# Patient Record
Sex: Male | Born: 1935
Health system: Southern US, Community
[De-identification: ages and names within clinical notes are randomized; demographics above are authoritative.]

## PROBLEM LIST (undated history)

## (undated) DIAGNOSIS — I10 Essential (primary) hypertension: Secondary | ICD-10-CM

## (undated) DIAGNOSIS — Z87442 Personal history of urinary calculi: Secondary | ICD-10-CM

## (undated) DIAGNOSIS — E782 Mixed hyperlipidemia: Secondary | ICD-10-CM

## (undated) DIAGNOSIS — Z45018 Encounter for adjustment and management of other part of cardiac pacemaker: Secondary | ICD-10-CM

## (undated) DIAGNOSIS — E785 Hyperlipidemia, unspecified: Secondary | ICD-10-CM

## (undated) DIAGNOSIS — R55 Syncope and collapse: Secondary | ICD-10-CM

## (undated) DIAGNOSIS — N189 Chronic kidney disease, unspecified: Secondary | ICD-10-CM

## (undated) DIAGNOSIS — R0989 Other specified symptoms and signs involving the circulatory and respiratory systems: Secondary | ICD-10-CM

## (undated) DIAGNOSIS — Z87891 Personal history of nicotine dependence: Secondary | ICD-10-CM

## (undated) DIAGNOSIS — I495 Sick sinus syndrome: Secondary | ICD-10-CM

## (undated) DIAGNOSIS — M109 Gout, unspecified: Secondary | ICD-10-CM

## (undated) DIAGNOSIS — Z8679 Personal history of other diseases of the circulatory system: Secondary | ICD-10-CM

## (undated) DIAGNOSIS — I5042 Chronic combined systolic (congestive) and diastolic (congestive) heart failure: Secondary | ICD-10-CM

## (undated) DIAGNOSIS — N402 Nodular prostate without lower urinary tract symptoms: Secondary | ICD-10-CM

## (undated) DIAGNOSIS — Z955 Presence of coronary angioplasty implant and graft: Secondary | ICD-10-CM

## (undated) DIAGNOSIS — I2510 Atherosclerotic heart disease of native coronary artery without angina pectoris: Secondary | ICD-10-CM

## (undated) DIAGNOSIS — F1011 Alcohol abuse, in remission: Secondary | ICD-10-CM

## (undated) DIAGNOSIS — K279 Peptic ulcer, site unspecified, unspecified as acute or chronic, without hemorrhage or perforation: Secondary | ICD-10-CM

## (undated) DIAGNOSIS — E039 Hypothyroidism, unspecified: Secondary | ICD-10-CM

## (undated) DIAGNOSIS — N400 Enlarged prostate without lower urinary tract symptoms: Secondary | ICD-10-CM

## (undated) DIAGNOSIS — E042 Nontoxic multinodular goiter: Secondary | ICD-10-CM

## (undated) DIAGNOSIS — D649 Anemia, unspecified: Secondary | ICD-10-CM

## (undated) HISTORY — DX: Hypothyroidism, unspecified: E03.9

## (undated) HISTORY — DX: Nodular prostate without lower urinary tract symptoms: N40.2

## (undated) HISTORY — PX: PENILE PROSTHESIS IMPLANT: SHX240

## (undated) HISTORY — PX: CORONARY ANGIOPLASTY: SHX604

## (undated) HISTORY — DX: Essential (primary) hypertension: I10

## (undated) HISTORY — DX: Encounter for adjustment and management of other part of cardiac pacemaker: Z45.018

## (undated) HISTORY — DX: Presence of coronary angioplasty implant and graft: Z95.5

## (undated) HISTORY — PX: THYROID SURGERY: SHX805

## (undated) HISTORY — DX: Anemia, unspecified: D64.9

## (undated) HISTORY — DX: Gout, unspecified: M10.9

## (undated) HISTORY — DX: Atherosclerotic heart disease of native coronary artery without angina pectoris: I25.10

## (undated) HISTORY — DX: Hyperlipidemia, unspecified: E78.5

## (undated) HISTORY — DX: Personal history of nicotine dependence: Z87.891

## (undated) HISTORY — DX: Chronic combined systolic (congestive) and diastolic (congestive) heart failure: I50.42

## (undated) HISTORY — DX: Mixed hyperlipidemia: E78.2

## (undated) HISTORY — DX: Peptic ulcer, site unspecified, unspecified as acute or chronic, without hemorrhage or perforation: K27.9

## (undated) HISTORY — PX: PACEMAKER IMPLANT: EP1218

## (undated) HISTORY — DX: Chronic kidney disease, unspecified: N18.9

## (undated) HISTORY — DX: Other specified symptoms and signs involving the circulatory and respiratory systems: R09.89

## (undated) HISTORY — DX: Personal history of urinary calculi: Z87.442

## (undated) HISTORY — DX: Personal history of other diseases of the circulatory system: Z86.79

## (undated) HISTORY — DX: Alcohol abuse, in remission: F10.11

## (undated) HISTORY — DX: Sick sinus syndrome: I49.5

## (undated) HISTORY — DX: Benign prostatic hyperplasia without lower urinary tract symptoms: N40.0

## (undated) HISTORY — DX: Syncope and collapse: R55

## (undated) HISTORY — DX: Nontoxic multinodular goiter: E04.2

---

## 2008-03-27 ENCOUNTER — Encounter: Admission: RE | Admit: 2008-03-27 | Discharge: 2008-03-27 | Payer: Self-pay | Admitting: Orthopaedic Surgery

## 2010-02-21 ENCOUNTER — Encounter: Payer: Self-pay | Admitting: Internal Medicine

## 2010-02-21 DIAGNOSIS — D649 Anemia, unspecified: Secondary | ICD-10-CM

## 2010-02-21 HISTORY — DX: Anemia, unspecified: D64.9

## 2010-02-24 ENCOUNTER — Ambulatory Visit: Payer: Self-pay | Admitting: Internal Medicine

## 2010-02-24 ENCOUNTER — Ambulatory Visit (HOSPITAL_COMMUNITY): Admission: RE | Admit: 2010-02-24 | Discharge: 2010-02-24 | Payer: Self-pay | Admitting: Internal Medicine

## 2010-02-25 ENCOUNTER — Encounter: Payer: Self-pay | Admitting: Internal Medicine

## 2010-04-02 ENCOUNTER — Ambulatory Visit: Payer: Self-pay | Admitting: Internal Medicine

## 2010-04-03 LAB — CONVERTED CEMR LAB
Basophils Relative: 0.7 % (ref 0.0–3.0)
Eosinophils Relative: 1.4 % (ref 0.0–5.0)
Hemoglobin: 12.6 g/dL — ABNORMAL LOW (ref 13.0–17.0)
Lymphocytes Relative: 20.5 % (ref 12.0–46.0)
MCHC: 34.5 g/dL (ref 30.0–36.0)
Monocytes Relative: 9.9 % (ref 3.0–12.0)
Neutro Abs: 3.9 10*3/uL (ref 1.4–7.7)
RBC: 4.19 M/uL — ABNORMAL LOW (ref 4.22–5.81)
WBC: 5.8 10*3/uL (ref 4.5–10.5)

## 2010-07-03 ENCOUNTER — Ambulatory Visit: Payer: Self-pay | Admitting: Internal Medicine

## 2010-07-08 ENCOUNTER — Telehealth: Payer: Self-pay | Admitting: Internal Medicine

## 2010-07-09 ENCOUNTER — Other Ambulatory Visit: Payer: Self-pay | Admitting: Internal Medicine

## 2010-07-09 LAB — CBC WITH DIFFERENTIAL/PLATELET
Basophils Absolute: 0 10*3/uL (ref 0.0–0.1)
Basophils Relative: 0.8 % (ref 0.0–3.0)
Eosinophils Absolute: 0.2 10*3/uL (ref 0.0–0.7)
Eosinophils Relative: 3 % (ref 0.0–5.0)
HCT: 33.7 % — ABNORMAL LOW (ref 39.0–52.0)
Hemoglobin: 11.5 g/dL — ABNORMAL LOW (ref 13.0–17.0)
Lymphocytes Relative: 18 % (ref 12.0–46.0)
Lymphs Abs: 1.1 10*3/uL (ref 0.7–4.0)
MCHC: 34.2 g/dL (ref 30.0–36.0)
MCV: 85.9 fl (ref 78.0–100.0)
Monocytes Absolute: 0.6 10*3/uL (ref 0.1–1.0)
Monocytes Relative: 10.1 % (ref 3.0–12.0)
Neutro Abs: 4 10*3/uL (ref 1.4–7.7)
Neutrophils Relative %: 68.1 % (ref 43.0–77.0)
Platelets: 187 10*3/uL (ref 150.0–400.0)
RBC: 3.92 Mil/uL — ABNORMAL LOW (ref 4.22–5.81)
RDW: 14.4 % (ref 11.5–14.6)
WBC: 5.9 10*3/uL (ref 4.5–10.5)

## 2010-07-09 LAB — GLUCOSE, RANDOM: Glucose, Bld: 87 mg/dL (ref 70–99)

## 2010-07-09 LAB — FERRITIN: Ferritin: 208.4 ng/mL (ref 22.0–322.0)

## 2010-07-09 LAB — POTASSIUM: Potassium: 4.5 mEq/L (ref 3.5–5.1)

## 2010-07-09 LAB — IBC PANEL
Iron: 74 ug/dL (ref 42–165)
Saturation Ratios: 22 % (ref 20.0–50.0)
Transferrin: 239.8 mg/dL (ref 212.0–360.0)

## 2010-07-10 LAB — MAGNESIUM: Magnesium: 1.6 mg/dL (ref 1.5–2.5)

## 2010-07-15 ENCOUNTER — Telehealth (INDEPENDENT_AMBULATORY_CARE_PROVIDER_SITE_OTHER): Payer: Self-pay | Admitting: *Deleted

## 2010-07-15 ENCOUNTER — Ambulatory Visit: Payer: Self-pay | Admitting: Oncology

## 2010-07-16 ENCOUNTER — Telehealth (INDEPENDENT_AMBULATORY_CARE_PROVIDER_SITE_OTHER): Payer: Self-pay | Admitting: *Deleted

## 2010-07-29 ENCOUNTER — Encounter: Payer: Self-pay | Admitting: Internal Medicine

## 2010-07-29 LAB — CBC WITH DIFFERENTIAL/PLATELET
BASO%: 0.7 % (ref 0.0–2.0)
Basophils Absolute: 0 10*3/uL (ref 0.0–0.1)
EOS%: 3.7 % (ref 0.0–7.0)
Eosinophils Absolute: 0.2 10*3/uL (ref 0.0–0.5)
HCT: 33.3 % — ABNORMAL LOW (ref 38.4–49.9)
HGB: 11.4 g/dL — ABNORMAL LOW (ref 13.0–17.1)
LYMPH%: 17.8 % (ref 14.0–49.0)
MCH: 29.1 pg (ref 27.2–33.4)
MCHC: 34.1 g/dL (ref 32.0–36.0)
MCV: 85.4 fL (ref 79.3–98.0)
MONO#: 0.5 10*3/uL (ref 0.1–0.9)
MONO%: 9.8 % (ref 0.0–14.0)
NEUT#: 3.5 10*3/uL (ref 1.5–6.5)
NEUT%: 68 % (ref 39.0–75.0)
Platelets: 180 10*3/uL (ref 140–400)
RBC: 3.9 10*6/uL — ABNORMAL LOW (ref 4.20–5.82)
RDW: 15.1 % — ABNORMAL HIGH (ref 11.0–14.6)
WBC: 5.1 10*3/uL (ref 4.0–10.3)
lymph#: 0.9 10*3/uL (ref 0.9–3.3)

## 2010-07-31 LAB — TESTOSTERONE: Testosterone: 269.91 ng/dL (ref 250–890)

## 2010-07-31 LAB — COMPREHENSIVE METABOLIC PANEL
ALT: 17 U/L (ref 0–53)
CO2: 23 mEq/L (ref 19–32)
Chloride: 104 mEq/L (ref 96–112)
Sodium: 140 mEq/L (ref 135–145)
Total Bilirubin: 0.5 mg/dL (ref 0.3–1.2)
Total Protein: 5.9 g/dL — ABNORMAL LOW (ref 6.0–8.3)

## 2010-07-31 LAB — SPEP & IFE WITH QIG
Albumin ELP: 60.1 % (ref 55.8–66.1)
Beta Globulin: 5.8 % (ref 4.7–7.2)
IgA: 180 mg/dL (ref 68–378)
Total Protein, Serum Electrophoresis: 5.9 g/dL — ABNORMAL LOW (ref 6.0–8.3)

## 2010-07-31 LAB — KAPPA/LAMBDA LIGHT CHAINS: Kappa free light chain: 1.47 mg/dL (ref 0.33–1.94)

## 2010-08-05 NOTE — Letter (Signed)
Summary: Patient Notice-Endo Biopsy Results  [facility]  [address] [place], [place] [address]   Phone: [phone]  Fax: [phone]        February 25, 2010 MRN: [id number]    Anthony Jensen [address] [place], [place]  [address]    Dear Mr. PAYETTE,  I am pleased to inform you that the biopsies taken during your recent endoscopic examination did not show any evidence of cancer upon pathologic examination.The biopsies of the small intestine showed normal villous pattern ( no evidence of villous atrophy/ sprue)  Additional information/recommendations:  __No further action is needed at this time.  Please follow-up with      your primary care physician for your other healthcare needs.  __ Please call [phone] to schedule a return visit to review      your condition.  _x_ Continue with the treatment plan as outlined on the day of your      exam.Continue to take the Iron and have Your CBC, Iron level and B12 level checked in  about 4-6 weeks.     Please call us if you are having persistent problems or have questions about your condition that have not been fully answered at this time.  Sincerely,  Hart Carwin MD  This letter has been electronically signed by your physician.  Appended Document: Patient Notice-Endo Biopsy Results letter mailed to patient's home

## 2010-08-05 NOTE — Procedures (Signed)
Summary: Colonoscopy  Patient: [name] Note: All result statuses are Final unless otherwise noted.  Tests: (1) Colonoscopy (COL)   COL Colonoscopy           DONE     [facility]     [address] [place], [place]  [address]           COLONOSCOPY PROCEDURE REPORT           PATIENT:  Anthony Jensen, Anthony Jensen  MR#:  [id number]     BIRTHDATE:  [birth year], 84 yrs. old  GENDER:  male     ENDOSCOPIST:  Hedwig Morton. Juanda Chance, MD     REF. BY:  Feliciana Rossetti, M.D.     PROCEDURE DATE:  02/24/2010     PROCEDURE:  Colonoscopy 19[address]     ASA CLASS:  Class II     INDICATIONS:  Anemia microcytic anemia     last colon 10 years ago,     MEDICATIONS:   Versed 4 mg, Fentanyl 87.5 mcg           DESCRIPTION OF PROCEDURE:   After the risks benefits and     alternatives of the procedure were thoroughly explained, informed     consent was obtained.  Digital rectal exam was performed and     revealed no rectal masses.   The  endoscope was introduced through     the anus and advanced to the cecum, which was identified by both     the appendix and ileocecal valve, without limitations.  The     quality of the prep was excellent, using MiraLax.  The instrument     was then slowly withdrawn as the colon was fully examined.     <<PROCEDUREIMAGES>>           FINDINGS:  Severe diverticulosis was found in the sigmoid colon     (see image1, image7, image6, and image8). severe spasm and     stenosis of the lumen, numerous diverticuli, difficult     exam,thickenrd haustral folds  Internal hemorrhoids were found     (see image9).  This was otherwise a normal examination of the     colon (see image5, image4, and image3).   Retroflexed views in the     rectum revealed no abnormalities.    The scope was then withdrawn     from the patient and the procedure completed.           COMPLICATIONS:  None     ENDOSCOPIC IMPRESSION:     1) Severe diverticulosis in the sigmoid colon     2) Internal hemorrhoids    3) Otherwise normal examination     nothing to0 account for the microcytic anemia     RECOMMENDATIONS:     1) high fiber diet     fiber supplements     Iron supplement x 4-6 weeks, then recheck CBC, Iron studies and     B12     REPEAT EXAM:  In 0 year(s) for.           ______________________________     Hedwig Morton. Juanda Chance, MD           CC:           n.     eSIGNED:   Hedwig Morton. Brodie at 02/24/2010 10:53 AM           Giovanni, Biby, [id number]  Note: An exclamation mark (!) indicates a result  that was not dispersed into the flowsheet. Document Creation Date: 02/24/2010 10:55 AM _______________________________________________________________________  (1) Order result status: Final Collection or observation date-time: 02/24/2010 10:45 Requested date-time:  Receipt date-time:  Reported date-time:  Referring Physician:   Ordering Physician: Lina Sar [id number]) Specimen Source:  Source: Launa Grill Order Number: [id number] Lab site:   Appended Document: Colonoscopy new lab orders entered for 03/31/10   Clinical Lists Changes  Medications: Added new medication of IRON 325 (65 FE) MG TABS (FERROUS SULFATE) 1 by mouth once daily

## 2010-08-05 NOTE — Procedures (Signed)
Summary: Upper Endoscopy  Patient: [name] Note: All result statuses are Final unless otherwise noted.  Tests: (1) Upper Endoscopy (EGD)   EGD Upper Endoscopy       DONE     [facility]     [address] [place], [place]  [address]           ENDOSCOPY PROCEDURE REPORT           PATIENT:  Anthony Jensen, Anthony Jensen  MR#:  [id number]     BIRTHDATE:  21-Feb-1926, 84 yrs. old  GENDER:  male           ENDOSCOPIST:  Hedwig Morton. Juanda Chance, MD     Referred by:  Feliciana Rossetti, M.D.           PROCEDURE DATE:  02/24/2010     PROCEDURE:  EGD with biopsy, Maloney Dilation of Esophagus     ASA CLASS:  Class II     INDICATIONS:  anemia microcytic anemia Hgb 11.4, s/p UGIB 07/2009     due to DU ( NSAID's)           MEDICATIONS:   Versed 3 mg, Fentanyl 37.5 mcg     TOPICAL ANESTHETIC:  Cetacaine Spray           DESCRIPTION OF PROCEDURE:   After the risks benefits and     alternatives of the procedure were thoroughly explained, informed     consent was obtained.  The  endoscope was introduced through the     mouth and advanced to the second portion of the duodenum, without     limitations.  The instrument was slowly withdrawn as the mucosa     was fully examined.     <<PROCEDUREIMAGES>>           A hiatal hernia was found. 1-2 cm small reducible hiatal hernia  A     stricture was found in the distal esophagus (see image1 and     image7). mild benign appearing fibrous stricture, admitted scope     without resistance maloney dilator 54F passed without difficulty     Otherwise the examination was normal. fundic gland polyps With     standard forceps, a biopsy was obtained and sent to pathology (see     image2, image3, image4, image5, and image6). small bowl biopsy to     r/o sprue    Retroflexed views revealed no abnormalities.    The     scope was then withdrawn from the patient and the procedure     completed.           COMPLICATIONS:  None           ENDOSCOPIC IMPRESSION:     1)  Hiatal hernia     2) Stricture in the distal esophagus     3) Otherwise normal examination     s/p passage of 54F Maloney dilator     RECOMMENDATIONS:     1) Await biopsy results     continue Prelosec prn           REPEAT EXAM:  In 0 year(s) for.           ______________________________     Hedwig Morton. Juanda Chance, MD           CC:           n.     eSIGNED:   Hedwig Morton. Brodie at 02/24/2010 10:44 AM  Baby, Stairs, [id number]  Note: An exclamation mark (!) indicates a result that was not dispersed into the flowsheet. Document Creation Date: 02/24/2010 10:46 AM _______________________________________________________________________  (1) Order result status: Final Collection or observation date-time: 02/24/2010 10:03 Requested date-time:  Receipt date-time:  Reported date-time:  Referring Physician:   Ordering Physician: Lina Sar [phone]) Specimen Source:  Source: Launa Grill Order Number: [id number] Lab site:

## 2010-08-05 NOTE — Procedures (Signed)
Summary: Instruction for procedure/[place]  Instruction for procedure/[place]   Imported By: Sherian Rein 02/25/2010 08:54:04  _____________________________________________________________________  External Attachment:    Type:   Image     Comment:   External Document

## 2010-08-05 NOTE — Letter (Signed)
Summary: [facility] Instructions  [facility]  [address] [place], [place] [address]   Phone: [phone]  Fax: [phone]       Anthony Jensen    1926/01/19    MRN: [id number]       Procedure Day /Date: Monday 02/24/10     Arrival Time: 8:30 am     Procedure Time: 9:30 am     Location of Procedure:                     _ x_  [facility] ( Outpatient Registration)  PREPARATION FOR COLONOSCOPY WITH MIRALAX  Starting 5 days prior to your procedure (beginning today) do not eat nuts, seeds, popcorn, corn, beans, peas,  salads, or any raw vegetables.  Do not take any fiber supplements (e.g. Metamucil, Citrucel, and Benefiber). ____________________________________________________________________________________________________   THE DAY BEFORE YOUR PROCEDURE         DATE: Sunday DAY:  02/23/10  1   Drink clear liquids the entire day-NO SOLID FOOD  2   Do not drink anything colored red or purple.  Avoid juices with pulp.  No orange juice.  3   Drink at least 64 oz. (8 glasses) of fluid/clear liquids during the day to prevent dehydration and help the prep work efficiently.  CLEAR LIQUIDS INCLUDE: Water Jello Ice Popsicles Tea (sugar ok, no milk/cream) Powdered fruit flavored drinks Coffee (sugar ok, no milk/cream) Gatorade Juice: apple, white grape, white cranberry  Lemonade Clear bullion, consomm, broth Carbonated beverages (any kind) Strained chicken noodle soup Hard Candy  4   Mix the entire bottle of Miralax with 64 oz. of Gatorade/Powerade in the morning and put in the refrigerator to chill.  5   At 3:00 pm take 2 Dulcolax/Bisacodyl tablets.  6   At 4:30 pm take one Reglan/Metoclopramide tablet.  7  Starting at 5:00 pm drink one 8 oz glass of the Miralax mixture every 15-20 minutes until you have finished drinking the entire 64 oz.  You should finish drinking prep around 7:30 or 8:00 pm.  8   If you are nauseated, you may take the 2nd  Reglan/Metoclopramide tablet at 6:30 pm.        9    At 8:00 pm take 2 more DULCOLAX/Bisacodyl tablets.         THE DAY OF YOUR PROCEDURE      DATE:  02/24/10 DAY: Monday  You may drink clear liquids until 5:30 am  (4 HOURS BEFORE PROCEDURE).   MEDICATION INSTRUCTIONS  Unless otherwise instructed, you should take regular prescription medications with a small sip of water as early as possible the morning of your procedure.  Discontinue Plavix beginning today per Dr Lina Sar       OTHER INSTRUCTIONS  You will need a responsible adult at least 75 years of age to accompany you and drive you home.   This person must remain in the waiting room during your procedure.  Wear loose fitting clothing that is easily removed.  Leave jewelry and other valuables at home.  However, you may wish to bring a book to read or an iPod/MP3 player to listen to music as you wait for your procedure to start.  Remove all body piercing jewelry and leave at home.  Total time from sign-in until discharge is approximately 2-3 hours.  You should go home directly after your procedure and rest.  You can resume normal activities the day after your procedure.  The  day of your procedure you should not:   Drive   Make legal decisions   Operate machinery   Drink alcohol   Return to work  You will receive specific instructions about eating, activities and medications before you leave.   The above instructions have been reviewed and explained to me by   Lamona Curl CMA Duncan Dull)  February 21, 2010 3:06 PM     I fully understand and can verbalize these instructions _____________________________ Date 02/21/10

## 2010-08-05 NOTE — Miscellaneous (Signed)
Summary: Meds  Clinical Lists Changes  Problems: Added new problem of ANEMIA (ICD-285.9) Medications: Added new medication of MIRALAX   POWD (POLYETHYLENE GLYCOL 3350) As per prep  instructions. - Signed Added new medication of REGLAN 10 MG  TABS (METOCLOPRAMIDE HCL) As per prep instructions. - Signed Added new medication of DULCOLAX 5 MG  TBEC (BISACODYL) Day before procedure take 2 at 3pm and 2 at 8pm. - Signed Rx of MIRALAX   POWD (POLYETHYLENE GLYCOL 3350) As per prep  instructions.;  #255gm x 0;  Signed;  Entered by: Lamona Curl CMA (AAMA);  Authorized by: Hart Carwin MD;  Method used: Electronically to [facility] [address] [place], [place], [place]  [id number], Ph: [phone], Fax: [phone] Rx of REGLAN 10 MG  TABS (METOCLOPRAMIDE HCL) As per prep instructions.;  #2 x 0;  Signed;  Entered by: Lamona Curl CMA (AAMA);  Authorized by: Hart Carwin MD;  Method used: Electronically to [facility] [address] [place], [place], [place]  [id number], Ph: [phone], Fax: [phone] Rx of DULCOLAX 5 MG  TBEC (BISACODYL) Day before procedure take 2 at 3pm and 2 at 8pm.;  #4 x 0;  Signed;  Entered by: Lamona Curl CMA (AAMA);  Authorized by: Hart Carwin MD;  Method used: Electronically to [facility] [address] [place], [place], [place]  [id number], Ph: [phone], Fax: [phone] Orders: Added new Test order of [facility] (Col/[facility] [place]) - Signed    Prescriptions: DULCOLAX 5 MG  TBEC (BISACODYL) Day before procedure take 2 at 3pm and 2 at 8pm.  #4 x 0   Entered by:   Lamona Curl CMA (AAMA)   Authorized by:   Hart Carwin MD   Signed by:   Lamona Curl CMA (AAMA) on 02/21/2010   Method used:   Electronically to        [facility] (retail)       [address]       [place], [place]  [id number]       Ph:  [phone]       Fax: [phone]   RxID:   [id number] REGLAN 10 MG  TABS (METOCLOPRAMIDE HCL) As per prep instructions.  #2 x 0   Entered by:   Lamona Curl CMA (AAMA)   Authorized by:   Hart Carwin MD   Signed by:   Lamona Curl CMA (AAMA) on 02/21/2010   Method used:   Electronically to        [facility] (retail)       [address]       [place], [place]  [address]       Ph: [phone]       Fax: [phone]   RxID:   [id number] MIRALAX   POWD (POLYETHYLENE GLYCOL 3350) As per prep  instructions.  #255gm x 0   Entered by:   Lamona Curl CMA (AAMA)   Authorized by:   Hart Carwin MD   Signed by:   Lamona Curl CMA (AAMA) on 02/21/2010   Method used:   Electronically to        [facility] (retail)       [address]       [place], [place]  [id number]       Ph: [phone]       Fax: [phone]   RxID:   [id number]

## 2010-08-07 NOTE — Progress Notes (Signed)
Summary: Labs  ---- Converted from flag ---- ---- 07/08/2010 8:15 AM, Lamona Curl CMA (AAMA) wrote: Dr Juanda Chance- I called patient to remind him to have a repeat h & h drawn.Marland Kitchen..Marland Kitchenwife requests that we add a magnesium level and glucose level as it was high at a recent appointment @ [facility]....are you okay with me going ahead and ordering the magnesium and glucose? ------------------------------ ---- 07/08/2010 8:48 AM, Hart Carwin MD wrote: yes it is OK  Appended Document: Labs Shanna from the lab called and said the pt wanted to add a Potassium draw to his labs.  I asked Dr. Juanda Chance and she said OK, she stated she already said ok for additional testing.

## 2010-08-07 NOTE — Progress Notes (Signed)
----   Converted from flag ---- ---- 07/12/2010 7:44 PM, Hart Carwin MD wrote: Rene Kocher, please forward my notes to Dr Arnoldo Lenis , especially all blood test results. Thanx ------------------------------  Phone Note Outgoing Call   Call placed by: Jesse Fall, RN Call placed to: [facility] Center Summary of Call: Spoke with [facility]. They have received the referral on patient and his medical records with labs. Dr. Truett Perna is reviewing and they will let us know when the patient is scheduled. Initial call taken by: Jesse Fall RN,  July 15, 2010 11:01 AM

## 2010-08-07 NOTE — Progress Notes (Signed)
  Phone Note Other Incoming   Caller: Chrys Racer at [facility] Summary of Call: Patient has been scheduled with Dr. Truett Perna on 07/29/10 at 1:30 PM.  Initial call taken by: Jesse Fall RN,  July 16, 2010 12:11 PM

## 2010-09-02 NOTE — Letter (Signed)
Summary: [facility]  [facility]   Imported By: Sherian Rein 08/25/2010 07:51:13  _____________________________________________________________________  External Attachment:    Type:   Image     Comment:   External Document

## 2010-11-07 ENCOUNTER — Other Ambulatory Visit: Payer: Self-pay | Admitting: Oncology

## 2010-11-07 ENCOUNTER — Encounter (HOSPITAL_BASED_OUTPATIENT_CLINIC_OR_DEPARTMENT_OTHER): Payer: Medicare Other | Admitting: Oncology

## 2010-11-07 DIAGNOSIS — N289 Disorder of kidney and ureter, unspecified: Secondary | ICD-10-CM

## 2010-11-07 DIAGNOSIS — D649 Anemia, unspecified: Secondary | ICD-10-CM

## 2010-11-07 LAB — CBC WITH DIFFERENTIAL/PLATELET
BASO%: 0.8 % (ref 0.0–2.0)
Basophils Absolute: 0 10*3/uL (ref 0.0–0.1)
EOS%: 3.4 % (ref 0.0–7.0)
HGB: 11.2 g/dL — ABNORMAL LOW (ref 13.0–17.1)
MCH: 28.4 pg (ref 27.2–33.4)
MCHC: 34.1 g/dL (ref 32.0–36.0)
MCV: 83.4 fL (ref 79.3–98.0)
MONO%: 9.3 % (ref 0.0–14.0)
RBC: 3.95 10*6/uL — ABNORMAL LOW (ref 4.20–5.82)
RDW: 14.4 % (ref 11.0–14.6)
lymph#: 1.2 10*3/uL (ref 0.9–3.3)

## 2010-12-11 ENCOUNTER — Encounter (HOSPITAL_BASED_OUTPATIENT_CLINIC_OR_DEPARTMENT_OTHER): Payer: Medicare Other | Admitting: Oncology

## 2010-12-11 ENCOUNTER — Other Ambulatory Visit: Payer: Self-pay | Admitting: Oncology

## 2010-12-11 DIAGNOSIS — D649 Anemia, unspecified: Secondary | ICD-10-CM

## 2010-12-11 LAB — CBC & DIFF AND RETIC
BASO%: 0.3 % (ref 0.0–2.0)
Basophils Absolute: 0 10e3/uL (ref 0.0–0.1)
EOS%: 2.6 % (ref 0.0–7.0)
Eosinophils Absolute: 0.2 10e3/uL (ref 0.0–0.5)
HCT: 29.2 % — ABNORMAL LOW (ref 38.4–49.9)
HGB: 9.8 g/dL — ABNORMAL LOW (ref 13.0–17.1)
Immature Retic Fract: 3.6 % (ref 0.00–13.40)
LYMPH%: 22.5 % (ref 14.0–49.0)
MCH: 27.5 pg (ref 27.2–33.4)
MCHC: 33.6 g/dL (ref 32.0–36.0)
MCV: 82 fL (ref 79.3–98.0)
MONO#: 0.5 10e3/uL (ref 0.1–0.9)
MONO%: 7.7 % (ref 0.0–14.0)
NEUT#: 4.6 10e3/uL (ref 1.5–6.5)
NEUT%: 66.9 % (ref 39.0–75.0)
Platelets: 140 10e3/uL (ref 140–400)
RBC: 3.56 10e6/uL — ABNORMAL LOW (ref 4.20–5.82)
RDW: 13.9 % (ref 11.0–14.6)
Retic %: 0.92 % (ref 0.50–1.60)
Retic Ct Abs: 32.75 10e3/uL (ref 24.10–77.50)
WBC: 6.8 10e3/uL (ref 4.0–10.3)
lymph#: 1.5 10e3/uL (ref 0.9–3.3)
nRBC: 0 % (ref 0–0)

## 2010-12-11 LAB — CHCC SMEAR

## 2010-12-17 ENCOUNTER — Encounter (HOSPITAL_BASED_OUTPATIENT_CLINIC_OR_DEPARTMENT_OTHER): Payer: Medicare Other | Admitting: Oncology

## 2010-12-17 DIAGNOSIS — N289 Disorder of kidney and ureter, unspecified: Secondary | ICD-10-CM

## 2010-12-17 DIAGNOSIS — D649 Anemia, unspecified: Secondary | ICD-10-CM

## 2010-12-18 LAB — COMPREHENSIVE METABOLIC PANEL
Alkaline Phosphatase: 98 U/L (ref 39–117)
CO2: 26 mEq/L (ref 19–32)
Creatinine, Ser: 1.97 mg/dL — ABNORMAL HIGH (ref 0.50–1.35)
Glucose, Bld: 128 mg/dL — ABNORMAL HIGH (ref 70–99)
Sodium: 136 mEq/L (ref 135–145)
Total Bilirubin: 0.4 mg/dL (ref 0.3–1.2)

## 2010-12-18 LAB — FERRITIN: Ferritin: 389 ng/mL — ABNORMAL HIGH (ref 22–322)

## 2010-12-18 LAB — LACTATE DEHYDROGENASE: LDH: 158 U/L (ref 94–250)

## 2010-12-30 ENCOUNTER — Other Ambulatory Visit: Payer: Self-pay | Admitting: Oncology

## 2011-01-02 ENCOUNTER — Ambulatory Visit (HOSPITAL_COMMUNITY)
Admission: RE | Admit: 2011-01-02 | Discharge: 2011-01-02 | Disposition: A | Discharge status: Home / Self Care | Payer: Medicare Other | Source: Ambulatory Visit | Attending: Oncology | Admitting: Oncology

## 2011-01-02 DIAGNOSIS — N281 Cyst of kidney, acquired: Secondary | ICD-10-CM | POA: Insufficient documentation

## 2011-01-02 DIAGNOSIS — R319 Hematuria, unspecified: Secondary | ICD-10-CM | POA: Insufficient documentation

## 2011-01-02 DIAGNOSIS — R300 Dysuria: Secondary | ICD-10-CM | POA: Insufficient documentation

## 2011-01-08 ENCOUNTER — Other Ambulatory Visit: Payer: Self-pay | Admitting: Oncology

## 2011-01-08 ENCOUNTER — Encounter (HOSPITAL_BASED_OUTPATIENT_CLINIC_OR_DEPARTMENT_OTHER): Payer: Medicare Other | Admitting: Oncology

## 2011-01-08 DIAGNOSIS — N289 Disorder of kidney and ureter, unspecified: Secondary | ICD-10-CM

## 2011-01-08 DIAGNOSIS — D649 Anemia, unspecified: Secondary | ICD-10-CM

## 2011-01-08 LAB — CBC WITH DIFFERENTIAL/PLATELET
BASO%: 0.5 % (ref 0.0–2.0)
EOS%: 3.2 % (ref 0.0–7.0)
HCT: 39 % (ref 38.4–49.9)
LYMPH%: 17.1 % (ref 14.0–49.0)
MCH: 27.9 pg (ref 27.2–33.4)
MCHC: 32.6 g/dL (ref 32.0–36.0)
MONO%: 13.1 % (ref 0.0–14.0)
NEUT%: 66.1 % (ref 39.0–75.0)
Platelets: 156 10*3/uL (ref 140–400)

## 2011-01-09 ENCOUNTER — Other Ambulatory Visit (HOSPITAL_COMMUNITY): Payer: Medicare Other

## 2011-01-22 ENCOUNTER — Other Ambulatory Visit: Payer: Self-pay | Admitting: Oncology

## 2011-01-22 ENCOUNTER — Encounter (HOSPITAL_BASED_OUTPATIENT_CLINIC_OR_DEPARTMENT_OTHER): Payer: Medicare Other | Admitting: Oncology

## 2011-01-22 DIAGNOSIS — D649 Anemia, unspecified: Secondary | ICD-10-CM

## 2011-01-22 LAB — CBC WITH DIFFERENTIAL/PLATELET
BASO%: 0.7 % (ref 0.0–2.0)
EOS%: 2.7 % (ref 0.0–7.0)
MCH: 28 pg (ref 27.2–33.4)
MCHC: 33.1 g/dL (ref 32.0–36.0)
NEUT%: 56.3 % (ref 39.0–75.0)
RBC: 4.48 10*6/uL (ref 4.20–5.82)
RDW: 15.4 % — ABNORMAL HIGH (ref 11.0–14.6)
lymph#: 1.6 10*3/uL (ref 0.9–3.3)

## 2011-02-05 ENCOUNTER — Other Ambulatory Visit: Payer: Self-pay | Admitting: Oncology

## 2011-02-05 ENCOUNTER — Encounter (HOSPITAL_BASED_OUTPATIENT_CLINIC_OR_DEPARTMENT_OTHER): Payer: Medicare Other | Admitting: Oncology

## 2011-02-05 DIAGNOSIS — D649 Anemia, unspecified: Secondary | ICD-10-CM

## 2011-02-05 LAB — CBC WITH DIFFERENTIAL/PLATELET
BASO%: 1.1 % (ref 0.0–2.0)
Basophils Absolute: 0.1 10*3/uL (ref 0.0–0.1)
EOS%: 4.8 % (ref 0.0–7.0)
HGB: 12 g/dL — ABNORMAL LOW (ref 13.0–17.1)
MCH: 27.1 pg — ABNORMAL LOW (ref 27.2–33.4)
MCHC: 33.1 g/dL (ref 32.0–36.0)
MCV: 81.9 fL (ref 79.3–98.0)
MONO%: 11.4 % (ref 0.0–14.0)
RDW: 14.6 % (ref 11.0–14.6)

## 2011-02-19 ENCOUNTER — Other Ambulatory Visit: Payer: Self-pay | Admitting: Oncology

## 2011-02-19 ENCOUNTER — Encounter (HOSPITAL_BASED_OUTPATIENT_CLINIC_OR_DEPARTMENT_OTHER): Payer: Medicare Other | Admitting: Oncology

## 2011-02-19 DIAGNOSIS — D649 Anemia, unspecified: Secondary | ICD-10-CM

## 2011-02-19 LAB — CBC WITH DIFFERENTIAL/PLATELET
BASO%: 0.3 % (ref 0.0–2.0)
LYMPH%: 18.8 % (ref 14.0–49.0)
MCHC: 33.8 g/dL (ref 32.0–36.0)
MCV: 80.5 fL (ref 79.3–98.0)
MONO#: 0.7 10*3/uL (ref 0.1–0.9)
MONO%: 9.6 % (ref 0.0–14.0)
Platelets: 176 10*3/uL (ref 140–400)
RBC: 4.26 10*6/uL (ref 4.20–5.82)
WBC: 7.1 10*3/uL (ref 4.0–10.3)

## 2011-03-05 ENCOUNTER — Other Ambulatory Visit: Payer: Self-pay | Admitting: Oncology

## 2011-03-05 ENCOUNTER — Encounter (HOSPITAL_BASED_OUTPATIENT_CLINIC_OR_DEPARTMENT_OTHER): Payer: Medicare Other | Admitting: Oncology

## 2011-03-05 DIAGNOSIS — N289 Disorder of kidney and ureter, unspecified: Secondary | ICD-10-CM

## 2011-03-05 DIAGNOSIS — D649 Anemia, unspecified: Secondary | ICD-10-CM

## 2011-03-05 LAB — CBC WITH DIFFERENTIAL/PLATELET
BASO%: 0.7 % (ref 0.0–2.0)
MCHC: 34.3 g/dL (ref 32.0–36.0)
MONO#: 0.6 10*3/uL (ref 0.1–0.9)
RBC: 4.36 10*6/uL (ref 4.20–5.82)
WBC: 5.7 10*3/uL (ref 4.0–10.3)
lymph#: 1.1 10*3/uL (ref 0.9–3.3)

## 2011-03-05 LAB — IRON AND TIBC
%SAT: 31 % (ref 20–55)
Iron: 87 ug/dL (ref 42–165)
TIBC: 279 ug/dL (ref 215–435)
UIBC: 192 ug/dL (ref 125–400)

## 2011-05-07 ENCOUNTER — Other Ambulatory Visit: Payer: Self-pay | Admitting: Oncology

## 2011-05-07 ENCOUNTER — Encounter (HOSPITAL_BASED_OUTPATIENT_CLINIC_OR_DEPARTMENT_OTHER): Payer: Medicare Other | Admitting: Oncology

## 2011-05-07 DIAGNOSIS — D649 Anemia, unspecified: Secondary | ICD-10-CM

## 2011-05-07 DIAGNOSIS — N289 Disorder of kidney and ureter, unspecified: Secondary | ICD-10-CM

## 2011-05-07 LAB — CBC WITH DIFFERENTIAL/PLATELET
Eosinophils Absolute: 0.1 10*3/uL (ref 0.0–0.5)
LYMPH%: 16.6 % (ref 14.0–49.0)
MONO#: 0.3 10*3/uL (ref 0.1–0.9)
NEUT#: 4.9 10*3/uL (ref 1.5–6.5)
Platelets: 182 10*3/uL (ref 140–400)
RBC: 3.54 10*6/uL — ABNORMAL LOW (ref 4.20–5.82)
RDW: 15.9 % — ABNORMAL HIGH (ref 11.0–14.6)
WBC: 6.5 10*3/uL (ref 4.0–10.3)

## 2011-05-07 LAB — IRON AND TIBC
%SAT: 38 % (ref 20–55)
Iron: 115 ug/dL (ref 42–165)
UIBC: 184 ug/dL (ref 125–400)

## 2011-05-07 LAB — FERRITIN: Ferritin: 408 ng/mL — ABNORMAL HIGH (ref 22–322)

## 2011-06-02 ENCOUNTER — Telehealth: Payer: Self-pay | Admitting: Internal Medicine

## 2011-06-02 ENCOUNTER — Other Ambulatory Visit: Payer: Self-pay | Admitting: Internal Medicine

## 2011-06-02 NOTE — Telephone Encounter (Signed)
I have spoken to patient's wife (patient was unable to come to phone) to advise that patient does not need iron supplements at this time since his iron saturation is up to 38%. She states that she will tell him and knows to call back with questions.

## 2011-06-02 NOTE — Telephone Encounter (Signed)
Message copied by Richardson Chiquito on Tue Jun 02, 2011  4:54 PM ------      Message from: Hart Carwin      Created: Tue Jun 02, 2011  4:41 PM      Regarding: iron supplements       Last Iron sat 38% on 11/1. Please call pt there is no need to continue the iron.      ----- Message -----         From: Vernia Buff, CMA         Sent: 06/02/2011   1:36 PM           To: Hart Carwin, MD            Dr Juanda Chance-      I have a request from Dr [facility] pharmacy for patient to get iron refills... Will you look at his last labs and tell me if it is okay to refill at once daily dosing

## 2011-07-02 ENCOUNTER — Other Ambulatory Visit: Payer: Medicare Other | Admitting: Lab

## 2011-07-06 ENCOUNTER — Other Ambulatory Visit (HOSPITAL_BASED_OUTPATIENT_CLINIC_OR_DEPARTMENT_OTHER): Payer: Medicare Other | Admitting: Lab

## 2011-07-06 ENCOUNTER — Other Ambulatory Visit: Payer: Self-pay | Admitting: *Deleted

## 2011-07-06 DIAGNOSIS — D649 Anemia, unspecified: Secondary | ICD-10-CM

## 2011-07-06 LAB — BASIC METABOLIC PANEL
BUN: 35 mg/dL — ABNORMAL HIGH (ref 6–23)
CO2: 27 mEq/L (ref 19–32)
Calcium: 9.7 mg/dL (ref 8.4–10.5)
Glucose, Bld: 100 mg/dL — ABNORMAL HIGH (ref 70–99)
Potassium: 4.7 mEq/L (ref 3.5–5.3)
Sodium: 134 mEq/L — ABNORMAL LOW (ref 135–145)

## 2011-07-06 LAB — CBC WITH DIFFERENTIAL/PLATELET
Basophils Absolute: 0 10*3/uL (ref 0.0–0.1)
Eosinophils Absolute: 0.4 10*3/uL (ref 0.0–0.5)
HGB: 12 g/dL — ABNORMAL LOW (ref 13.0–17.1)
LYMPH%: 26.6 % (ref 14.0–49.0)
MCV: 85.6 fL (ref 79.3–98.0)
MONO#: 0.5 10*3/uL (ref 0.1–0.9)
MONO%: 10.2 % (ref 0.0–14.0)
NEUT#: 2.7 10*3/uL (ref 1.5–6.5)
Platelets: 167 10*3/uL (ref 140–400)
RBC: 4.11 10*6/uL — ABNORMAL LOW (ref 4.20–5.82)
WBC: 5 10*3/uL (ref 4.0–10.3)

## 2011-07-08 ENCOUNTER — Telehealth: Payer: Self-pay | Admitting: *Deleted

## 2011-07-08 NOTE — Telephone Encounter (Signed)
Message copied by Wandalee Ferdinand on Wed Jul 08, 2011  6:29 PM ------      Message from: Thornton Papas B      Created: Wed Jul 08, 2011 10:05 AM       Please call patient, hb ok, creatinine slightly higher, f/u as scheduled.  Copy bmet to Dr. Eliott Nine,

## 2011-07-08 NOTE — Telephone Encounter (Signed)
Left message for neighbor who was staying at home to let patient know I called. Will call again tomorrow.

## 2011-07-25 ENCOUNTER — Telehealth: Payer: Self-pay | Admitting: Oncology

## 2011-07-25 NOTE — Telephone Encounter (Signed)
S/w the pt and he is aware of his march 2013 appts 

## 2011-08-03 DIAGNOSIS — I2510 Atherosclerotic heart disease of native coronary artery without angina pectoris: Secondary | ICD-10-CM | POA: Diagnosis not present

## 2011-08-03 DIAGNOSIS — K219 Gastro-esophageal reflux disease without esophagitis: Secondary | ICD-10-CM | POA: Diagnosis not present

## 2011-08-03 DIAGNOSIS — I10 Essential (primary) hypertension: Secondary | ICD-10-CM | POA: Diagnosis not present

## 2011-08-03 DIAGNOSIS — R0602 Shortness of breath: Secondary | ICD-10-CM | POA: Diagnosis not present

## 2011-08-05 ENCOUNTER — Telehealth: Payer: Self-pay | Admitting: *Deleted

## 2011-08-05 DIAGNOSIS — I2510 Atherosclerotic heart disease of native coronary artery without angina pectoris: Secondary | ICD-10-CM | POA: Diagnosis not present

## 2011-08-05 DIAGNOSIS — R5381 Other malaise: Secondary | ICD-10-CM | POA: Diagnosis not present

## 2011-08-05 DIAGNOSIS — R0602 Shortness of breath: Secondary | ICD-10-CM | POA: Diagnosis not present

## 2011-08-05 DIAGNOSIS — I10 Essential (primary) hypertension: Secondary | ICD-10-CM | POA: Diagnosis not present

## 2011-08-05 DIAGNOSIS — E782 Mixed hyperlipidemia: Secondary | ICD-10-CM | POA: Diagnosis not present

## 2011-08-05 NOTE — Telephone Encounter (Signed)
Reports feeling weak, tired, dyspnea with minimal exertion. Saw cardiologist this week and had labs checked--BNP was normal. Having ECHO tomorrow. TSH results pending. Hgb 11.0/ Hct 32.0--asking if he could have Epo injection for this?   Also reports that his wife died on 07-26-2011.

## 2011-08-05 NOTE — Telephone Encounter (Signed)
I would not give epo. With hb at this level

## 2011-08-06 DIAGNOSIS — I2510 Atherosclerotic heart disease of native coronary artery without angina pectoris: Secondary | ICD-10-CM | POA: Diagnosis not present

## 2011-08-06 DIAGNOSIS — I119 Hypertensive heart disease without heart failure: Secondary | ICD-10-CM | POA: Diagnosis not present

## 2011-08-06 NOTE — Telephone Encounter (Signed)
Patient notified that MD would not give Epo with Hgb at 11

## 2011-08-07 DIAGNOSIS — I129 Hypertensive chronic kidney disease with stage 1 through stage 4 chronic kidney disease, or unspecified chronic kidney disease: Secondary | ICD-10-CM | POA: Diagnosis not present

## 2011-09-01 DIAGNOSIS — I10 Essential (primary) hypertension: Secondary | ICD-10-CM | POA: Diagnosis not present

## 2011-09-01 DIAGNOSIS — R0602 Shortness of breath: Secondary | ICD-10-CM | POA: Diagnosis not present

## 2011-09-01 DIAGNOSIS — K219 Gastro-esophageal reflux disease without esophagitis: Secondary | ICD-10-CM | POA: Diagnosis not present

## 2011-09-01 DIAGNOSIS — I2510 Atherosclerotic heart disease of native coronary artery without angina pectoris: Secondary | ICD-10-CM | POA: Diagnosis not present

## 2011-09-18 ENCOUNTER — Other Ambulatory Visit (HOSPITAL_BASED_OUTPATIENT_CLINIC_OR_DEPARTMENT_OTHER): Payer: Medicare Other

## 2011-09-18 ENCOUNTER — Ambulatory Visit (HOSPITAL_BASED_OUTPATIENT_CLINIC_OR_DEPARTMENT_OTHER): Payer: Medicare Other | Admitting: Oncology

## 2011-09-18 VITALS — BP 121/60 | HR 63 | Temp 97.2°F | Ht 65.5 in | Wt 155.3 lb

## 2011-09-18 DIAGNOSIS — D649 Anemia, unspecified: Secondary | ICD-10-CM | POA: Diagnosis not present

## 2011-09-18 DIAGNOSIS — N289 Disorder of kidney and ureter, unspecified: Secondary | ICD-10-CM | POA: Diagnosis not present

## 2011-09-18 LAB — CBC WITH DIFFERENTIAL/PLATELET
BASO%: 1.1 % (ref 0.0–2.0)
EOS%: 5 % (ref 0.0–7.0)
HCT: 36.6 % — ABNORMAL LOW (ref 38.4–49.9)
LYMPH%: 19.5 % (ref 14.0–49.0)
MCH: 29.9 pg (ref 27.2–33.4)
MCHC: 33.7 g/dL (ref 32.0–36.0)
NEUT%: 65.5 % (ref 39.0–75.0)
Platelets: 175 10*3/uL (ref 140–400)
RBC: 4.12 10*6/uL — ABNORMAL LOW (ref 4.20–5.82)

## 2011-09-18 LAB — FERRITIN: Ferritin: 307 ng/mL (ref 22–322)

## 2011-09-18 LAB — IRON AND TIBC
%SAT: 28 % (ref 20–55)
Iron: 79 ug/dL (ref 42–165)
UIBC: 206 ug/dL (ref 125–400)

## 2011-09-18 NOTE — Progress Notes (Signed)
OFFICE PROGRESS NOTE   INTERVAL HISTORY:   He returns as scheduled. He has not received erythropoietin in many months. He reports feeling well. He denies shortness of breath. Shortness of breath improved when he was switched to a different beta blocker.  He reports the creatinine has improved and he is no longer seeing Dr. Eliott Nine.  Dr. Providence Crosby wife died several months ago. He has been drinking alcohol since this time. He fell yesterday and developed ecchymoses and skin tears.  He continues to golf weekly.  Objective:  Vital signs in last 24 hours:  Blood pressure 121/60, pulse 63, temperature 97.2 F (36.2 C), temperature source Oral, height 5' 5.5" (1.664 m), weight 155 lb 4.8 oz (70.444 kg).   Resp: Lungs clear bilaterally Cardio: Regular rate and rhythm GI: No hepatosplenomegaly Vascular: No leg edema Neuro: Alert and oriented  Skin: Scattered ecchymoses over the arms and at the right lower back   Lab Results:  Lab Results  Component Value Date   WBC 5.2 09/18/2011   HGB 12.3* 09/18/2011   HCT 36.6* 09/18/2011   MCV 88.8 09/18/2011   PLT 175 09/18/2011   ANC 3.4  Hemoglobin 12.0 on 07/06/2011    Medications: I have reviewed the patient's current medications.  Assessment/Plan: 1. Normocytic anemia improved with erythropoietin/iron therapy-he last received Aranesp in November of 2012. 2. Renal insufficiency -he was evaluated by Dr. Eliott Nine 3. History of coronary artery disease. 4. History of hypertension. 5. Upper gastrointestinal bleeding in early 2011. 6. Status post negative upper and lower endoscopies in August 2011. 7. Exertional dyspnea improved with treatment of anemia. He reports recent improvement in dyspnea when he was changed to a different beta blocker   Disposition:  The hemoglobin is adequate at present. He has not received erythropoietin therapy a greater than 4 months. He plans to check his hemoglobin frequently. He does not wish to schedule a  followup appointment in the hematology clinic. He will contact me for a hemoglobin of less than 11 or symptoms of anemia.  I cautioned him to decrease his alcohol intake.   Lucile Shutters, MD  09/18/2011  11:00 AM

## 2011-09-28 DIAGNOSIS — Z87442 Personal history of urinary calculi: Secondary | ICD-10-CM

## 2011-09-28 DIAGNOSIS — E785 Hyperlipidemia, unspecified: Secondary | ICD-10-CM | POA: Insufficient documentation

## 2011-09-28 DIAGNOSIS — I25119 Atherosclerotic heart disease of native coronary artery with unspecified angina pectoris: Secondary | ICD-10-CM | POA: Insufficient documentation

## 2011-09-28 DIAGNOSIS — I10 Essential (primary) hypertension: Secondary | ICD-10-CM

## 2011-09-28 DIAGNOSIS — M109 Gout, unspecified: Secondary | ICD-10-CM

## 2011-09-28 DIAGNOSIS — N189 Chronic kidney disease, unspecified: Secondary | ICD-10-CM

## 2011-09-28 DIAGNOSIS — Z8679 Personal history of other diseases of the circulatory system: Secondary | ICD-10-CM | POA: Insufficient documentation

## 2011-09-28 DIAGNOSIS — K279 Peptic ulcer, site unspecified, unspecified as acute or chronic, without hemorrhage or perforation: Secondary | ICD-10-CM

## 2011-09-28 DIAGNOSIS — F1011 Alcohol abuse, in remission: Secondary | ICD-10-CM

## 2011-09-28 DIAGNOSIS — I2510 Atherosclerotic heart disease of native coronary artery without angina pectoris: Secondary | ICD-10-CM | POA: Insufficient documentation

## 2011-09-28 DIAGNOSIS — E042 Nontoxic multinodular goiter: Secondary | ICD-10-CM

## 2011-09-28 DIAGNOSIS — Z87891 Personal history of nicotine dependence: Secondary | ICD-10-CM

## 2011-09-28 HISTORY — DX: Alcohol abuse, in remission: F10.11

## 2011-09-28 HISTORY — DX: Personal history of urinary calculi: Z87.442

## 2011-09-28 HISTORY — DX: Chronic kidney disease, unspecified: N18.9

## 2011-09-28 HISTORY — DX: Atherosclerotic heart disease of native coronary artery without angina pectoris: I25.10

## 2011-09-28 HISTORY — DX: Personal history of nicotine dependence: Z87.891

## 2011-09-28 HISTORY — DX: Atherosclerotic heart disease of native coronary artery with unspecified angina pectoris: I25.119

## 2011-09-28 HISTORY — DX: Personal history of other diseases of the circulatory system: Z86.79

## 2011-09-28 HISTORY — DX: Gout, unspecified: M10.9

## 2011-09-28 HISTORY — DX: Peptic ulcer, site unspecified, unspecified as acute or chronic, without hemorrhage or perforation: K27.9

## 2011-09-28 HISTORY — DX: Essential (primary) hypertension: I10

## 2011-09-28 HISTORY — DX: Nontoxic multinodular goiter: E04.2

## 2011-09-29 DIAGNOSIS — E785 Hyperlipidemia, unspecified: Secondary | ICD-10-CM | POA: Diagnosis not present

## 2011-09-29 DIAGNOSIS — I10 Essential (primary) hypertension: Secondary | ICD-10-CM | POA: Diagnosis not present

## 2011-09-29 DIAGNOSIS — R0989 Other specified symptoms and signs involving the circulatory and respiratory systems: Secondary | ICD-10-CM | POA: Insufficient documentation

## 2011-09-29 DIAGNOSIS — I2510 Atherosclerotic heart disease of native coronary artery without angina pectoris: Secondary | ICD-10-CM | POA: Diagnosis not present

## 2011-09-29 HISTORY — DX: Other specified symptoms and signs involving the circulatory and respiratory systems: R09.89

## 2011-09-30 DIAGNOSIS — R0989 Other specified symptoms and signs involving the circulatory and respiratory systems: Secondary | ICD-10-CM | POA: Diagnosis not present

## 2011-11-12 ENCOUNTER — Telehealth: Payer: Self-pay | Admitting: Oncology

## 2011-11-12 DIAGNOSIS — E039 Hypothyroidism, unspecified: Secondary | ICD-10-CM | POA: Diagnosis not present

## 2011-11-12 DIAGNOSIS — R5381 Other malaise: Secondary | ICD-10-CM | POA: Diagnosis not present

## 2011-11-12 NOTE — Telephone Encounter (Signed)
Pt came by today to ck on appt.  Pulled up last note and advise pt that he did not need a f/u unless his lab had an issue or he felt anemic.  Pt dropped off a copy of his labs and i will give them to the nurse    aom

## 2011-11-25 DIAGNOSIS — Z79899 Other long term (current) drug therapy: Secondary | ICD-10-CM | POA: Diagnosis not present

## 2011-11-25 DIAGNOSIS — Z7902 Long term (current) use of antithrombotics/antiplatelets: Secondary | ICD-10-CM | POA: Diagnosis not present

## 2011-11-25 DIAGNOSIS — Z7982 Long term (current) use of aspirin: Secondary | ICD-10-CM | POA: Diagnosis not present

## 2011-11-25 DIAGNOSIS — I2510 Atherosclerotic heart disease of native coronary artery without angina pectoris: Secondary | ICD-10-CM | POA: Diagnosis not present

## 2011-11-25 DIAGNOSIS — R748 Abnormal levels of other serum enzymes: Secondary | ICD-10-CM | POA: Diagnosis not present

## 2011-11-25 DIAGNOSIS — R072 Precordial pain: Secondary | ICD-10-CM | POA: Diagnosis not present

## 2011-11-25 DIAGNOSIS — I10 Essential (primary) hypertension: Secondary | ICD-10-CM | POA: Diagnosis not present

## 2011-11-25 DIAGNOSIS — Z9861 Coronary angioplasty status: Secondary | ICD-10-CM | POA: Diagnosis not present

## 2011-11-25 DIAGNOSIS — K219 Gastro-esophageal reflux disease without esophagitis: Secondary | ICD-10-CM | POA: Diagnosis not present

## 2011-11-25 DIAGNOSIS — R0789 Other chest pain: Secondary | ICD-10-CM | POA: Diagnosis not present

## 2011-11-25 DIAGNOSIS — R079 Chest pain, unspecified: Secondary | ICD-10-CM | POA: Diagnosis not present

## 2011-11-25 DIAGNOSIS — E785 Hyperlipidemia, unspecified: Secondary | ICD-10-CM | POA: Diagnosis not present

## 2011-11-26 DIAGNOSIS — R079 Chest pain, unspecified: Secondary | ICD-10-CM | POA: Diagnosis not present

## 2011-11-26 DIAGNOSIS — Z7982 Long term (current) use of aspirin: Secondary | ICD-10-CM | POA: Diagnosis not present

## 2011-11-26 DIAGNOSIS — R072 Precordial pain: Secondary | ICD-10-CM | POA: Diagnosis not present

## 2011-11-26 DIAGNOSIS — I2510 Atherosclerotic heart disease of native coronary artery without angina pectoris: Secondary | ICD-10-CM | POA: Diagnosis not present

## 2011-11-26 DIAGNOSIS — R0789 Other chest pain: Secondary | ICD-10-CM | POA: Diagnosis not present

## 2011-11-26 DIAGNOSIS — R9431 Abnormal electrocardiogram [ECG] [EKG]: Secondary | ICD-10-CM | POA: Diagnosis not present

## 2011-11-26 DIAGNOSIS — Z7902 Long term (current) use of antithrombotics/antiplatelets: Secondary | ICD-10-CM | POA: Diagnosis not present

## 2011-11-26 DIAGNOSIS — K219 Gastro-esophageal reflux disease without esophagitis: Secondary | ICD-10-CM | POA: Diagnosis not present

## 2011-11-26 DIAGNOSIS — I10 Essential (primary) hypertension: Secondary | ICD-10-CM | POA: Diagnosis not present

## 2011-11-26 DIAGNOSIS — Z9861 Coronary angioplasty status: Secondary | ICD-10-CM | POA: Diagnosis not present

## 2011-11-26 DIAGNOSIS — E785 Hyperlipidemia, unspecified: Secondary | ICD-10-CM | POA: Diagnosis not present

## 2011-12-07 DIAGNOSIS — N200 Calculus of kidney: Secondary | ICD-10-CM | POA: Diagnosis not present

## 2011-12-07 DIAGNOSIS — N508 Other specified disorders of male genital organs: Secondary | ICD-10-CM | POA: Diagnosis not present

## 2011-12-07 DIAGNOSIS — D649 Anemia, unspecified: Secondary | ICD-10-CM | POA: Diagnosis not present

## 2011-12-07 DIAGNOSIS — R634 Abnormal weight loss: Secondary | ICD-10-CM | POA: Diagnosis not present

## 2011-12-16 DIAGNOSIS — I2510 Atherosclerotic heart disease of native coronary artery without angina pectoris: Secondary | ICD-10-CM | POA: Diagnosis not present

## 2011-12-16 DIAGNOSIS — R972 Elevated prostate specific antigen [PSA]: Secondary | ICD-10-CM | POA: Diagnosis not present

## 2011-12-16 DIAGNOSIS — R5381 Other malaise: Secondary | ICD-10-CM | POA: Diagnosis not present

## 2011-12-22 DIAGNOSIS — R5381 Other malaise: Secondary | ICD-10-CM | POA: Diagnosis not present

## 2011-12-22 DIAGNOSIS — E039 Hypothyroidism, unspecified: Secondary | ICD-10-CM | POA: Diagnosis not present

## 2011-12-23 DIAGNOSIS — N400 Enlarged prostate without lower urinary tract symptoms: Secondary | ICD-10-CM | POA: Diagnosis not present

## 2011-12-23 DIAGNOSIS — N200 Calculus of kidney: Secondary | ICD-10-CM | POA: Diagnosis not present

## 2011-12-23 DIAGNOSIS — N1330 Unspecified hydronephrosis: Secondary | ICD-10-CM | POA: Diagnosis not present

## 2011-12-23 DIAGNOSIS — N201 Calculus of ureter: Secondary | ICD-10-CM | POA: Diagnosis not present

## 2011-12-23 DIAGNOSIS — Z79899 Other long term (current) drug therapy: Secondary | ICD-10-CM | POA: Diagnosis not present

## 2011-12-23 DIAGNOSIS — N281 Cyst of kidney, acquired: Secondary | ICD-10-CM | POA: Diagnosis not present

## 2011-12-23 DIAGNOSIS — N529 Male erectile dysfunction, unspecified: Secondary | ICD-10-CM | POA: Diagnosis not present

## 2011-12-28 DIAGNOSIS — N201 Calculus of ureter: Secondary | ICD-10-CM | POA: Diagnosis not present

## 2011-12-28 DIAGNOSIS — N135 Crossing vessel and stricture of ureter without hydronephrosis: Secondary | ICD-10-CM | POA: Diagnosis not present

## 2011-12-28 DIAGNOSIS — N200 Calculus of kidney: Secondary | ICD-10-CM | POA: Diagnosis not present

## 2012-01-02 DIAGNOSIS — Z9861 Coronary angioplasty status: Secondary | ICD-10-CM | POA: Diagnosis not present

## 2012-01-02 DIAGNOSIS — Z9889 Other specified postprocedural states: Secondary | ICD-10-CM | POA: Diagnosis not present

## 2012-01-02 DIAGNOSIS — D649 Anemia, unspecified: Secondary | ICD-10-CM | POA: Diagnosis not present

## 2012-01-02 DIAGNOSIS — Z79899 Other long term (current) drug therapy: Secondary | ICD-10-CM | POA: Diagnosis not present

## 2012-01-02 DIAGNOSIS — N400 Enlarged prostate without lower urinary tract symptoms: Secondary | ICD-10-CM | POA: Diagnosis not present

## 2012-01-02 DIAGNOSIS — N529 Male erectile dysfunction, unspecified: Secondary | ICD-10-CM | POA: Diagnosis not present

## 2012-01-02 DIAGNOSIS — I10 Essential (primary) hypertension: Secondary | ICD-10-CM | POA: Diagnosis not present

## 2012-01-02 DIAGNOSIS — N200 Calculus of kidney: Secondary | ICD-10-CM | POA: Diagnosis not present

## 2012-01-02 DIAGNOSIS — I2510 Atherosclerotic heart disease of native coronary artery without angina pectoris: Secondary | ICD-10-CM | POA: Diagnosis not present

## 2012-01-05 DIAGNOSIS — Z9861 Coronary angioplasty status: Secondary | ICD-10-CM | POA: Diagnosis not present

## 2012-01-05 DIAGNOSIS — I2510 Atherosclerotic heart disease of native coronary artery without angina pectoris: Secondary | ICD-10-CM | POA: Diagnosis not present

## 2012-01-05 DIAGNOSIS — N200 Calculus of kidney: Secondary | ICD-10-CM | POA: Diagnosis not present

## 2012-01-05 DIAGNOSIS — N529 Male erectile dysfunction, unspecified: Secondary | ICD-10-CM | POA: Diagnosis not present

## 2012-01-05 DIAGNOSIS — N400 Enlarged prostate without lower urinary tract symptoms: Secondary | ICD-10-CM | POA: Diagnosis not present

## 2012-01-05 DIAGNOSIS — I10 Essential (primary) hypertension: Secondary | ICD-10-CM | POA: Diagnosis not present

## 2012-01-22 DIAGNOSIS — N289 Disorder of kidney and ureter, unspecified: Secondary | ICD-10-CM | POA: Diagnosis not present

## 2012-01-22 DIAGNOSIS — N200 Calculus of kidney: Secondary | ICD-10-CM | POA: Diagnosis not present

## 2012-01-22 DIAGNOSIS — N529 Male erectile dysfunction, unspecified: Secondary | ICD-10-CM | POA: Diagnosis not present

## 2012-01-25 DIAGNOSIS — R0989 Other specified symptoms and signs involving the circulatory and respiratory systems: Secondary | ICD-10-CM | POA: Diagnosis not present

## 2012-01-25 DIAGNOSIS — N529 Male erectile dysfunction, unspecified: Secondary | ICD-10-CM | POA: Diagnosis not present

## 2012-01-25 DIAGNOSIS — R0609 Other forms of dyspnea: Secondary | ICD-10-CM | POA: Diagnosis not present

## 2012-01-29 DIAGNOSIS — N529 Male erectile dysfunction, unspecified: Secondary | ICD-10-CM | POA: Diagnosis not present

## 2012-02-01 DIAGNOSIS — N400 Enlarged prostate without lower urinary tract symptoms: Secondary | ICD-10-CM | POA: Diagnosis not present

## 2012-02-08 DIAGNOSIS — N400 Enlarged prostate without lower urinary tract symptoms: Secondary | ICD-10-CM | POA: Diagnosis not present

## 2012-02-12 DIAGNOSIS — N509 Disorder of male genital organs, unspecified: Secondary | ICD-10-CM | POA: Diagnosis not present

## 2012-02-22 DIAGNOSIS — M7040 Prepatellar bursitis, unspecified knee: Secondary | ICD-10-CM | POA: Diagnosis not present

## 2012-02-22 DIAGNOSIS — N401 Enlarged prostate with lower urinary tract symptoms: Secondary | ICD-10-CM | POA: Diagnosis not present

## 2012-03-17 DIAGNOSIS — N400 Enlarged prostate without lower urinary tract symptoms: Secondary | ICD-10-CM | POA: Diagnosis not present

## 2012-03-30 DIAGNOSIS — N509 Disorder of male genital organs, unspecified: Secondary | ICD-10-CM | POA: Diagnosis not present

## 2012-04-11 DIAGNOSIS — IMO0002 Reserved for concepts with insufficient information to code with codable children: Secondary | ICD-10-CM | POA: Diagnosis not present

## 2012-04-11 DIAGNOSIS — M47817 Spondylosis without myelopathy or radiculopathy, lumbosacral region: Secondary | ICD-10-CM | POA: Diagnosis not present

## 2012-04-26 DIAGNOSIS — L821 Other seborrheic keratosis: Secondary | ICD-10-CM | POA: Diagnosis not present

## 2012-04-26 DIAGNOSIS — D485 Neoplasm of uncertain behavior of skin: Secondary | ICD-10-CM | POA: Diagnosis not present

## 2012-05-04 DIAGNOSIS — N529 Male erectile dysfunction, unspecified: Secondary | ICD-10-CM | POA: Diagnosis not present

## 2012-05-06 DIAGNOSIS — M76899 Other specified enthesopathies of unspecified lower limb, excluding foot: Secondary | ICD-10-CM | POA: Diagnosis not present

## 2012-05-09 DIAGNOSIS — R0989 Other specified symptoms and signs involving the circulatory and respiratory systems: Secondary | ICD-10-CM | POA: Diagnosis not present

## 2012-05-09 DIAGNOSIS — E785 Hyperlipidemia, unspecified: Secondary | ICD-10-CM | POA: Diagnosis not present

## 2012-05-09 DIAGNOSIS — I2510 Atherosclerotic heart disease of native coronary artery without angina pectoris: Secondary | ICD-10-CM | POA: Diagnosis not present

## 2012-05-09 DIAGNOSIS — N189 Chronic kidney disease, unspecified: Secondary | ICD-10-CM | POA: Diagnosis not present

## 2012-05-10 DIAGNOSIS — E7800 Pure hypercholesterolemia, unspecified: Secondary | ICD-10-CM | POA: Diagnosis not present

## 2012-05-10 DIAGNOSIS — I10 Essential (primary) hypertension: Secondary | ICD-10-CM | POA: Diagnosis not present

## 2012-05-13 DIAGNOSIS — Q618 Other cystic kidney diseases: Secondary | ICD-10-CM | POA: Diagnosis not present

## 2012-05-13 DIAGNOSIS — N1330 Unspecified hydronephrosis: Secondary | ICD-10-CM | POA: Diagnosis not present

## 2012-05-13 DIAGNOSIS — I2510 Atherosclerotic heart disease of native coronary artery without angina pectoris: Secondary | ICD-10-CM | POA: Diagnosis not present

## 2012-05-30 DIAGNOSIS — M47817 Spondylosis without myelopathy or radiculopathy, lumbosacral region: Secondary | ICD-10-CM | POA: Diagnosis not present

## 2012-05-30 DIAGNOSIS — M48061 Spinal stenosis, lumbar region without neurogenic claudication: Secondary | ICD-10-CM | POA: Diagnosis not present

## 2012-05-30 DIAGNOSIS — IMO0002 Reserved for concepts with insufficient information to code with codable children: Secondary | ICD-10-CM | POA: Diagnosis not present

## 2012-06-15 DIAGNOSIS — Z23 Encounter for immunization: Secondary | ICD-10-CM | POA: Diagnosis not present

## 2012-06-27 DIAGNOSIS — E860 Dehydration: Secondary | ICD-10-CM | POA: Diagnosis not present

## 2012-06-27 DIAGNOSIS — R7301 Impaired fasting glucose: Secondary | ICD-10-CM | POA: Diagnosis not present

## 2012-06-27 DIAGNOSIS — E161 Other hypoglycemia: Secondary | ICD-10-CM | POA: Diagnosis not present

## 2012-06-27 DIAGNOSIS — I999 Unspecified disorder of circulatory system: Secondary | ICD-10-CM | POA: Diagnosis not present

## 2012-06-27 DIAGNOSIS — R42 Dizziness and giddiness: Secondary | ICD-10-CM | POA: Diagnosis not present

## 2012-06-27 DIAGNOSIS — E872 Acidosis: Secondary | ICD-10-CM | POA: Diagnosis not present

## 2012-07-07 DIAGNOSIS — J441 Chronic obstructive pulmonary disease with (acute) exacerbation: Secondary | ICD-10-CM | POA: Diagnosis not present

## 2012-07-07 DIAGNOSIS — E119 Type 2 diabetes mellitus without complications: Secondary | ICD-10-CM | POA: Diagnosis not present

## 2012-07-07 DIAGNOSIS — D649 Anemia, unspecified: Secondary | ICD-10-CM | POA: Diagnosis not present

## 2012-07-07 DIAGNOSIS — I10 Essential (primary) hypertension: Secondary | ICD-10-CM | POA: Diagnosis not present

## 2012-07-12 DIAGNOSIS — E119 Type 2 diabetes mellitus without complications: Secondary | ICD-10-CM | POA: Diagnosis not present

## 2012-07-12 DIAGNOSIS — I10 Essential (primary) hypertension: Secondary | ICD-10-CM | POA: Diagnosis not present

## 2012-07-12 DIAGNOSIS — J441 Chronic obstructive pulmonary disease with (acute) exacerbation: Secondary | ICD-10-CM | POA: Diagnosis not present

## 2012-07-12 DIAGNOSIS — D649 Anemia, unspecified: Secondary | ICD-10-CM | POA: Diagnosis not present

## 2012-07-13 DIAGNOSIS — E119 Type 2 diabetes mellitus without complications: Secondary | ICD-10-CM | POA: Diagnosis not present

## 2012-07-13 DIAGNOSIS — I10 Essential (primary) hypertension: Secondary | ICD-10-CM | POA: Diagnosis not present

## 2012-07-13 DIAGNOSIS — J441 Chronic obstructive pulmonary disease with (acute) exacerbation: Secondary | ICD-10-CM | POA: Diagnosis not present

## 2012-07-13 DIAGNOSIS — D649 Anemia, unspecified: Secondary | ICD-10-CM | POA: Diagnosis not present

## 2012-07-15 DIAGNOSIS — E039 Hypothyroidism, unspecified: Secondary | ICD-10-CM | POA: Diagnosis not present

## 2012-07-15 DIAGNOSIS — Z79899 Other long term (current) drug therapy: Secondary | ICD-10-CM | POA: Diagnosis not present

## 2012-07-15 DIAGNOSIS — R5381 Other malaise: Secondary | ICD-10-CM | POA: Diagnosis not present

## 2012-07-15 DIAGNOSIS — E7800 Pure hypercholesterolemia, unspecified: Secondary | ICD-10-CM | POA: Diagnosis not present

## 2012-07-21 DIAGNOSIS — I129 Hypertensive chronic kidney disease with stage 1 through stage 4 chronic kidney disease, or unspecified chronic kidney disease: Secondary | ICD-10-CM | POA: Diagnosis not present

## 2012-07-21 DIAGNOSIS — E039 Hypothyroidism, unspecified: Secondary | ICD-10-CM | POA: Diagnosis not present

## 2012-07-29 DIAGNOSIS — M25579 Pain in unspecified ankle and joints of unspecified foot: Secondary | ICD-10-CM | POA: Diagnosis not present

## 2012-08-02 DIAGNOSIS — E7800 Pure hypercholesterolemia, unspecified: Secondary | ICD-10-CM | POA: Diagnosis not present

## 2012-08-02 DIAGNOSIS — I10 Essential (primary) hypertension: Secondary | ICD-10-CM | POA: Diagnosis not present

## 2012-08-04 DIAGNOSIS — E119 Type 2 diabetes mellitus without complications: Secondary | ICD-10-CM | POA: Diagnosis not present

## 2012-08-04 DIAGNOSIS — D649 Anemia, unspecified: Secondary | ICD-10-CM | POA: Diagnosis not present

## 2012-08-04 DIAGNOSIS — J441 Chronic obstructive pulmonary disease with (acute) exacerbation: Secondary | ICD-10-CM | POA: Diagnosis not present

## 2012-08-04 DIAGNOSIS — I10 Essential (primary) hypertension: Secondary | ICD-10-CM | POA: Diagnosis not present

## 2012-08-09 DIAGNOSIS — I10 Essential (primary) hypertension: Secondary | ICD-10-CM | POA: Diagnosis not present

## 2012-08-09 DIAGNOSIS — I259 Chronic ischemic heart disease, unspecified: Secondary | ICD-10-CM | POA: Diagnosis not present

## 2012-08-09 DIAGNOSIS — R5381 Other malaise: Secondary | ICD-10-CM | POA: Diagnosis not present

## 2012-08-09 DIAGNOSIS — D539 Nutritional anemia, unspecified: Secondary | ICD-10-CM | POA: Diagnosis not present

## 2012-08-09 DIAGNOSIS — R5383 Other fatigue: Secondary | ICD-10-CM | POA: Diagnosis not present

## 2012-08-09 DIAGNOSIS — E039 Hypothyroidism, unspecified: Secondary | ICD-10-CM | POA: Diagnosis not present

## 2012-09-14 DIAGNOSIS — E039 Hypothyroidism, unspecified: Secondary | ICD-10-CM | POA: Diagnosis not present

## 2012-09-14 DIAGNOSIS — Z79899 Other long term (current) drug therapy: Secondary | ICD-10-CM | POA: Diagnosis not present

## 2012-09-14 DIAGNOSIS — E7800 Pure hypercholesterolemia, unspecified: Secondary | ICD-10-CM | POA: Diagnosis not present

## 2012-09-15 DIAGNOSIS — E039 Hypothyroidism, unspecified: Secondary | ICD-10-CM | POA: Diagnosis not present

## 2012-09-15 DIAGNOSIS — E7800 Pure hypercholesterolemia, unspecified: Secondary | ICD-10-CM | POA: Diagnosis not present

## 2012-09-15 DIAGNOSIS — I10 Essential (primary) hypertension: Secondary | ICD-10-CM | POA: Diagnosis not present

## 2012-10-28 DIAGNOSIS — E039 Hypothyroidism, unspecified: Secondary | ICD-10-CM | POA: Diagnosis not present

## 2012-10-28 DIAGNOSIS — E7800 Pure hypercholesterolemia, unspecified: Secondary | ICD-10-CM | POA: Diagnosis not present

## 2012-10-28 DIAGNOSIS — I10 Essential (primary) hypertension: Secondary | ICD-10-CM | POA: Diagnosis not present

## 2012-10-28 DIAGNOSIS — R5381 Other malaise: Secondary | ICD-10-CM | POA: Diagnosis not present

## 2012-12-16 DIAGNOSIS — M25559 Pain in unspecified hip: Secondary | ICD-10-CM | POA: Diagnosis not present

## 2013-01-02 DIAGNOSIS — H6120 Impacted cerumen, unspecified ear: Secondary | ICD-10-CM | POA: Diagnosis not present

## 2013-01-02 DIAGNOSIS — H9209 Otalgia, unspecified ear: Secondary | ICD-10-CM | POA: Diagnosis not present

## 2013-01-02 DIAGNOSIS — J342 Deviated nasal septum: Secondary | ICD-10-CM | POA: Diagnosis not present

## 2013-01-25 DIAGNOSIS — R5381 Other malaise: Secondary | ICD-10-CM | POA: Diagnosis not present

## 2013-01-25 DIAGNOSIS — E7800 Pure hypercholesterolemia, unspecified: Secondary | ICD-10-CM | POA: Diagnosis not present

## 2013-01-25 DIAGNOSIS — Z125 Encounter for screening for malignant neoplasm of prostate: Secondary | ICD-10-CM | POA: Diagnosis not present

## 2013-01-25 DIAGNOSIS — E039 Hypothyroidism, unspecified: Secondary | ICD-10-CM | POA: Diagnosis not present

## 2013-02-02 DIAGNOSIS — N289 Disorder of kidney and ureter, unspecified: Secondary | ICD-10-CM | POA: Diagnosis not present

## 2013-02-02 DIAGNOSIS — E039 Hypothyroidism, unspecified: Secondary | ICD-10-CM | POA: Diagnosis not present

## 2013-03-07 DIAGNOSIS — R059 Cough, unspecified: Secondary | ICD-10-CM | POA: Diagnosis not present

## 2013-03-07 DIAGNOSIS — R05 Cough: Secondary | ICD-10-CM | POA: Diagnosis not present

## 2013-03-27 DIAGNOSIS — R0602 Shortness of breath: Secondary | ICD-10-CM | POA: Diagnosis not present

## 2013-03-27 DIAGNOSIS — I5030 Unspecified diastolic (congestive) heart failure: Secondary | ICD-10-CM | POA: Diagnosis not present

## 2013-03-27 DIAGNOSIS — N189 Chronic kidney disease, unspecified: Secondary | ICD-10-CM | POA: Diagnosis not present

## 2013-03-31 DIAGNOSIS — I2129 ST elevation (STEMI) myocardial infarction involving other sites: Secondary | ICD-10-CM | POA: Diagnosis not present

## 2013-03-31 DIAGNOSIS — I469 Cardiac arrest, cause unspecified: Secondary | ICD-10-CM | POA: Diagnosis not present

## 2013-03-31 DIAGNOSIS — J9819 Other pulmonary collapse: Secondary | ICD-10-CM | POA: Diagnosis not present

## 2013-03-31 DIAGNOSIS — Z9889 Other specified postprocedural states: Secondary | ICD-10-CM | POA: Diagnosis not present

## 2013-03-31 DIAGNOSIS — Z9861 Coronary angioplasty status: Secondary | ICD-10-CM | POA: Diagnosis not present

## 2013-03-31 DIAGNOSIS — Z452 Encounter for adjustment and management of vascular access device: Secondary | ICD-10-CM | POA: Diagnosis not present

## 2013-03-31 DIAGNOSIS — I2109 ST elevation (STEMI) myocardial infarction involving other coronary artery of anterior wall: Secondary | ICD-10-CM | POA: Diagnosis not present

## 2013-03-31 DIAGNOSIS — I509 Heart failure, unspecified: Secondary | ICD-10-CM | POA: Diagnosis not present

## 2013-03-31 DIAGNOSIS — I219 Acute myocardial infarction, unspecified: Secondary | ICD-10-CM | POA: Diagnosis not present

## 2013-03-31 DIAGNOSIS — I4901 Ventricular fibrillation: Secondary | ICD-10-CM | POA: Diagnosis not present

## 2013-03-31 DIAGNOSIS — I2510 Atherosclerotic heart disease of native coronary artery without angina pectoris: Secondary | ICD-10-CM | POA: Diagnosis not present

## 2013-04-01 DIAGNOSIS — F0390 Unspecified dementia without behavioral disturbance: Secondary | ICD-10-CM | POA: Diagnosis not present

## 2013-04-01 DIAGNOSIS — Z9861 Coronary angioplasty status: Secondary | ICD-10-CM | POA: Diagnosis not present

## 2013-04-01 DIAGNOSIS — I469 Cardiac arrest, cause unspecified: Secondary | ICD-10-CM | POA: Diagnosis not present

## 2013-04-01 DIAGNOSIS — I2109 ST elevation (STEMI) myocardial infarction involving other coronary artery of anterior wall: Secondary | ICD-10-CM | POA: Diagnosis not present

## 2013-04-01 DIAGNOSIS — I2789 Other specified pulmonary heart diseases: Secondary | ICD-10-CM | POA: Diagnosis not present

## 2013-04-01 DIAGNOSIS — I219 Acute myocardial infarction, unspecified: Secondary | ICD-10-CM | POA: Diagnosis not present

## 2013-04-02 DIAGNOSIS — F0390 Unspecified dementia without behavioral disturbance: Secondary | ICD-10-CM | POA: Diagnosis not present

## 2013-04-02 DIAGNOSIS — Z9861 Coronary angioplasty status: Secondary | ICD-10-CM | POA: Diagnosis not present

## 2013-04-02 DIAGNOSIS — I219 Acute myocardial infarction, unspecified: Secondary | ICD-10-CM | POA: Diagnosis not present

## 2013-04-02 DIAGNOSIS — I469 Cardiac arrest, cause unspecified: Secondary | ICD-10-CM | POA: Diagnosis not present

## 2013-04-02 DIAGNOSIS — I2789 Other specified pulmonary heart diseases: Secondary | ICD-10-CM | POA: Diagnosis not present

## 2013-04-02 DIAGNOSIS — I2109 ST elevation (STEMI) myocardial infarction involving other coronary artery of anterior wall: Secondary | ICD-10-CM | POA: Diagnosis not present

## 2013-04-03 DIAGNOSIS — R05 Cough: Secondary | ICD-10-CM | POA: Diagnosis not present

## 2013-04-03 DIAGNOSIS — I270 Primary pulmonary hypertension: Secondary | ICD-10-CM | POA: Diagnosis not present

## 2013-04-03 DIAGNOSIS — Z9861 Coronary angioplasty status: Secondary | ICD-10-CM | POA: Diagnosis not present

## 2013-04-03 DIAGNOSIS — I2109 ST elevation (STEMI) myocardial infarction involving other coronary artery of anterior wall: Secondary | ICD-10-CM | POA: Diagnosis not present

## 2013-04-03 DIAGNOSIS — I059 Rheumatic mitral valve disease, unspecified: Secondary | ICD-10-CM | POA: Diagnosis not present

## 2013-04-04 DIAGNOSIS — I2109 ST elevation (STEMI) myocardial infarction involving other coronary artery of anterior wall: Secondary | ICD-10-CM | POA: Diagnosis not present

## 2013-04-04 DIAGNOSIS — Z9861 Coronary angioplasty status: Secondary | ICD-10-CM | POA: Diagnosis not present

## 2013-04-05 DIAGNOSIS — Z9861 Coronary angioplasty status: Secondary | ICD-10-CM | POA: Diagnosis not present

## 2013-04-05 DIAGNOSIS — I2109 ST elevation (STEMI) myocardial infarction involving other coronary artery of anterior wall: Secondary | ICD-10-CM | POA: Diagnosis not present

## 2013-04-06 DIAGNOSIS — R799 Abnormal finding of blood chemistry, unspecified: Secondary | ICD-10-CM | POA: Diagnosis not present

## 2013-04-06 DIAGNOSIS — D62 Acute posthemorrhagic anemia: Secondary | ICD-10-CM | POA: Diagnosis not present

## 2013-04-06 DIAGNOSIS — R0609 Other forms of dyspnea: Secondary | ICD-10-CM | POA: Diagnosis not present

## 2013-04-06 DIAGNOSIS — N179 Acute kidney failure, unspecified: Secondary | ICD-10-CM | POA: Diagnosis not present

## 2013-04-06 DIAGNOSIS — R072 Precordial pain: Secondary | ICD-10-CM | POA: Diagnosis not present

## 2013-04-06 DIAGNOSIS — D649 Anemia, unspecified: Secondary | ICD-10-CM | POA: Diagnosis not present

## 2013-04-06 DIAGNOSIS — I5031 Acute diastolic (congestive) heart failure: Secondary | ICD-10-CM | POA: Diagnosis not present

## 2013-04-06 DIAGNOSIS — J158 Pneumonia due to other specified bacteria: Secondary | ICD-10-CM | POA: Diagnosis not present

## 2013-04-06 DIAGNOSIS — I2510 Atherosclerotic heart disease of native coronary artery without angina pectoris: Secondary | ICD-10-CM | POA: Diagnosis not present

## 2013-04-06 DIAGNOSIS — I2109 ST elevation (STEMI) myocardial infarction involving other coronary artery of anterior wall: Secondary | ICD-10-CM | POA: Diagnosis not present

## 2013-04-06 DIAGNOSIS — R0602 Shortness of breath: Secondary | ICD-10-CM | POA: Diagnosis not present

## 2013-04-06 DIAGNOSIS — I219 Acute myocardial infarction, unspecified: Secondary | ICD-10-CM | POA: Diagnosis not present

## 2013-04-06 DIAGNOSIS — R079 Chest pain, unspecified: Secondary | ICD-10-CM | POA: Diagnosis not present

## 2013-04-06 DIAGNOSIS — Z9861 Coronary angioplasty status: Secondary | ICD-10-CM | POA: Diagnosis not present

## 2013-04-06 DIAGNOSIS — J9819 Other pulmonary collapse: Secondary | ICD-10-CM | POA: Diagnosis not present

## 2013-04-07 DIAGNOSIS — I5021 Acute systolic (congestive) heart failure: Secondary | ICD-10-CM | POA: Diagnosis not present

## 2013-04-07 DIAGNOSIS — I359 Nonrheumatic aortic valve disorder, unspecified: Secondary | ICD-10-CM | POA: Diagnosis not present

## 2013-04-07 DIAGNOSIS — I2510 Atherosclerotic heart disease of native coronary artery without angina pectoris: Secondary | ICD-10-CM | POA: Diagnosis not present

## 2013-04-07 DIAGNOSIS — R0602 Shortness of breath: Secondary | ICD-10-CM | POA: Diagnosis not present

## 2013-04-07 DIAGNOSIS — I219 Acute myocardial infarction, unspecified: Secondary | ICD-10-CM | POA: Diagnosis not present

## 2013-04-07 DIAGNOSIS — D62 Acute posthemorrhagic anemia: Secondary | ICD-10-CM | POA: Diagnosis not present

## 2013-04-07 DIAGNOSIS — J158 Pneumonia due to other specified bacteria: Secondary | ICD-10-CM | POA: Diagnosis not present

## 2013-04-07 DIAGNOSIS — R0989 Other specified symptoms and signs involving the circulatory and respiratory systems: Secondary | ICD-10-CM | POA: Diagnosis not present

## 2013-04-07 DIAGNOSIS — I369 Nonrheumatic tricuspid valve disorder, unspecified: Secondary | ICD-10-CM | POA: Diagnosis not present

## 2013-04-07 DIAGNOSIS — I517 Cardiomegaly: Secondary | ICD-10-CM | POA: Diagnosis not present

## 2013-04-08 DIAGNOSIS — I447 Left bundle-branch block, unspecified: Secondary | ICD-10-CM | POA: Diagnosis not present

## 2013-04-08 DIAGNOSIS — R0602 Shortness of breath: Secondary | ICD-10-CM | POA: Diagnosis not present

## 2013-04-08 DIAGNOSIS — I219 Acute myocardial infarction, unspecified: Secondary | ICD-10-CM | POA: Diagnosis not present

## 2013-04-08 DIAGNOSIS — J441 Chronic obstructive pulmonary disease with (acute) exacerbation: Secondary | ICD-10-CM | POA: Diagnosis not present

## 2013-04-08 DIAGNOSIS — I5021 Acute systolic (congestive) heart failure: Secondary | ICD-10-CM | POA: Diagnosis not present

## 2013-04-08 DIAGNOSIS — D62 Acute posthemorrhagic anemia: Secondary | ICD-10-CM | POA: Diagnosis not present

## 2013-04-08 DIAGNOSIS — J158 Pneumonia due to other specified bacteria: Secondary | ICD-10-CM | POA: Diagnosis not present

## 2013-04-09 DIAGNOSIS — I5021 Acute systolic (congestive) heart failure: Secondary | ICD-10-CM | POA: Diagnosis not present

## 2013-04-09 DIAGNOSIS — I2510 Atherosclerotic heart disease of native coronary artery without angina pectoris: Secondary | ICD-10-CM | POA: Diagnosis not present

## 2013-04-09 DIAGNOSIS — I219 Acute myocardial infarction, unspecified: Secondary | ICD-10-CM | POA: Diagnosis not present

## 2013-04-09 DIAGNOSIS — I359 Nonrheumatic aortic valve disorder, unspecified: Secondary | ICD-10-CM | POA: Diagnosis not present

## 2013-04-09 DIAGNOSIS — I059 Rheumatic mitral valve disease, unspecified: Secondary | ICD-10-CM | POA: Diagnosis not present

## 2013-04-09 DIAGNOSIS — N179 Acute kidney failure, unspecified: Secondary | ICD-10-CM | POA: Diagnosis not present

## 2013-04-09 DIAGNOSIS — J158 Pneumonia due to other specified bacteria: Secondary | ICD-10-CM | POA: Diagnosis not present

## 2013-04-09 DIAGNOSIS — R0989 Other specified symptoms and signs involving the circulatory and respiratory systems: Secondary | ICD-10-CM | POA: Diagnosis not present

## 2013-04-09 DIAGNOSIS — I517 Cardiomegaly: Secondary | ICD-10-CM | POA: Diagnosis not present

## 2013-04-09 DIAGNOSIS — R0609 Other forms of dyspnea: Secondary | ICD-10-CM | POA: Diagnosis not present

## 2013-04-09 DIAGNOSIS — A419 Sepsis, unspecified organism: Secondary | ICD-10-CM | POA: Diagnosis not present

## 2013-04-09 DIAGNOSIS — J9819 Other pulmonary collapse: Secondary | ICD-10-CM | POA: Diagnosis not present

## 2013-04-09 DIAGNOSIS — R0602 Shortness of breath: Secondary | ICD-10-CM | POA: Diagnosis not present

## 2013-04-10 DIAGNOSIS — J189 Pneumonia, unspecified organism: Secondary | ICD-10-CM | POA: Diagnosis not present

## 2013-04-10 DIAGNOSIS — I219 Acute myocardial infarction, unspecified: Secondary | ICD-10-CM | POA: Diagnosis not present

## 2013-04-10 DIAGNOSIS — D649 Anemia, unspecified: Secondary | ICD-10-CM | POA: Diagnosis not present

## 2013-04-10 DIAGNOSIS — R0602 Shortness of breath: Secondary | ICD-10-CM | POA: Diagnosis not present

## 2013-04-10 DIAGNOSIS — R4182 Altered mental status, unspecified: Secondary | ICD-10-CM | POA: Diagnosis not present

## 2013-04-11 DIAGNOSIS — I219 Acute myocardial infarction, unspecified: Secondary | ICD-10-CM | POA: Diagnosis not present

## 2013-04-11 DIAGNOSIS — I5021 Acute systolic (congestive) heart failure: Secondary | ICD-10-CM | POA: Diagnosis not present

## 2013-04-11 DIAGNOSIS — A419 Sepsis, unspecified organism: Secondary | ICD-10-CM | POA: Diagnosis not present

## 2013-04-11 DIAGNOSIS — J158 Pneumonia due to other specified bacteria: Secondary | ICD-10-CM | POA: Diagnosis not present

## 2013-04-11 DIAGNOSIS — R0602 Shortness of breath: Secondary | ICD-10-CM | POA: Diagnosis not present

## 2013-04-12 DIAGNOSIS — I219 Acute myocardial infarction, unspecified: Secondary | ICD-10-CM | POA: Diagnosis not present

## 2013-04-12 DIAGNOSIS — A419 Sepsis, unspecified organism: Secondary | ICD-10-CM | POA: Diagnosis not present

## 2013-04-12 DIAGNOSIS — R0602 Shortness of breath: Secondary | ICD-10-CM | POA: Diagnosis not present

## 2013-04-12 DIAGNOSIS — I2510 Atherosclerotic heart disease of native coronary artery without angina pectoris: Secondary | ICD-10-CM | POA: Diagnosis not present

## 2013-04-12 DIAGNOSIS — I5021 Acute systolic (congestive) heart failure: Secondary | ICD-10-CM | POA: Diagnosis not present

## 2013-04-13 DIAGNOSIS — I10 Essential (primary) hypertension: Secondary | ICD-10-CM | POA: Diagnosis not present

## 2013-04-13 DIAGNOSIS — I509 Heart failure, unspecified: Secondary | ICD-10-CM | POA: Diagnosis not present

## 2013-04-13 DIAGNOSIS — I259 Chronic ischemic heart disease, unspecified: Secondary | ICD-10-CM | POA: Diagnosis not present

## 2013-04-13 DIAGNOSIS — E7800 Pure hypercholesterolemia, unspecified: Secondary | ICD-10-CM | POA: Diagnosis not present

## 2013-04-14 DIAGNOSIS — D649 Anemia, unspecified: Secondary | ICD-10-CM | POA: Diagnosis not present

## 2013-04-14 DIAGNOSIS — E7889 Other lipoprotein metabolism disorders: Secondary | ICD-10-CM | POA: Diagnosis not present

## 2013-04-14 DIAGNOSIS — I509 Heart failure, unspecified: Secondary | ICD-10-CM | POA: Diagnosis not present

## 2013-04-21 ENCOUNTER — Telehealth: Payer: Self-pay | Admitting: *Deleted

## 2013-04-21 NOTE — Telephone Encounter (Signed)
Left VM regarding office note received. Is this FYI or request for Dr. Truett Perna to see patient again?

## 2013-04-25 DIAGNOSIS — I5030 Unspecified diastolic (congestive) heart failure: Secondary | ICD-10-CM | POA: Diagnosis not present

## 2013-04-25 DIAGNOSIS — N189 Chronic kidney disease, unspecified: Secondary | ICD-10-CM | POA: Diagnosis not present

## 2013-04-25 DIAGNOSIS — I10 Essential (primary) hypertension: Secondary | ICD-10-CM | POA: Diagnosis not present

## 2013-04-25 DIAGNOSIS — I2510 Atherosclerotic heart disease of native coronary artery without angina pectoris: Secondary | ICD-10-CM | POA: Diagnosis not present

## 2013-04-25 DIAGNOSIS — E785 Hyperlipidemia, unspecified: Secondary | ICD-10-CM | POA: Diagnosis not present

## 2013-05-02 DIAGNOSIS — I2510 Atherosclerotic heart disease of native coronary artery without angina pectoris: Secondary | ICD-10-CM | POA: Diagnosis not present

## 2013-05-02 DIAGNOSIS — K219 Gastro-esophageal reflux disease without esophagitis: Secondary | ICD-10-CM | POA: Diagnosis not present

## 2013-05-02 DIAGNOSIS — E039 Hypothyroidism, unspecified: Secondary | ICD-10-CM | POA: Diagnosis not present

## 2013-05-02 DIAGNOSIS — E119 Type 2 diabetes mellitus without complications: Secondary | ICD-10-CM | POA: Diagnosis not present

## 2013-05-02 DIAGNOSIS — I509 Heart failure, unspecified: Secondary | ICD-10-CM | POA: Diagnosis not present

## 2013-05-02 DIAGNOSIS — Z79899 Other long term (current) drug therapy: Secondary | ICD-10-CM | POA: Diagnosis not present

## 2013-05-02 DIAGNOSIS — R079 Chest pain, unspecified: Secondary | ICD-10-CM | POA: Diagnosis not present

## 2013-05-02 DIAGNOSIS — J449 Chronic obstructive pulmonary disease, unspecified: Secondary | ICD-10-CM | POA: Diagnosis not present

## 2013-05-02 DIAGNOSIS — R0789 Other chest pain: Secondary | ICD-10-CM | POA: Diagnosis not present

## 2013-05-02 DIAGNOSIS — I129 Hypertensive chronic kidney disease with stage 1 through stage 4 chronic kidney disease, or unspecified chronic kidney disease: Secondary | ICD-10-CM | POA: Diagnosis not present

## 2013-05-02 DIAGNOSIS — N183 Chronic kidney disease, stage 3 unspecified: Secondary | ICD-10-CM | POA: Diagnosis not present

## 2013-05-08 DIAGNOSIS — D649 Anemia, unspecified: Secondary | ICD-10-CM | POA: Diagnosis not present

## 2013-05-12 DIAGNOSIS — N189 Chronic kidney disease, unspecified: Secondary | ICD-10-CM | POA: Diagnosis not present

## 2013-05-12 DIAGNOSIS — E785 Hyperlipidemia, unspecified: Secondary | ICD-10-CM | POA: Diagnosis not present

## 2013-05-12 DIAGNOSIS — I10 Essential (primary) hypertension: Secondary | ICD-10-CM | POA: Diagnosis not present

## 2013-05-12 DIAGNOSIS — R0602 Shortness of breath: Secondary | ICD-10-CM | POA: Diagnosis not present

## 2013-05-12 DIAGNOSIS — I2510 Atherosclerotic heart disease of native coronary artery without angina pectoris: Secondary | ICD-10-CM | POA: Diagnosis not present

## 2013-05-12 DIAGNOSIS — I5030 Unspecified diastolic (congestive) heart failure: Secondary | ICD-10-CM | POA: Diagnosis not present

## 2013-05-16 DIAGNOSIS — I2510 Atherosclerotic heart disease of native coronary artery without angina pectoris: Secondary | ICD-10-CM | POA: Diagnosis not present

## 2013-05-26 DIAGNOSIS — Z961 Presence of intraocular lens: Secondary | ICD-10-CM | POA: Diagnosis not present

## 2013-05-30 DIAGNOSIS — R5381 Other malaise: Secondary | ICD-10-CM | POA: Diagnosis not present

## 2013-05-30 DIAGNOSIS — R0609 Other forms of dyspnea: Secondary | ICD-10-CM | POA: Diagnosis not present

## 2013-06-02 DIAGNOSIS — R002 Palpitations: Secondary | ICD-10-CM | POA: Diagnosis not present

## 2013-06-06 DIAGNOSIS — E876 Hypokalemia: Secondary | ICD-10-CM | POA: Diagnosis not present

## 2013-06-13 DIAGNOSIS — L578 Other skin changes due to chronic exposure to nonionizing radiation: Secondary | ICD-10-CM | POA: Diagnosis not present

## 2013-06-13 DIAGNOSIS — C44319 Basal cell carcinoma of skin of other parts of face: Secondary | ICD-10-CM | POA: Diagnosis not present

## 2013-06-22 DIAGNOSIS — C44319 Basal cell carcinoma of skin of other parts of face: Secondary | ICD-10-CM | POA: Diagnosis not present

## 2013-06-23 DIAGNOSIS — I2510 Atherosclerotic heart disease of native coronary artery without angina pectoris: Secondary | ICD-10-CM | POA: Diagnosis not present

## 2013-06-23 DIAGNOSIS — E785 Hyperlipidemia, unspecified: Secondary | ICD-10-CM | POA: Diagnosis not present

## 2013-06-23 DIAGNOSIS — R0602 Shortness of breath: Secondary | ICD-10-CM | POA: Diagnosis not present

## 2013-06-23 DIAGNOSIS — I2109 ST elevation (STEMI) myocardial infarction involving other coronary artery of anterior wall: Secondary | ICD-10-CM | POA: Diagnosis not present

## 2013-06-23 DIAGNOSIS — N189 Chronic kidney disease, unspecified: Secondary | ICD-10-CM | POA: Diagnosis not present

## 2013-06-23 DIAGNOSIS — D649 Anemia, unspecified: Secondary | ICD-10-CM | POA: Diagnosis not present

## 2013-06-23 DIAGNOSIS — I10 Essential (primary) hypertension: Secondary | ICD-10-CM | POA: Diagnosis not present

## 2013-06-23 DIAGNOSIS — I5030 Unspecified diastolic (congestive) heart failure: Secondary | ICD-10-CM | POA: Diagnosis not present

## 2013-07-14 DIAGNOSIS — N390 Urinary tract infection, site not specified: Secondary | ICD-10-CM | POA: Diagnosis not present

## 2013-07-14 DIAGNOSIS — N318 Other neuromuscular dysfunction of bladder: Secondary | ICD-10-CM | POA: Diagnosis not present

## 2013-07-21 DIAGNOSIS — I10 Essential (primary) hypertension: Secondary | ICD-10-CM | POA: Diagnosis not present

## 2013-07-21 DIAGNOSIS — I2510 Atherosclerotic heart disease of native coronary artery without angina pectoris: Secondary | ICD-10-CM | POA: Diagnosis not present

## 2013-07-21 DIAGNOSIS — E7800 Pure hypercholesterolemia, unspecified: Secondary | ICD-10-CM | POA: Diagnosis not present

## 2013-07-21 DIAGNOSIS — R0602 Shortness of breath: Secondary | ICD-10-CM | POA: Diagnosis not present

## 2013-07-21 DIAGNOSIS — N189 Chronic kidney disease, unspecified: Secondary | ICD-10-CM | POA: Diagnosis not present

## 2013-07-21 DIAGNOSIS — I5030 Unspecified diastolic (congestive) heart failure: Secondary | ICD-10-CM | POA: Diagnosis not present

## 2013-07-21 DIAGNOSIS — D649 Anemia, unspecified: Secondary | ICD-10-CM | POA: Diagnosis not present

## 2013-07-28 DIAGNOSIS — Z862 Personal history of diseases of the blood and blood-forming organs and certain disorders involving the immune mechanism: Secondary | ICD-10-CM | POA: Diagnosis not present

## 2013-07-28 DIAGNOSIS — N183 Chronic kidney disease, stage 3 unspecified: Secondary | ICD-10-CM | POA: Diagnosis not present

## 2013-07-28 DIAGNOSIS — I2510 Atherosclerotic heart disease of native coronary artery without angina pectoris: Secondary | ICD-10-CM | POA: Diagnosis not present

## 2013-07-28 DIAGNOSIS — M109 Gout, unspecified: Secondary | ICD-10-CM | POA: Diagnosis not present

## 2013-08-10 DIAGNOSIS — R635 Abnormal weight gain: Secondary | ICD-10-CM | POA: Diagnosis not present

## 2013-08-10 DIAGNOSIS — D649 Anemia, unspecified: Secondary | ICD-10-CM | POA: Diagnosis not present

## 2013-08-10 DIAGNOSIS — I5030 Unspecified diastolic (congestive) heart failure: Secondary | ICD-10-CM | POA: Diagnosis not present

## 2013-08-10 DIAGNOSIS — R143 Flatulence: Secondary | ICD-10-CM | POA: Diagnosis not present

## 2013-08-10 DIAGNOSIS — R0602 Shortness of breath: Secondary | ICD-10-CM | POA: Diagnosis not present

## 2013-08-10 DIAGNOSIS — E039 Hypothyroidism, unspecified: Secondary | ICD-10-CM | POA: Diagnosis not present

## 2013-08-10 DIAGNOSIS — R141 Gas pain: Secondary | ICD-10-CM | POA: Diagnosis not present

## 2013-08-10 DIAGNOSIS — I10 Essential (primary) hypertension: Secondary | ICD-10-CM | POA: Diagnosis not present

## 2013-08-11 DIAGNOSIS — R142 Eructation: Secondary | ICD-10-CM | POA: Diagnosis not present

## 2013-08-11 DIAGNOSIS — R188 Other ascites: Secondary | ICD-10-CM | POA: Diagnosis not present

## 2013-08-11 DIAGNOSIS — R141 Gas pain: Secondary | ICD-10-CM | POA: Diagnosis not present

## 2013-08-14 DIAGNOSIS — E039 Hypothyroidism, unspecified: Secondary | ICD-10-CM | POA: Diagnosis not present

## 2013-08-14 DIAGNOSIS — I5030 Unspecified diastolic (congestive) heart failure: Secondary | ICD-10-CM | POA: Diagnosis not present

## 2013-08-15 DIAGNOSIS — D649 Anemia, unspecified: Secondary | ICD-10-CM | POA: Diagnosis not present

## 2013-09-25 DIAGNOSIS — N189 Chronic kidney disease, unspecified: Secondary | ICD-10-CM | POA: Diagnosis not present

## 2013-09-25 DIAGNOSIS — Z09 Encounter for follow-up examination after completed treatment for conditions other than malignant neoplasm: Secondary | ICD-10-CM | POA: Diagnosis not present

## 2013-09-25 DIAGNOSIS — D631 Anemia in chronic kidney disease: Secondary | ICD-10-CM | POA: Diagnosis not present

## 2013-09-25 DIAGNOSIS — N039 Chronic nephritic syndrome with unspecified morphologic changes: Secondary | ICD-10-CM | POA: Diagnosis not present

## 2013-09-27 DIAGNOSIS — R0602 Shortness of breath: Secondary | ICD-10-CM | POA: Diagnosis not present

## 2013-09-27 DIAGNOSIS — I509 Heart failure, unspecified: Secondary | ICD-10-CM | POA: Diagnosis not present

## 2013-09-27 DIAGNOSIS — Z87898 Personal history of other specified conditions: Secondary | ICD-10-CM | POA: Diagnosis not present

## 2013-09-27 DIAGNOSIS — I2109 ST elevation (STEMI) myocardial infarction involving other coronary artery of anterior wall: Secondary | ICD-10-CM | POA: Diagnosis not present

## 2013-09-27 DIAGNOSIS — I110 Hypertensive heart disease with heart failure: Secondary | ICD-10-CM | POA: Diagnosis not present

## 2013-09-27 DIAGNOSIS — E785 Hyperlipidemia, unspecified: Secondary | ICD-10-CM | POA: Diagnosis not present

## 2013-10-09 DIAGNOSIS — I2510 Atherosclerotic heart disease of native coronary artery without angina pectoris: Secondary | ICD-10-CM | POA: Diagnosis not present

## 2013-11-06 DIAGNOSIS — N039 Chronic nephritic syndrome with unspecified morphologic changes: Secondary | ICD-10-CM | POA: Diagnosis not present

## 2013-11-06 DIAGNOSIS — N189 Chronic kidney disease, unspecified: Secondary | ICD-10-CM | POA: Diagnosis not present

## 2013-11-06 DIAGNOSIS — D631 Anemia in chronic kidney disease: Secondary | ICD-10-CM | POA: Diagnosis not present

## 2013-11-15 DIAGNOSIS — Z79899 Other long term (current) drug therapy: Secondary | ICD-10-CM | POA: Diagnosis not present

## 2013-11-15 DIAGNOSIS — R079 Chest pain, unspecified: Secondary | ICD-10-CM | POA: Diagnosis not present

## 2013-11-15 DIAGNOSIS — R0789 Other chest pain: Secondary | ICD-10-CM | POA: Diagnosis not present

## 2013-11-15 DIAGNOSIS — I200 Unstable angina: Secondary | ICD-10-CM | POA: Diagnosis not present

## 2013-11-15 DIAGNOSIS — E039 Hypothyroidism, unspecified: Secondary | ICD-10-CM | POA: Diagnosis not present

## 2013-11-15 DIAGNOSIS — M109 Gout, unspecified: Secondary | ICD-10-CM | POA: Diagnosis not present

## 2013-11-15 DIAGNOSIS — I129 Hypertensive chronic kidney disease with stage 1 through stage 4 chronic kidney disease, or unspecified chronic kidney disease: Secondary | ICD-10-CM | POA: Diagnosis not present

## 2013-11-15 DIAGNOSIS — J9819 Other pulmonary collapse: Secondary | ICD-10-CM | POA: Diagnosis not present

## 2013-11-15 DIAGNOSIS — N189 Chronic kidney disease, unspecified: Secondary | ICD-10-CM | POA: Diagnosis not present

## 2013-11-15 DIAGNOSIS — N179 Acute kidney failure, unspecified: Secondary | ICD-10-CM | POA: Diagnosis not present

## 2013-11-15 DIAGNOSIS — N183 Chronic kidney disease, stage 3 unspecified: Secondary | ICD-10-CM | POA: Diagnosis not present

## 2013-11-15 DIAGNOSIS — E0590 Thyrotoxicosis, unspecified without thyrotoxic crisis or storm: Secondary | ICD-10-CM | POA: Diagnosis not present

## 2013-11-15 DIAGNOSIS — R072 Precordial pain: Secondary | ICD-10-CM | POA: Diagnosis not present

## 2013-11-15 DIAGNOSIS — K219 Gastro-esophageal reflux disease without esophagitis: Secondary | ICD-10-CM | POA: Diagnosis not present

## 2013-11-15 DIAGNOSIS — J441 Chronic obstructive pulmonary disease with (acute) exacerbation: Secondary | ICD-10-CM | POA: Diagnosis not present

## 2013-11-15 DIAGNOSIS — E119 Type 2 diabetes mellitus without complications: Secondary | ICD-10-CM | POA: Diagnosis not present

## 2013-11-15 DIAGNOSIS — I509 Heart failure, unspecified: Secondary | ICD-10-CM | POA: Diagnosis not present

## 2013-11-15 DIAGNOSIS — I2510 Atherosclerotic heart disease of native coronary artery without angina pectoris: Secondary | ICD-10-CM | POA: Diagnosis not present

## 2013-11-15 DIAGNOSIS — E7800 Pure hypercholesterolemia, unspecified: Secondary | ICD-10-CM | POA: Diagnosis not present

## 2013-11-15 DIAGNOSIS — I10 Essential (primary) hypertension: Secondary | ICD-10-CM | POA: Diagnosis not present

## 2013-11-16 DIAGNOSIS — E039 Hypothyroidism, unspecified: Secondary | ICD-10-CM | POA: Diagnosis not present

## 2013-11-16 DIAGNOSIS — N179 Acute kidney failure, unspecified: Secondary | ICD-10-CM | POA: Diagnosis not present

## 2013-11-16 DIAGNOSIS — I248 Other forms of acute ischemic heart disease: Secondary | ICD-10-CM | POA: Diagnosis not present

## 2013-11-16 DIAGNOSIS — I495 Sick sinus syndrome: Secondary | ICD-10-CM | POA: Diagnosis not present

## 2013-11-16 DIAGNOSIS — J441 Chronic obstructive pulmonary disease with (acute) exacerbation: Secondary | ICD-10-CM | POA: Diagnosis not present

## 2013-11-16 DIAGNOSIS — I200 Unstable angina: Secondary | ICD-10-CM | POA: Diagnosis not present

## 2013-11-17 DIAGNOSIS — R0789 Other chest pain: Secondary | ICD-10-CM | POA: Diagnosis not present

## 2013-11-17 DIAGNOSIS — I248 Other forms of acute ischemic heart disease: Secondary | ICD-10-CM | POA: Diagnosis not present

## 2013-11-17 DIAGNOSIS — N179 Acute kidney failure, unspecified: Secondary | ICD-10-CM | POA: Diagnosis not present

## 2013-11-17 DIAGNOSIS — J441 Chronic obstructive pulmonary disease with (acute) exacerbation: Secondary | ICD-10-CM | POA: Diagnosis not present

## 2013-11-17 DIAGNOSIS — M109 Gout, unspecified: Secondary | ICD-10-CM | POA: Diagnosis not present

## 2013-11-18 DIAGNOSIS — N183 Chronic kidney disease, stage 3 unspecified: Secondary | ICD-10-CM | POA: Diagnosis not present

## 2013-11-18 DIAGNOSIS — I200 Unstable angina: Secondary | ICD-10-CM | POA: Diagnosis not present

## 2013-11-18 DIAGNOSIS — R0789 Other chest pain: Secondary | ICD-10-CM | POA: Diagnosis not present

## 2013-11-18 DIAGNOSIS — J441 Chronic obstructive pulmonary disease with (acute) exacerbation: Secondary | ICD-10-CM | POA: Diagnosis not present

## 2013-11-22 DIAGNOSIS — K5289 Other specified noninfective gastroenteritis and colitis: Secondary | ICD-10-CM | POA: Diagnosis not present

## 2013-11-22 DIAGNOSIS — R079 Chest pain, unspecified: Secondary | ICD-10-CM | POA: Diagnosis not present

## 2013-11-30 DIAGNOSIS — I10 Essential (primary) hypertension: Secondary | ICD-10-CM | POA: Diagnosis not present

## 2013-11-30 DIAGNOSIS — I2510 Atherosclerotic heart disease of native coronary artery without angina pectoris: Secondary | ICD-10-CM | POA: Diagnosis not present

## 2013-11-30 DIAGNOSIS — I447 Left bundle-branch block, unspecified: Secondary | ICD-10-CM | POA: Diagnosis not present

## 2013-12-21 DIAGNOSIS — D631 Anemia in chronic kidney disease: Secondary | ICD-10-CM | POA: Diagnosis not present

## 2013-12-21 DIAGNOSIS — Z09 Encounter for follow-up examination after completed treatment for conditions other than malignant neoplasm: Secondary | ICD-10-CM | POA: Diagnosis not present

## 2013-12-21 DIAGNOSIS — N189 Chronic kidney disease, unspecified: Secondary | ICD-10-CM | POA: Diagnosis not present

## 2013-12-27 DIAGNOSIS — I5030 Unspecified diastolic (congestive) heart failure: Secondary | ICD-10-CM | POA: Diagnosis not present

## 2013-12-27 DIAGNOSIS — N189 Chronic kidney disease, unspecified: Secondary | ICD-10-CM | POA: Diagnosis not present

## 2013-12-27 DIAGNOSIS — I509 Heart failure, unspecified: Secondary | ICD-10-CM | POA: Diagnosis not present

## 2013-12-27 DIAGNOSIS — R0602 Shortness of breath: Secondary | ICD-10-CM | POA: Diagnosis not present

## 2013-12-27 DIAGNOSIS — I2510 Atherosclerotic heart disease of native coronary artery without angina pectoris: Secondary | ICD-10-CM | POA: Diagnosis not present

## 2013-12-27 DIAGNOSIS — I10 Essential (primary) hypertension: Secondary | ICD-10-CM | POA: Diagnosis not present

## 2014-01-01 DIAGNOSIS — N179 Acute kidney failure, unspecified: Secondary | ICD-10-CM | POA: Diagnosis not present

## 2014-01-01 DIAGNOSIS — I5030 Unspecified diastolic (congestive) heart failure: Secondary | ICD-10-CM | POA: Diagnosis not present

## 2014-01-01 DIAGNOSIS — I2109 ST elevation (STEMI) myocardial infarction involving other coronary artery of anterior wall: Secondary | ICD-10-CM | POA: Diagnosis not present

## 2014-01-01 DIAGNOSIS — I509 Heart failure, unspecified: Secondary | ICD-10-CM | POA: Diagnosis not present

## 2014-01-01 DIAGNOSIS — I2510 Atherosclerotic heart disease of native coronary artery without angina pectoris: Secondary | ICD-10-CM | POA: Diagnosis not present

## 2014-01-01 DIAGNOSIS — I110 Hypertensive heart disease with heart failure: Secondary | ICD-10-CM | POA: Diagnosis not present

## 2014-01-01 DIAGNOSIS — I10 Essential (primary) hypertension: Secondary | ICD-10-CM | POA: Diagnosis not present

## 2014-01-01 DIAGNOSIS — N189 Chronic kidney disease, unspecified: Secondary | ICD-10-CM | POA: Diagnosis not present

## 2014-01-01 DIAGNOSIS — E785 Hyperlipidemia, unspecified: Secondary | ICD-10-CM | POA: Diagnosis not present

## 2014-01-09 DIAGNOSIS — L259 Unspecified contact dermatitis, unspecified cause: Secondary | ICD-10-CM | POA: Diagnosis not present

## 2014-01-09 DIAGNOSIS — R233 Spontaneous ecchymoses: Secondary | ICD-10-CM | POA: Diagnosis not present

## 2014-01-09 DIAGNOSIS — L981 Factitial dermatitis: Secondary | ICD-10-CM | POA: Diagnosis not present

## 2014-02-01 DIAGNOSIS — N039 Chronic nephritic syndrome with unspecified morphologic changes: Secondary | ICD-10-CM | POA: Diagnosis not present

## 2014-02-01 DIAGNOSIS — N189 Chronic kidney disease, unspecified: Secondary | ICD-10-CM | POA: Diagnosis not present

## 2014-02-01 DIAGNOSIS — D631 Anemia in chronic kidney disease: Secondary | ICD-10-CM | POA: Diagnosis not present

## 2014-03-08 DIAGNOSIS — I110 Hypertensive heart disease with heart failure: Secondary | ICD-10-CM | POA: Diagnosis not present

## 2014-03-08 DIAGNOSIS — R002 Palpitations: Secondary | ICD-10-CM | POA: Diagnosis not present

## 2014-03-08 DIAGNOSIS — I509 Heart failure, unspecified: Secondary | ICD-10-CM | POA: Diagnosis not present

## 2014-03-16 DIAGNOSIS — N189 Chronic kidney disease, unspecified: Secondary | ICD-10-CM | POA: Diagnosis not present

## 2014-03-16 DIAGNOSIS — D631 Anemia in chronic kidney disease: Secondary | ICD-10-CM | POA: Diagnosis not present

## 2014-03-16 DIAGNOSIS — N039 Chronic nephritic syndrome with unspecified morphologic changes: Secondary | ICD-10-CM | POA: Diagnosis not present

## 2014-04-18 DIAGNOSIS — I509 Heart failure, unspecified: Secondary | ICD-10-CM | POA: Diagnosis not present

## 2014-04-18 DIAGNOSIS — I110 Hypertensive heart disease with heart failure: Secondary | ICD-10-CM | POA: Diagnosis not present

## 2014-04-18 DIAGNOSIS — I2510 Atherosclerotic heart disease of native coronary artery without angina pectoris: Secondary | ICD-10-CM | POA: Diagnosis not present

## 2014-04-18 DIAGNOSIS — I5030 Unspecified diastolic (congestive) heart failure: Secondary | ICD-10-CM | POA: Diagnosis not present

## 2014-04-19 DIAGNOSIS — Z7902 Long term (current) use of antithrombotics/antiplatelets: Secondary | ICD-10-CM | POA: Diagnosis not present

## 2014-04-19 DIAGNOSIS — R079 Chest pain, unspecified: Secondary | ICD-10-CM | POA: Diagnosis not present

## 2014-04-19 DIAGNOSIS — Z79899 Other long term (current) drug therapy: Secondary | ICD-10-CM | POA: Diagnosis not present

## 2014-04-19 DIAGNOSIS — I110 Hypertensive heart disease with heart failure: Secondary | ICD-10-CM | POA: Diagnosis not present

## 2014-04-19 DIAGNOSIS — E785 Hyperlipidemia, unspecified: Secondary | ICD-10-CM | POA: Diagnosis not present

## 2014-04-19 DIAGNOSIS — I208 Other forms of angina pectoris: Secondary | ICD-10-CM | POA: Diagnosis not present

## 2014-04-19 DIAGNOSIS — I252 Old myocardial infarction: Secondary | ICD-10-CM | POA: Diagnosis not present

## 2014-04-19 DIAGNOSIS — Z7982 Long term (current) use of aspirin: Secondary | ICD-10-CM | POA: Diagnosis not present

## 2014-04-19 DIAGNOSIS — E039 Hypothyroidism, unspecified: Secondary | ICD-10-CM | POA: Diagnosis not present

## 2014-04-19 DIAGNOSIS — Z87891 Personal history of nicotine dependence: Secondary | ICD-10-CM | POA: Diagnosis not present

## 2014-04-19 DIAGNOSIS — I340 Nonrheumatic mitral (valve) insufficiency: Secondary | ICD-10-CM | POA: Diagnosis not present

## 2014-04-19 DIAGNOSIS — R0602 Shortness of breath: Secondary | ICD-10-CM | POA: Diagnosis not present

## 2014-04-19 DIAGNOSIS — I5030 Unspecified diastolic (congestive) heart failure: Secondary | ICD-10-CM | POA: Diagnosis not present

## 2014-04-19 DIAGNOSIS — E780 Pure hypercholesterolemia: Secondary | ICD-10-CM | POA: Diagnosis not present

## 2014-04-19 DIAGNOSIS — I2510 Atherosclerotic heart disease of native coronary artery without angina pectoris: Secondary | ICD-10-CM | POA: Diagnosis not present

## 2014-04-19 DIAGNOSIS — Z955 Presence of coronary angioplasty implant and graft: Secondary | ICD-10-CM | POA: Diagnosis not present

## 2014-04-19 DIAGNOSIS — I517 Cardiomegaly: Secondary | ICD-10-CM | POA: Diagnosis not present

## 2014-04-19 DIAGNOSIS — I351 Nonrheumatic aortic (valve) insufficiency: Secondary | ICD-10-CM | POA: Diagnosis not present

## 2014-05-08 DIAGNOSIS — I2510 Atherosclerotic heart disease of native coronary artery without angina pectoris: Secondary | ICD-10-CM | POA: Diagnosis not present

## 2014-05-08 DIAGNOSIS — I5032 Chronic diastolic (congestive) heart failure: Secondary | ICD-10-CM | POA: Diagnosis not present

## 2014-05-08 DIAGNOSIS — I110 Hypertensive heart disease with heart failure: Secondary | ICD-10-CM | POA: Diagnosis not present

## 2014-05-08 DIAGNOSIS — N189 Chronic kidney disease, unspecified: Secondary | ICD-10-CM | POA: Diagnosis not present

## 2014-05-08 DIAGNOSIS — E785 Hyperlipidemia, unspecified: Secondary | ICD-10-CM | POA: Diagnosis not present

## 2014-05-16 DIAGNOSIS — N189 Chronic kidney disease, unspecified: Secondary | ICD-10-CM | POA: Diagnosis not present

## 2014-05-16 DIAGNOSIS — D631 Anemia in chronic kidney disease: Secondary | ICD-10-CM | POA: Diagnosis not present

## 2014-06-10 DIAGNOSIS — Z23 Encounter for immunization: Secondary | ICD-10-CM | POA: Diagnosis not present

## 2014-06-12 DIAGNOSIS — N189 Chronic kidney disease, unspecified: Secondary | ICD-10-CM | POA: Diagnosis not present

## 2014-06-12 DIAGNOSIS — I5032 Chronic diastolic (congestive) heart failure: Secondary | ICD-10-CM | POA: Diagnosis not present

## 2014-06-12 DIAGNOSIS — I110 Hypertensive heart disease with heart failure: Secondary | ICD-10-CM | POA: Diagnosis not present

## 2014-06-12 DIAGNOSIS — R0602 Shortness of breath: Secondary | ICD-10-CM | POA: Diagnosis not present

## 2014-06-20 DIAGNOSIS — E780 Pure hypercholesterolemia: Secondary | ICD-10-CM | POA: Diagnosis not present

## 2014-06-20 DIAGNOSIS — I509 Heart failure, unspecified: Secondary | ICD-10-CM | POA: Diagnosis not present

## 2014-06-20 DIAGNOSIS — I10 Essential (primary) hypertension: Secondary | ICD-10-CM | POA: Diagnosis not present

## 2014-06-20 DIAGNOSIS — E119 Type 2 diabetes mellitus without complications: Secondary | ICD-10-CM | POA: Diagnosis not present

## 2014-06-20 DIAGNOSIS — J9809 Other diseases of bronchus, not elsewhere classified: Secondary | ICD-10-CM | POA: Diagnosis not present

## 2014-06-20 DIAGNOSIS — Z7982 Long term (current) use of aspirin: Secondary | ICD-10-CM | POA: Diagnosis not present

## 2014-06-20 DIAGNOSIS — I2510 Atherosclerotic heart disease of native coronary artery without angina pectoris: Secondary | ICD-10-CM | POA: Diagnosis not present

## 2014-06-20 DIAGNOSIS — J40 Bronchitis, not specified as acute or chronic: Secondary | ICD-10-CM | POA: Diagnosis not present

## 2014-07-06 DIAGNOSIS — Z9861 Coronary angioplasty status: Secondary | ICD-10-CM | POA: Diagnosis not present

## 2014-07-06 DIAGNOSIS — M109 Gout, unspecified: Secondary | ICD-10-CM | POA: Diagnosis not present

## 2014-07-06 DIAGNOSIS — R509 Fever, unspecified: Secondary | ICD-10-CM | POA: Diagnosis not present

## 2014-07-06 DIAGNOSIS — E039 Hypothyroidism, unspecified: Secondary | ICD-10-CM | POA: Diagnosis present

## 2014-07-06 DIAGNOSIS — R0902 Hypoxemia: Secondary | ICD-10-CM | POA: Diagnosis not present

## 2014-07-06 DIAGNOSIS — E780 Pure hypercholesterolemia: Secondary | ICD-10-CM | POA: Diagnosis present

## 2014-07-06 DIAGNOSIS — Z7982 Long term (current) use of aspirin: Secondary | ICD-10-CM | POA: Diagnosis not present

## 2014-07-06 DIAGNOSIS — J189 Pneumonia, unspecified organism: Secondary | ICD-10-CM | POA: Diagnosis not present

## 2014-07-06 DIAGNOSIS — K219 Gastro-esophageal reflux disease without esophagitis: Secondary | ICD-10-CM | POA: Diagnosis present

## 2014-07-06 DIAGNOSIS — J449 Chronic obstructive pulmonary disease, unspecified: Secondary | ICD-10-CM | POA: Diagnosis present

## 2014-07-06 DIAGNOSIS — I5022 Chronic systolic (congestive) heart failure: Secondary | ICD-10-CM | POA: Diagnosis present

## 2014-07-06 DIAGNOSIS — I10 Essential (primary) hypertension: Secondary | ICD-10-CM | POA: Diagnosis not present

## 2014-07-06 DIAGNOSIS — Z8701 Personal history of pneumonia (recurrent): Secondary | ICD-10-CM | POA: Diagnosis not present

## 2014-07-06 DIAGNOSIS — M10332 Gout due to renal impairment, left wrist: Secondary | ICD-10-CM | POA: Diagnosis not present

## 2014-07-06 DIAGNOSIS — I2510 Atherosclerotic heart disease of native coronary artery without angina pectoris: Secondary | ICD-10-CM | POA: Diagnosis not present

## 2014-07-06 DIAGNOSIS — Z9889 Other specified postprocedural states: Secondary | ICD-10-CM | POA: Diagnosis not present

## 2014-07-06 DIAGNOSIS — N189 Chronic kidney disease, unspecified: Secondary | ICD-10-CM | POA: Diagnosis not present

## 2014-07-06 DIAGNOSIS — N183 Chronic kidney disease, stage 3 (moderate): Secondary | ICD-10-CM | POA: Diagnosis not present

## 2014-07-06 DIAGNOSIS — R05 Cough: Secondary | ICD-10-CM | POA: Diagnosis not present

## 2014-07-06 DIAGNOSIS — Z09 Encounter for follow-up examination after completed treatment for conditions other than malignant neoplasm: Secondary | ICD-10-CM | POA: Diagnosis not present

## 2014-07-06 DIAGNOSIS — I129 Hypertensive chronic kidney disease with stage 1 through stage 4 chronic kidney disease, or unspecified chronic kidney disease: Secondary | ICD-10-CM | POA: Diagnosis not present

## 2014-07-17 DIAGNOSIS — D631 Anemia in chronic kidney disease: Secondary | ICD-10-CM | POA: Diagnosis not present

## 2014-07-17 DIAGNOSIS — N189 Chronic kidney disease, unspecified: Secondary | ICD-10-CM | POA: Diagnosis not present

## 2014-08-02 DIAGNOSIS — E785 Hyperlipidemia, unspecified: Secondary | ICD-10-CM | POA: Diagnosis not present

## 2014-08-02 DIAGNOSIS — I2510 Atherosclerotic heart disease of native coronary artery without angina pectoris: Secondary | ICD-10-CM | POA: Diagnosis not present

## 2014-08-02 DIAGNOSIS — I5032 Chronic diastolic (congestive) heart failure: Secondary | ICD-10-CM | POA: Diagnosis not present

## 2014-08-02 DIAGNOSIS — N189 Chronic kidney disease, unspecified: Secondary | ICD-10-CM | POA: Diagnosis not present

## 2014-08-02 DIAGNOSIS — I110 Hypertensive heart disease with heart failure: Secondary | ICD-10-CM | POA: Diagnosis not present

## 2014-08-02 DIAGNOSIS — R0602 Shortness of breath: Secondary | ICD-10-CM | POA: Diagnosis not present

## 2014-08-29 DIAGNOSIS — N189 Chronic kidney disease, unspecified: Secondary | ICD-10-CM | POA: Diagnosis not present

## 2014-08-29 DIAGNOSIS — D631 Anemia in chronic kidney disease: Secondary | ICD-10-CM | POA: Diagnosis not present

## 2014-10-09 DIAGNOSIS — N189 Chronic kidney disease, unspecified: Secondary | ICD-10-CM | POA: Diagnosis not present

## 2014-10-09 DIAGNOSIS — D631 Anemia in chronic kidney disease: Secondary | ICD-10-CM | POA: Diagnosis not present

## 2014-10-30 DIAGNOSIS — H35369 Drusen (degenerative) of macula, unspecified eye: Secondary | ICD-10-CM | POA: Diagnosis not present

## 2014-11-16 DIAGNOSIS — J209 Acute bronchitis, unspecified: Secondary | ICD-10-CM | POA: Diagnosis not present

## 2014-11-16 DIAGNOSIS — J40 Bronchitis, not specified as acute or chronic: Secondary | ICD-10-CM | POA: Diagnosis not present

## 2014-11-16 DIAGNOSIS — R05 Cough: Secondary | ICD-10-CM | POA: Diagnosis not present

## 2014-11-20 DIAGNOSIS — D631 Anemia in chronic kidney disease: Secondary | ICD-10-CM | POA: Diagnosis not present

## 2014-11-20 DIAGNOSIS — N189 Chronic kidney disease, unspecified: Secondary | ICD-10-CM | POA: Diagnosis not present

## 2014-11-21 DIAGNOSIS — R351 Nocturia: Secondary | ICD-10-CM | POA: Diagnosis not present

## 2014-11-21 DIAGNOSIS — N400 Enlarged prostate without lower urinary tract symptoms: Secondary | ICD-10-CM

## 2014-11-21 DIAGNOSIS — N402 Nodular prostate without lower urinary tract symptoms: Secondary | ICD-10-CM

## 2014-11-21 DIAGNOSIS — Z6824 Body mass index (BMI) 24.0-24.9, adult: Secondary | ICD-10-CM | POA: Diagnosis not present

## 2014-11-21 HISTORY — DX: Nodular prostate without lower urinary tract symptoms: N40.2

## 2014-11-21 HISTORY — DX: Nocturia: R35.1

## 2014-11-21 HISTORY — DX: Benign prostatic hyperplasia without lower urinary tract symptoms: N40.0

## 2014-12-02 DIAGNOSIS — R079 Chest pain, unspecified: Secondary | ICD-10-CM | POA: Diagnosis not present

## 2014-12-03 DIAGNOSIS — R0602 Shortness of breath: Secondary | ICD-10-CM | POA: Diagnosis not present

## 2014-12-03 DIAGNOSIS — Z888 Allergy status to other drugs, medicaments and biological substances status: Secondary | ICD-10-CM | POA: Diagnosis not present

## 2014-12-03 DIAGNOSIS — Z955 Presence of coronary angioplasty implant and graft: Secondary | ICD-10-CM | POA: Diagnosis not present

## 2014-12-03 DIAGNOSIS — I252 Old myocardial infarction: Secondary | ICD-10-CM | POA: Diagnosis not present

## 2014-12-03 DIAGNOSIS — I509 Heart failure, unspecified: Secondary | ICD-10-CM | POA: Diagnosis present

## 2014-12-03 DIAGNOSIS — J449 Chronic obstructive pulmonary disease, unspecified: Secondary | ICD-10-CM | POA: Diagnosis not present

## 2014-12-03 DIAGNOSIS — D638 Anemia in other chronic diseases classified elsewhere: Secondary | ICD-10-CM | POA: Diagnosis present

## 2014-12-03 DIAGNOSIS — R001 Bradycardia, unspecified: Secondary | ICD-10-CM | POA: Diagnosis not present

## 2014-12-03 DIAGNOSIS — N183 Chronic kidney disease, stage 3 (moderate): Secondary | ICD-10-CM | POA: Diagnosis present

## 2014-12-03 DIAGNOSIS — Z8249 Family history of ischemic heart disease and other diseases of the circulatory system: Secondary | ICD-10-CM | POA: Diagnosis not present

## 2014-12-03 DIAGNOSIS — I200 Unstable angina: Secondary | ICD-10-CM | POA: Diagnosis not present

## 2014-12-03 DIAGNOSIS — E890 Postprocedural hypothyroidism: Secondary | ICD-10-CM | POA: Diagnosis present

## 2014-12-03 DIAGNOSIS — Z7982 Long term (current) use of aspirin: Secondary | ICD-10-CM | POA: Diagnosis not present

## 2014-12-03 DIAGNOSIS — Z87891 Personal history of nicotine dependence: Secondary | ICD-10-CM | POA: Diagnosis not present

## 2014-12-03 DIAGNOSIS — R079 Chest pain, unspecified: Secondary | ICD-10-CM | POA: Diagnosis not present

## 2014-12-03 DIAGNOSIS — I129 Hypertensive chronic kidney disease with stage 1 through stage 4 chronic kidney disease, or unspecified chronic kidney disease: Secondary | ICD-10-CM | POA: Diagnosis present

## 2014-12-03 DIAGNOSIS — Z87442 Personal history of urinary calculi: Secondary | ICD-10-CM | POA: Diagnosis not present

## 2014-12-03 DIAGNOSIS — K219 Gastro-esophageal reflux disease without esophagitis: Secondary | ICD-10-CM | POA: Diagnosis present

## 2014-12-03 DIAGNOSIS — I447 Left bundle-branch block, unspecified: Secondary | ICD-10-CM | POA: Diagnosis present

## 2014-12-03 DIAGNOSIS — I495 Sick sinus syndrome: Secondary | ICD-10-CM | POA: Diagnosis not present

## 2014-12-03 DIAGNOSIS — K5790 Diverticulosis of intestine, part unspecified, without perforation or abscess without bleeding: Secondary | ICD-10-CM | POA: Diagnosis present

## 2014-12-03 DIAGNOSIS — N400 Enlarged prostate without lower urinary tract symptoms: Secondary | ICD-10-CM | POA: Diagnosis present

## 2014-12-03 DIAGNOSIS — R072 Precordial pain: Secondary | ICD-10-CM | POA: Diagnosis not present

## 2014-12-03 DIAGNOSIS — R0789 Other chest pain: Secondary | ICD-10-CM | POA: Diagnosis not present

## 2014-12-03 DIAGNOSIS — I2510 Atherosclerotic heart disease of native coronary artery without angina pectoris: Secondary | ICD-10-CM | POA: Diagnosis not present

## 2014-12-03 DIAGNOSIS — E780 Pure hypercholesterolemia: Secondary | ICD-10-CM | POA: Diagnosis present

## 2014-12-03 DIAGNOSIS — I10 Essential (primary) hypertension: Secondary | ICD-10-CM | POA: Diagnosis not present

## 2014-12-03 DIAGNOSIS — I25110 Atherosclerotic heart disease of native coronary artery with unstable angina pectoris: Secondary | ICD-10-CM | POA: Diagnosis not present

## 2014-12-21 DIAGNOSIS — I5022 Chronic systolic (congestive) heart failure: Secondary | ICD-10-CM | POA: Diagnosis present

## 2014-12-21 DIAGNOSIS — R55 Syncope and collapse: Secondary | ICD-10-CM | POA: Diagnosis not present

## 2014-12-21 DIAGNOSIS — I442 Atrioventricular block, complete: Secondary | ICD-10-CM | POA: Diagnosis not present

## 2014-12-21 DIAGNOSIS — K5790 Diverticulosis of intestine, part unspecified, without perforation or abscess without bleeding: Secondary | ICD-10-CM | POA: Diagnosis present

## 2014-12-21 DIAGNOSIS — Z87891 Personal history of nicotine dependence: Secondary | ICD-10-CM | POA: Diagnosis not present

## 2014-12-21 DIAGNOSIS — Z8674 Personal history of sudden cardiac arrest: Secondary | ICD-10-CM | POA: Diagnosis not present

## 2014-12-21 DIAGNOSIS — Z45010 Encounter for checking and testing of cardiac pacemaker pulse generator [battery]: Secondary | ICD-10-CM | POA: Diagnosis not present

## 2014-12-21 DIAGNOSIS — M25571 Pain in right ankle and joints of right foot: Secondary | ICD-10-CM | POA: Diagnosis not present

## 2014-12-21 DIAGNOSIS — I129 Hypertensive chronic kidney disease with stage 1 through stage 4 chronic kidney disease, or unspecified chronic kidney disease: Secondary | ICD-10-CM | POA: Diagnosis present

## 2014-12-21 DIAGNOSIS — I455 Other specified heart block: Secondary | ICD-10-CM | POA: Diagnosis not present

## 2014-12-21 DIAGNOSIS — Z791 Long term (current) use of non-steroidal anti-inflammatories (NSAID): Secondary | ICD-10-CM | POA: Diagnosis not present

## 2014-12-21 DIAGNOSIS — R001 Bradycardia, unspecified: Secondary | ICD-10-CM | POA: Diagnosis not present

## 2014-12-21 DIAGNOSIS — Z7902 Long term (current) use of antithrombotics/antiplatelets: Secondary | ICD-10-CM | POA: Diagnosis not present

## 2014-12-21 DIAGNOSIS — Z955 Presence of coronary angioplasty implant and graft: Secondary | ICD-10-CM | POA: Diagnosis not present

## 2014-12-21 DIAGNOSIS — Z79899 Other long term (current) drug therapy: Secondary | ICD-10-CM | POA: Diagnosis not present

## 2014-12-21 DIAGNOSIS — I2510 Atherosclerotic heart disease of native coronary artery without angina pectoris: Secondary | ICD-10-CM | POA: Diagnosis not present

## 2014-12-21 DIAGNOSIS — Z7982 Long term (current) use of aspirin: Secondary | ICD-10-CM | POA: Diagnosis not present

## 2014-12-21 DIAGNOSIS — E780 Pure hypercholesterolemia: Secondary | ICD-10-CM | POA: Diagnosis present

## 2014-12-21 DIAGNOSIS — I499 Cardiac arrhythmia, unspecified: Secondary | ICD-10-CM | POA: Diagnosis not present

## 2014-12-21 DIAGNOSIS — Z85828 Personal history of other malignant neoplasm of skin: Secondary | ICD-10-CM | POA: Diagnosis not present

## 2014-12-21 DIAGNOSIS — Z888 Allergy status to other drugs, medicaments and biological substances status: Secondary | ICD-10-CM | POA: Diagnosis not present

## 2014-12-21 DIAGNOSIS — I340 Nonrheumatic mitral (valve) insufficiency: Secondary | ICD-10-CM | POA: Diagnosis not present

## 2014-12-21 DIAGNOSIS — K219 Gastro-esophageal reflux disease without esophagitis: Secondary | ICD-10-CM | POA: Diagnosis present

## 2014-12-21 DIAGNOSIS — Z950 Presence of cardiac pacemaker: Secondary | ICD-10-CM | POA: Diagnosis not present

## 2014-12-21 DIAGNOSIS — N400 Enlarged prostate without lower urinary tract symptoms: Secondary | ICD-10-CM | POA: Diagnosis present

## 2014-12-21 DIAGNOSIS — Z87442 Personal history of urinary calculi: Secondary | ICD-10-CM | POA: Diagnosis not present

## 2014-12-21 DIAGNOSIS — I361 Nonrheumatic tricuspid (valve) insufficiency: Secondary | ICD-10-CM | POA: Diagnosis not present

## 2014-12-21 DIAGNOSIS — Z8249 Family history of ischemic heart disease and other diseases of the circulatory system: Secondary | ICD-10-CM | POA: Diagnosis not present

## 2014-12-21 DIAGNOSIS — E890 Postprocedural hypothyroidism: Secondary | ICD-10-CM | POA: Diagnosis present

## 2014-12-21 DIAGNOSIS — J449 Chronic obstructive pulmonary disease, unspecified: Secondary | ICD-10-CM | POA: Diagnosis present

## 2014-12-21 DIAGNOSIS — I447 Left bundle-branch block, unspecified: Secondary | ICD-10-CM | POA: Diagnosis not present

## 2014-12-21 DIAGNOSIS — F329 Major depressive disorder, single episode, unspecified: Secondary | ICD-10-CM | POA: Diagnosis present

## 2014-12-21 DIAGNOSIS — R5383 Other fatigue: Secondary | ICD-10-CM | POA: Diagnosis not present

## 2014-12-21 DIAGNOSIS — N183 Chronic kidney disease, stage 3 (moderate): Secondary | ICD-10-CM | POA: Diagnosis not present

## 2014-12-21 DIAGNOSIS — I495 Sick sinus syndrome: Secondary | ICD-10-CM | POA: Diagnosis not present

## 2014-12-21 DIAGNOSIS — M1990 Unspecified osteoarthritis, unspecified site: Secondary | ICD-10-CM | POA: Diagnosis present

## 2014-12-21 DIAGNOSIS — N189 Chronic kidney disease, unspecified: Secondary | ICD-10-CM | POA: Diagnosis not present

## 2014-12-21 DIAGNOSIS — D638 Anemia in other chronic diseases classified elsewhere: Secondary | ICD-10-CM | POA: Diagnosis present

## 2014-12-24 DIAGNOSIS — M25571 Pain in right ankle and joints of right foot: Secondary | ICD-10-CM | POA: Diagnosis not present

## 2014-12-24 DIAGNOSIS — I495 Sick sinus syndrome: Secondary | ICD-10-CM

## 2014-12-24 DIAGNOSIS — R55 Syncope and collapse: Secondary | ICD-10-CM | POA: Diagnosis not present

## 2014-12-24 DIAGNOSIS — R5383 Other fatigue: Secondary | ICD-10-CM | POA: Diagnosis not present

## 2014-12-24 DIAGNOSIS — N189 Chronic kidney disease, unspecified: Secondary | ICD-10-CM | POA: Diagnosis not present

## 2014-12-24 DIAGNOSIS — Z79899 Other long term (current) drug therapy: Secondary | ICD-10-CM | POA: Diagnosis not present

## 2014-12-24 HISTORY — DX: Sick sinus syndrome: I49.5

## 2014-12-25 DIAGNOSIS — R5383 Other fatigue: Secondary | ICD-10-CM | POA: Diagnosis not present

## 2014-12-25 DIAGNOSIS — R55 Syncope and collapse: Secondary | ICD-10-CM | POA: Diagnosis not present

## 2014-12-25 DIAGNOSIS — M25571 Pain in right ankle and joints of right foot: Secondary | ICD-10-CM | POA: Diagnosis not present

## 2014-12-25 DIAGNOSIS — I495 Sick sinus syndrome: Secondary | ICD-10-CM | POA: Diagnosis not present

## 2014-12-25 DIAGNOSIS — N189 Chronic kidney disease, unspecified: Secondary | ICD-10-CM | POA: Diagnosis not present

## 2014-12-25 DIAGNOSIS — Z79899 Other long term (current) drug therapy: Secondary | ICD-10-CM | POA: Diagnosis not present

## 2014-12-26 DIAGNOSIS — M25571 Pain in right ankle and joints of right foot: Secondary | ICD-10-CM | POA: Diagnosis not present

## 2014-12-26 DIAGNOSIS — Z79899 Other long term (current) drug therapy: Secondary | ICD-10-CM | POA: Diagnosis not present

## 2014-12-26 DIAGNOSIS — I495 Sick sinus syndrome: Secondary | ICD-10-CM | POA: Diagnosis not present

## 2014-12-26 DIAGNOSIS — N189 Chronic kidney disease, unspecified: Secondary | ICD-10-CM | POA: Diagnosis not present

## 2014-12-26 DIAGNOSIS — R55 Syncope and collapse: Secondary | ICD-10-CM | POA: Diagnosis not present

## 2014-12-26 DIAGNOSIS — R5383 Other fatigue: Secondary | ICD-10-CM | POA: Diagnosis not present

## 2015-01-03 DIAGNOSIS — N189 Chronic kidney disease, unspecified: Secondary | ICD-10-CM | POA: Diagnosis not present

## 2015-01-03 DIAGNOSIS — Z950 Presence of cardiac pacemaker: Secondary | ICD-10-CM | POA: Diagnosis not present

## 2015-01-03 DIAGNOSIS — D631 Anemia in chronic kidney disease: Secondary | ICD-10-CM | POA: Diagnosis not present

## 2015-01-04 DIAGNOSIS — Z45010 Encounter for checking and testing of cardiac pacemaker pulse generator [battery]: Secondary | ICD-10-CM | POA: Diagnosis not present

## 2015-01-04 DIAGNOSIS — I495 Sick sinus syndrome: Secondary | ICD-10-CM | POA: Diagnosis not present

## 2015-01-16 DIAGNOSIS — I517 Cardiomegaly: Secondary | ICD-10-CM | POA: Diagnosis not present

## 2015-01-16 DIAGNOSIS — Z950 Presence of cardiac pacemaker: Secondary | ICD-10-CM | POA: Diagnosis not present

## 2015-01-30 DIAGNOSIS — H6690 Otitis media, unspecified, unspecified ear: Secondary | ICD-10-CM | POA: Diagnosis not present

## 2015-02-19 DIAGNOSIS — N189 Chronic kidney disease, unspecified: Secondary | ICD-10-CM | POA: Diagnosis not present

## 2015-02-19 DIAGNOSIS — D631 Anemia in chronic kidney disease: Secondary | ICD-10-CM | POA: Diagnosis not present

## 2015-03-26 DIAGNOSIS — Z45010 Encounter for checking and testing of cardiac pacemaker pulse generator [battery]: Secondary | ICD-10-CM | POA: Diagnosis not present

## 2015-03-26 DIAGNOSIS — I498 Other specified cardiac arrhythmias: Secondary | ICD-10-CM | POA: Diagnosis not present

## 2015-03-27 DIAGNOSIS — J40 Bronchitis, not specified as acute or chronic: Secondary | ICD-10-CM | POA: Diagnosis not present

## 2015-03-27 DIAGNOSIS — R05 Cough: Secondary | ICD-10-CM | POA: Diagnosis not present

## 2015-05-05 DIAGNOSIS — E7800 Pure hypercholesterolemia, unspecified: Secondary | ICD-10-CM | POA: Diagnosis not present

## 2015-05-05 DIAGNOSIS — I2510 Atherosclerotic heart disease of native coronary artery without angina pectoris: Secondary | ICD-10-CM | POA: Diagnosis not present

## 2015-05-05 DIAGNOSIS — R079 Chest pain, unspecified: Secondary | ICD-10-CM | POA: Diagnosis not present

## 2015-05-05 DIAGNOSIS — J209 Acute bronchitis, unspecified: Secondary | ICD-10-CM | POA: Diagnosis not present

## 2015-05-05 DIAGNOSIS — R0602 Shortness of breath: Secondary | ICD-10-CM | POA: Diagnosis not present

## 2015-05-05 DIAGNOSIS — N183 Chronic kidney disease, stage 3 (moderate): Secondary | ICD-10-CM | POA: Diagnosis not present

## 2015-05-05 DIAGNOSIS — D509 Iron deficiency anemia, unspecified: Secondary | ICD-10-CM | POA: Diagnosis not present

## 2015-05-05 DIAGNOSIS — Z79899 Other long term (current) drug therapy: Secondary | ICD-10-CM | POA: Diagnosis not present

## 2015-05-05 DIAGNOSIS — J189 Pneumonia, unspecified organism: Secondary | ICD-10-CM | POA: Diagnosis not present

## 2015-05-05 DIAGNOSIS — K219 Gastro-esophageal reflux disease without esophagitis: Secondary | ICD-10-CM | POA: Diagnosis not present

## 2015-05-05 DIAGNOSIS — E039 Hypothyroidism, unspecified: Secondary | ICD-10-CM | POA: Diagnosis not present

## 2015-05-05 DIAGNOSIS — R05 Cough: Secondary | ICD-10-CM | POA: Diagnosis not present

## 2015-05-05 DIAGNOSIS — E119 Type 2 diabetes mellitus without complications: Secondary | ICD-10-CM | POA: Diagnosis not present

## 2015-05-05 DIAGNOSIS — I129 Hypertensive chronic kidney disease with stage 1 through stage 4 chronic kidney disease, or unspecified chronic kidney disease: Secondary | ICD-10-CM | POA: Diagnosis not present

## 2015-05-13 DIAGNOSIS — N189 Chronic kidney disease, unspecified: Secondary | ICD-10-CM | POA: Diagnosis not present

## 2015-05-13 DIAGNOSIS — D631 Anemia in chronic kidney disease: Secondary | ICD-10-CM | POA: Diagnosis not present

## 2015-05-21 DIAGNOSIS — Z45018 Encounter for adjustment and management of other part of cardiac pacemaker: Secondary | ICD-10-CM

## 2015-05-21 DIAGNOSIS — Z950 Presence of cardiac pacemaker: Secondary | ICD-10-CM

## 2015-05-21 DIAGNOSIS — I5042 Chronic combined systolic (congestive) and diastolic (congestive) heart failure: Secondary | ICD-10-CM

## 2015-05-21 HISTORY — DX: Chronic combined systolic (congestive) and diastolic (congestive) heart failure: I50.42

## 2015-05-21 HISTORY — DX: Presence of cardiac pacemaker: Z95.0

## 2015-05-21 HISTORY — DX: Encounter for adjustment and management of other part of cardiac pacemaker: Z45.018

## 2015-05-22 DIAGNOSIS — I25119 Atherosclerotic heart disease of native coronary artery with unspecified angina pectoris: Secondary | ICD-10-CM | POA: Diagnosis not present

## 2015-05-22 DIAGNOSIS — Z6826 Body mass index (BMI) 26.0-26.9, adult: Secondary | ICD-10-CM | POA: Diagnosis not present

## 2015-05-22 DIAGNOSIS — E785 Hyperlipidemia, unspecified: Secondary | ICD-10-CM | POA: Diagnosis not present

## 2015-05-22 DIAGNOSIS — I5042 Chronic combined systolic (congestive) and diastolic (congestive) heart failure: Secondary | ICD-10-CM | POA: Diagnosis not present

## 2015-05-22 DIAGNOSIS — R0602 Shortness of breath: Secondary | ICD-10-CM | POA: Diagnosis not present

## 2015-05-23 DIAGNOSIS — E039 Hypothyroidism, unspecified: Secondary | ICD-10-CM | POA: Diagnosis not present

## 2015-05-23 DIAGNOSIS — R5383 Other fatigue: Secondary | ICD-10-CM | POA: Diagnosis not present

## 2015-05-23 DIAGNOSIS — I509 Heart failure, unspecified: Secondary | ICD-10-CM | POA: Diagnosis not present

## 2015-05-23 DIAGNOSIS — Z79899 Other long term (current) drug therapy: Secondary | ICD-10-CM | POA: Diagnosis not present

## 2015-05-23 DIAGNOSIS — I10 Essential (primary) hypertension: Secondary | ICD-10-CM | POA: Diagnosis not present

## 2015-05-23 DIAGNOSIS — D509 Iron deficiency anemia, unspecified: Secondary | ICD-10-CM | POA: Diagnosis not present

## 2015-05-23 DIAGNOSIS — Z0000 Encounter for general adult medical examination without abnormal findings: Secondary | ICD-10-CM | POA: Diagnosis not present

## 2015-05-23 DIAGNOSIS — R0602 Shortness of breath: Secondary | ICD-10-CM | POA: Diagnosis not present

## 2015-06-11 DIAGNOSIS — I5042 Chronic combined systolic (congestive) and diastolic (congestive) heart failure: Secondary | ICD-10-CM | POA: Diagnosis not present

## 2015-06-11 DIAGNOSIS — E785 Hyperlipidemia, unspecified: Secondary | ICD-10-CM | POA: Diagnosis not present

## 2015-06-11 DIAGNOSIS — Z6827 Body mass index (BMI) 27.0-27.9, adult: Secondary | ICD-10-CM | POA: Diagnosis not present

## 2015-06-11 DIAGNOSIS — I110 Hypertensive heart disease with heart failure: Secondary | ICD-10-CM | POA: Diagnosis not present

## 2015-06-11 DIAGNOSIS — N183 Chronic kidney disease, stage 3 (moderate): Secondary | ICD-10-CM | POA: Diagnosis not present

## 2015-06-27 DIAGNOSIS — D631 Anemia in chronic kidney disease: Secondary | ICD-10-CM | POA: Diagnosis not present

## 2015-06-27 DIAGNOSIS — N189 Chronic kidney disease, unspecified: Secondary | ICD-10-CM | POA: Diagnosis not present

## 2015-07-17 DIAGNOSIS — R05 Cough: Secondary | ICD-10-CM | POA: Diagnosis not present

## 2015-07-17 DIAGNOSIS — I509 Heart failure, unspecified: Secondary | ICD-10-CM | POA: Diagnosis not present

## 2015-07-29 DIAGNOSIS — I5042 Chronic combined systolic (congestive) and diastolic (congestive) heart failure: Secondary | ICD-10-CM | POA: Diagnosis not present

## 2015-07-29 DIAGNOSIS — I25119 Atherosclerotic heart disease of native coronary artery with unspecified angina pectoris: Secondary | ICD-10-CM | POA: Diagnosis not present

## 2015-07-30 DIAGNOSIS — R05 Cough: Secondary | ICD-10-CM | POA: Diagnosis not present

## 2015-07-30 DIAGNOSIS — J209 Acute bronchitis, unspecified: Secondary | ICD-10-CM | POA: Diagnosis not present

## 2015-07-30 DIAGNOSIS — R509 Fever, unspecified: Secondary | ICD-10-CM | POA: Diagnosis not present

## 2015-07-30 DIAGNOSIS — R531 Weakness: Secondary | ICD-10-CM | POA: Diagnosis not present

## 2015-08-05 DIAGNOSIS — D631 Anemia in chronic kidney disease: Secondary | ICD-10-CM | POA: Diagnosis not present

## 2015-08-05 DIAGNOSIS — N189 Chronic kidney disease, unspecified: Secondary | ICD-10-CM | POA: Diagnosis not present

## 2015-09-17 DIAGNOSIS — L300 Nummular dermatitis: Secondary | ICD-10-CM | POA: Diagnosis not present

## 2015-09-17 DIAGNOSIS — L638 Other alopecia areata: Secondary | ICD-10-CM | POA: Diagnosis not present

## 2015-09-19 DIAGNOSIS — R05 Cough: Secondary | ICD-10-CM | POA: Diagnosis not present

## 2015-09-19 DIAGNOSIS — R0602 Shortness of breath: Secondary | ICD-10-CM | POA: Diagnosis not present

## 2015-09-19 DIAGNOSIS — Z888 Allergy status to other drugs, medicaments and biological substances status: Secondary | ICD-10-CM | POA: Diagnosis not present

## 2015-09-19 DIAGNOSIS — Z7982 Long term (current) use of aspirin: Secondary | ICD-10-CM | POA: Diagnosis not present

## 2015-09-19 DIAGNOSIS — Z79899 Other long term (current) drug therapy: Secondary | ICD-10-CM | POA: Diagnosis not present

## 2015-09-19 DIAGNOSIS — Z87891 Personal history of nicotine dependence: Secondary | ICD-10-CM | POA: Diagnosis not present

## 2015-09-19 DIAGNOSIS — J069 Acute upper respiratory infection, unspecified: Secondary | ICD-10-CM | POA: Diagnosis not present

## 2015-10-07 DIAGNOSIS — N183 Chronic kidney disease, stage 3 (moderate): Secondary | ICD-10-CM | POA: Diagnosis not present

## 2015-10-08 DIAGNOSIS — D631 Anemia in chronic kidney disease: Secondary | ICD-10-CM | POA: Diagnosis not present

## 2015-10-08 DIAGNOSIS — N189 Chronic kidney disease, unspecified: Secondary | ICD-10-CM | POA: Diagnosis not present

## 2015-10-17 DIAGNOSIS — N189 Chronic kidney disease, unspecified: Secondary | ICD-10-CM | POA: Diagnosis not present

## 2015-10-17 DIAGNOSIS — E039 Hypothyroidism, unspecified: Secondary | ICD-10-CM | POA: Diagnosis not present

## 2015-10-17 DIAGNOSIS — Z0000 Encounter for general adult medical examination without abnormal findings: Secondary | ICD-10-CM | POA: Diagnosis not present

## 2015-10-17 DIAGNOSIS — Z9181 History of falling: Secondary | ICD-10-CM | POA: Diagnosis not present

## 2015-10-17 DIAGNOSIS — Z1389 Encounter for screening for other disorder: Secondary | ICD-10-CM | POA: Diagnosis not present

## 2015-10-28 DIAGNOSIS — I5042 Chronic combined systolic (congestive) and diastolic (congestive) heart failure: Secondary | ICD-10-CM | POA: Diagnosis not present

## 2015-10-28 DIAGNOSIS — Z6825 Body mass index (BMI) 25.0-25.9, adult: Secondary | ICD-10-CM | POA: Diagnosis not present

## 2015-10-28 DIAGNOSIS — Z950 Presence of cardiac pacemaker: Secondary | ICD-10-CM | POA: Diagnosis not present

## 2015-10-28 DIAGNOSIS — I495 Sick sinus syndrome: Secondary | ICD-10-CM | POA: Diagnosis not present

## 2015-10-28 DIAGNOSIS — I25119 Atherosclerotic heart disease of native coronary artery with unspecified angina pectoris: Secondary | ICD-10-CM | POA: Diagnosis not present

## 2015-10-31 DIAGNOSIS — I25119 Atherosclerotic heart disease of native coronary artery with unspecified angina pectoris: Secondary | ICD-10-CM | POA: Diagnosis not present

## 2015-10-31 DIAGNOSIS — I5042 Chronic combined systolic (congestive) and diastolic (congestive) heart failure: Secondary | ICD-10-CM | POA: Diagnosis not present

## 2015-12-03 DIAGNOSIS — E7800 Pure hypercholesterolemia, unspecified: Secondary | ICD-10-CM | POA: Diagnosis not present

## 2015-12-03 DIAGNOSIS — R079 Chest pain, unspecified: Secondary | ICD-10-CM | POA: Diagnosis not present

## 2015-12-03 DIAGNOSIS — I208 Other forms of angina pectoris: Secondary | ICD-10-CM | POA: Diagnosis not present

## 2015-12-03 DIAGNOSIS — I130 Hypertensive heart and chronic kidney disease with heart failure and stage 1 through stage 4 chronic kidney disease, or unspecified chronic kidney disease: Secondary | ICD-10-CM | POA: Diagnosis not present

## 2015-12-03 DIAGNOSIS — Z7902 Long term (current) use of antithrombotics/antiplatelets: Secondary | ICD-10-CM | POA: Diagnosis not present

## 2015-12-03 DIAGNOSIS — E1122 Type 2 diabetes mellitus with diabetic chronic kidney disease: Secondary | ICD-10-CM | POA: Diagnosis not present

## 2015-12-03 DIAGNOSIS — Z7982 Long term (current) use of aspirin: Secondary | ICD-10-CM | POA: Diagnosis not present

## 2015-12-03 DIAGNOSIS — D509 Iron deficiency anemia, unspecified: Secondary | ICD-10-CM | POA: Diagnosis not present

## 2015-12-03 DIAGNOSIS — N189 Chronic kidney disease, unspecified: Secondary | ICD-10-CM | POA: Diagnosis not present

## 2015-12-03 DIAGNOSIS — J449 Chronic obstructive pulmonary disease, unspecified: Secondary | ICD-10-CM | POA: Diagnosis not present

## 2015-12-03 DIAGNOSIS — I2510 Atherosclerotic heart disease of native coronary artery without angina pectoris: Secondary | ICD-10-CM | POA: Diagnosis not present

## 2015-12-03 DIAGNOSIS — I509 Heart failure, unspecified: Secondary | ICD-10-CM | POA: Diagnosis not present

## 2015-12-03 DIAGNOSIS — Z79899 Other long term (current) drug therapy: Secondary | ICD-10-CM | POA: Diagnosis not present

## 2015-12-03 DIAGNOSIS — D631 Anemia in chronic kidney disease: Secondary | ICD-10-CM | POA: Diagnosis not present

## 2015-12-03 DIAGNOSIS — N183 Chronic kidney disease, stage 3 (moderate): Secondary | ICD-10-CM | POA: Diagnosis not present

## 2015-12-03 DIAGNOSIS — E039 Hypothyroidism, unspecified: Secondary | ICD-10-CM | POA: Diagnosis not present

## 2016-01-14 DIAGNOSIS — I10 Essential (primary) hypertension: Secondary | ICD-10-CM | POA: Diagnosis not present

## 2016-01-14 DIAGNOSIS — I252 Old myocardial infarction: Secondary | ICD-10-CM | POA: Diagnosis not present

## 2016-01-14 DIAGNOSIS — A419 Sepsis, unspecified organism: Secondary | ICD-10-CM | POA: Diagnosis not present

## 2016-01-14 DIAGNOSIS — R0602 Shortness of breath: Secondary | ICD-10-CM | POA: Diagnosis not present

## 2016-01-14 DIAGNOSIS — I2510 Atherosclerotic heart disease of native coronary artery without angina pectoris: Secondary | ICD-10-CM | POA: Diagnosis not present

## 2016-01-14 DIAGNOSIS — E119 Type 2 diabetes mellitus without complications: Secondary | ICD-10-CM | POA: Diagnosis not present

## 2016-01-16 DIAGNOSIS — H6123 Impacted cerumen, bilateral: Secondary | ICD-10-CM | POA: Diagnosis not present

## 2016-01-16 DIAGNOSIS — H938X3 Other specified disorders of ear, bilateral: Secondary | ICD-10-CM | POA: Diagnosis not present

## 2016-01-27 DIAGNOSIS — I5042 Chronic combined systolic (congestive) and diastolic (congestive) heart failure: Secondary | ICD-10-CM | POA: Diagnosis not present

## 2016-01-27 DIAGNOSIS — Z6825 Body mass index (BMI) 25.0-25.9, adult: Secondary | ICD-10-CM | POA: Diagnosis not present

## 2016-01-27 DIAGNOSIS — I110 Hypertensive heart disease with heart failure: Secondary | ICD-10-CM | POA: Diagnosis not present

## 2016-01-27 DIAGNOSIS — Z950 Presence of cardiac pacemaker: Secondary | ICD-10-CM | POA: Diagnosis not present

## 2016-01-27 DIAGNOSIS — I25119 Atherosclerotic heart disease of native coronary artery with unspecified angina pectoris: Secondary | ICD-10-CM | POA: Diagnosis not present

## 2016-01-30 DIAGNOSIS — Z45018 Encounter for adjustment and management of other part of cardiac pacemaker: Secondary | ICD-10-CM | POA: Diagnosis not present

## 2016-01-30 DIAGNOSIS — R55 Syncope and collapse: Secondary | ICD-10-CM | POA: Diagnosis not present

## 2016-02-03 DIAGNOSIS — H26493 Other secondary cataract, bilateral: Secondary | ICD-10-CM | POA: Diagnosis not present

## 2016-02-03 DIAGNOSIS — H524 Presbyopia: Secondary | ICD-10-CM | POA: Diagnosis not present

## 2016-02-19 DIAGNOSIS — R5382 Chronic fatigue, unspecified: Secondary | ICD-10-CM | POA: Diagnosis not present

## 2016-02-19 DIAGNOSIS — N401 Enlarged prostate with lower urinary tract symptoms: Secondary | ICD-10-CM | POA: Diagnosis not present

## 2016-03-12 DIAGNOSIS — N189 Chronic kidney disease, unspecified: Secondary | ICD-10-CM | POA: Diagnosis not present

## 2016-03-12 DIAGNOSIS — D631 Anemia in chronic kidney disease: Secondary | ICD-10-CM | POA: Diagnosis not present

## 2016-04-16 DIAGNOSIS — I25119 Atherosclerotic heart disease of native coronary artery with unspecified angina pectoris: Secondary | ICD-10-CM | POA: Diagnosis not present

## 2016-04-16 DIAGNOSIS — Z45018 Encounter for adjustment and management of other part of cardiac pacemaker: Secondary | ICD-10-CM | POA: Diagnosis not present

## 2016-04-16 DIAGNOSIS — R0602 Shortness of breath: Secondary | ICD-10-CM | POA: Diagnosis not present

## 2016-04-16 DIAGNOSIS — Z6825 Body mass index (BMI) 25.0-25.9, adult: Secondary | ICD-10-CM | POA: Diagnosis not present

## 2016-04-16 DIAGNOSIS — E785 Hyperlipidemia, unspecified: Secondary | ICD-10-CM | POA: Diagnosis not present

## 2016-04-16 DIAGNOSIS — R079 Chest pain, unspecified: Secondary | ICD-10-CM | POA: Diagnosis not present

## 2016-04-16 DIAGNOSIS — I5042 Chronic combined systolic (congestive) and diastolic (congestive) heart failure: Secondary | ICD-10-CM | POA: Diagnosis not present

## 2016-04-16 DIAGNOSIS — I10 Essential (primary) hypertension: Secondary | ICD-10-CM | POA: Diagnosis not present

## 2016-04-21 DIAGNOSIS — E785 Hyperlipidemia, unspecified: Secondary | ICD-10-CM | POA: Diagnosis not present

## 2016-04-21 DIAGNOSIS — Z87891 Personal history of nicotine dependence: Secondary | ICD-10-CM | POA: Diagnosis not present

## 2016-04-21 DIAGNOSIS — N189 Chronic kidney disease, unspecified: Secondary | ICD-10-CM | POA: Diagnosis not present

## 2016-04-21 DIAGNOSIS — K219 Gastro-esophageal reflux disease without esophagitis: Secondary | ICD-10-CM | POA: Diagnosis not present

## 2016-04-21 DIAGNOSIS — R0602 Shortness of breath: Secondary | ICD-10-CM | POA: Diagnosis not present

## 2016-04-21 DIAGNOSIS — Z7982 Long term (current) use of aspirin: Secondary | ICD-10-CM | POA: Diagnosis not present

## 2016-04-21 DIAGNOSIS — Z79899 Other long term (current) drug therapy: Secondary | ICD-10-CM | POA: Diagnosis not present

## 2016-04-21 DIAGNOSIS — Z955 Presence of coronary angioplasty implant and graft: Secondary | ICD-10-CM | POA: Diagnosis not present

## 2016-04-21 DIAGNOSIS — J8410 Pulmonary fibrosis, unspecified: Secondary | ICD-10-CM | POA: Diagnosis not present

## 2016-04-21 DIAGNOSIS — Z8249 Family history of ischemic heart disease and other diseases of the circulatory system: Secondary | ICD-10-CM | POA: Diagnosis not present

## 2016-04-21 DIAGNOSIS — I25118 Atherosclerotic heart disease of native coronary artery with other forms of angina pectoris: Secondary | ICD-10-CM | POA: Diagnosis not present

## 2016-04-21 DIAGNOSIS — E039 Hypothyroidism, unspecified: Secondary | ICD-10-CM | POA: Diagnosis not present

## 2016-04-21 DIAGNOSIS — I130 Hypertensive heart and chronic kidney disease with heart failure and stage 1 through stage 4 chronic kidney disease, or unspecified chronic kidney disease: Secondary | ICD-10-CM | POA: Diagnosis not present

## 2016-04-21 DIAGNOSIS — I252 Old myocardial infarction: Secondary | ICD-10-CM | POA: Diagnosis not present

## 2016-04-21 DIAGNOSIS — I5042 Chronic combined systolic (congestive) and diastolic (congestive) heart failure: Secondary | ICD-10-CM | POA: Diagnosis not present

## 2016-06-04 DIAGNOSIS — D631 Anemia in chronic kidney disease: Secondary | ICD-10-CM | POA: Diagnosis not present

## 2016-06-04 DIAGNOSIS — N189 Chronic kidney disease, unspecified: Secondary | ICD-10-CM | POA: Diagnosis not present

## 2016-06-22 DIAGNOSIS — I10 Essential (primary) hypertension: Secondary | ICD-10-CM | POA: Diagnosis not present

## 2016-06-22 DIAGNOSIS — I509 Heart failure, unspecified: Secondary | ICD-10-CM | POA: Diagnosis not present

## 2016-07-13 DIAGNOSIS — Z23 Encounter for immunization: Secondary | ICD-10-CM | POA: Diagnosis not present

## 2016-08-04 DIAGNOSIS — Z950 Presence of cardiac pacemaker: Secondary | ICD-10-CM | POA: Diagnosis not present

## 2016-10-13 DIAGNOSIS — I10 Essential (primary) hypertension: Secondary | ICD-10-CM | POA: Diagnosis not present

## 2016-10-13 DIAGNOSIS — Z45018 Encounter for adjustment and management of other part of cardiac pacemaker: Secondary | ICD-10-CM | POA: Diagnosis not present

## 2016-10-13 DIAGNOSIS — I2510 Atherosclerotic heart disease of native coronary artery without angina pectoris: Secondary | ICD-10-CM | POA: Diagnosis not present

## 2016-10-13 DIAGNOSIS — I5042 Chronic combined systolic (congestive) and diastolic (congestive) heart failure: Secondary | ICD-10-CM | POA: Diagnosis not present

## 2016-10-13 DIAGNOSIS — E785 Hyperlipidemia, unspecified: Secondary | ICD-10-CM | POA: Diagnosis not present

## 2016-10-13 DIAGNOSIS — R0602 Shortness of breath: Secondary | ICD-10-CM | POA: Diagnosis not present

## 2016-10-28 DIAGNOSIS — E039 Hypothyroidism, unspecified: Secondary | ICD-10-CM | POA: Diagnosis not present

## 2016-10-28 DIAGNOSIS — Z79899 Other long term (current) drug therapy: Secondary | ICD-10-CM | POA: Diagnosis not present

## 2016-10-28 DIAGNOSIS — Z6825 Body mass index (BMI) 25.0-25.9, adult: Secondary | ICD-10-CM | POA: Diagnosis not present

## 2016-10-28 DIAGNOSIS — Z0000 Encounter for general adult medical examination without abnormal findings: Secondary | ICD-10-CM | POA: Diagnosis not present

## 2016-10-28 DIAGNOSIS — R5383 Other fatigue: Secondary | ICD-10-CM | POA: Diagnosis not present

## 2016-10-28 DIAGNOSIS — D649 Anemia, unspecified: Secondary | ICD-10-CM | POA: Diagnosis not present

## 2016-10-28 DIAGNOSIS — I259 Chronic ischemic heart disease, unspecified: Secondary | ICD-10-CM | POA: Diagnosis not present

## 2016-11-03 DIAGNOSIS — Z950 Presence of cardiac pacemaker: Secondary | ICD-10-CM | POA: Diagnosis not present

## 2016-11-10 DIAGNOSIS — H6123 Impacted cerumen, bilateral: Secondary | ICD-10-CM | POA: Diagnosis not present

## 2016-11-17 DIAGNOSIS — I2510 Atherosclerotic heart disease of native coronary artery without angina pectoris: Secondary | ICD-10-CM | POA: Diagnosis not present

## 2016-11-17 DIAGNOSIS — E785 Hyperlipidemia, unspecified: Secondary | ICD-10-CM | POA: Diagnosis not present

## 2016-12-19 DIAGNOSIS — I5030 Unspecified diastolic (congestive) heart failure: Secondary | ICD-10-CM | POA: Diagnosis not present

## 2016-12-19 DIAGNOSIS — R61 Generalized hyperhidrosis: Secondary | ICD-10-CM | POA: Diagnosis not present

## 2016-12-19 DIAGNOSIS — I252 Old myocardial infarction: Secondary | ICD-10-CM | POA: Diagnosis not present

## 2016-12-19 DIAGNOSIS — R072 Precordial pain: Secondary | ICD-10-CM | POA: Diagnosis not present

## 2016-12-19 DIAGNOSIS — Z952 Presence of prosthetic heart valve: Secondary | ICD-10-CM | POA: Diagnosis not present

## 2016-12-19 DIAGNOSIS — Z6824 Body mass index (BMI) 24.0-24.9, adult: Secondary | ICD-10-CM | POA: Diagnosis not present

## 2016-12-19 DIAGNOSIS — Z955 Presence of coronary angioplasty implant and graft: Secondary | ICD-10-CM | POA: Diagnosis not present

## 2016-12-19 DIAGNOSIS — Z950 Presence of cardiac pacemaker: Secondary | ICD-10-CM | POA: Diagnosis not present

## 2016-12-19 DIAGNOSIS — N189 Chronic kidney disease, unspecified: Secondary | ICD-10-CM | POA: Diagnosis not present

## 2016-12-19 DIAGNOSIS — Z8249 Family history of ischemic heart disease and other diseases of the circulatory system: Secondary | ICD-10-CM | POA: Diagnosis not present

## 2016-12-19 DIAGNOSIS — R112 Nausea with vomiting, unspecified: Secondary | ICD-10-CM | POA: Diagnosis not present

## 2016-12-19 DIAGNOSIS — E785 Hyperlipidemia, unspecified: Secondary | ICD-10-CM | POA: Diagnosis not present

## 2016-12-19 DIAGNOSIS — E782 Mixed hyperlipidemia: Secondary | ICD-10-CM | POA: Diagnosis not present

## 2016-12-19 DIAGNOSIS — K219 Gastro-esophageal reflux disease without esophagitis: Secondary | ICD-10-CM | POA: Diagnosis not present

## 2016-12-19 DIAGNOSIS — Z87891 Personal history of nicotine dependence: Secondary | ICD-10-CM | POA: Diagnosis not present

## 2016-12-19 DIAGNOSIS — Z79899 Other long term (current) drug therapy: Secondary | ICD-10-CM | POA: Diagnosis not present

## 2016-12-19 DIAGNOSIS — I110 Hypertensive heart disease with heart failure: Secondary | ICD-10-CM | POA: Diagnosis not present

## 2016-12-19 DIAGNOSIS — I2510 Atherosclerotic heart disease of native coronary artery without angina pectoris: Secondary | ICD-10-CM | POA: Diagnosis not present

## 2016-12-19 DIAGNOSIS — E039 Hypothyroidism, unspecified: Secondary | ICD-10-CM | POA: Diagnosis not present

## 2016-12-19 DIAGNOSIS — I130 Hypertensive heart and chronic kidney disease with heart failure and stage 1 through stage 4 chronic kidney disease, or unspecified chronic kidney disease: Secondary | ICD-10-CM | POA: Diagnosis not present

## 2016-12-19 DIAGNOSIS — Z7982 Long term (current) use of aspirin: Secondary | ICD-10-CM | POA: Diagnosis not present

## 2016-12-19 DIAGNOSIS — R079 Chest pain, unspecified: Secondary | ICD-10-CM | POA: Diagnosis not present

## 2016-12-19 DIAGNOSIS — J9811 Atelectasis: Secondary | ICD-10-CM | POA: Diagnosis not present

## 2016-12-19 DIAGNOSIS — R531 Weakness: Secondary | ICD-10-CM | POA: Diagnosis not present

## 2016-12-19 DIAGNOSIS — I5032 Chronic diastolic (congestive) heart failure: Secondary | ICD-10-CM | POA: Diagnosis not present

## 2016-12-20 DIAGNOSIS — I110 Hypertensive heart disease with heart failure: Secondary | ICD-10-CM | POA: Insufficient documentation

## 2016-12-20 DIAGNOSIS — Z6824 Body mass index (BMI) 24.0-24.9, adult: Secondary | ICD-10-CM | POA: Diagnosis not present

## 2016-12-20 DIAGNOSIS — R531 Weakness: Secondary | ICD-10-CM | POA: Diagnosis not present

## 2016-12-20 DIAGNOSIS — I252 Old myocardial infarction: Secondary | ICD-10-CM | POA: Diagnosis not present

## 2016-12-20 DIAGNOSIS — E784 Other hyperlipidemia: Secondary | ICD-10-CM | POA: Diagnosis not present

## 2016-12-20 DIAGNOSIS — R0789 Other chest pain: Secondary | ICD-10-CM | POA: Diagnosis not present

## 2016-12-20 DIAGNOSIS — Z8249 Family history of ischemic heart disease and other diseases of the circulatory system: Secondary | ICD-10-CM | POA: Diagnosis not present

## 2016-12-20 DIAGNOSIS — R112 Nausea with vomiting, unspecified: Secondary | ICD-10-CM | POA: Diagnosis not present

## 2016-12-20 DIAGNOSIS — I2510 Atherosclerotic heart disease of native coronary artery without angina pectoris: Secondary | ICD-10-CM

## 2016-12-20 DIAGNOSIS — R61 Generalized hyperhidrosis: Secondary | ICD-10-CM | POA: Diagnosis not present

## 2016-12-20 DIAGNOSIS — E785 Hyperlipidemia, unspecified: Secondary | ICD-10-CM | POA: Diagnosis not present

## 2016-12-20 DIAGNOSIS — Z950 Presence of cardiac pacemaker: Secondary | ICD-10-CM | POA: Diagnosis not present

## 2016-12-20 DIAGNOSIS — I959 Hypotension, unspecified: Secondary | ICD-10-CM | POA: Diagnosis not present

## 2016-12-20 DIAGNOSIS — E039 Hypothyroidism, unspecified: Secondary | ICD-10-CM

## 2016-12-20 DIAGNOSIS — Z87891 Personal history of nicotine dependence: Secondary | ICD-10-CM | POA: Diagnosis not present

## 2016-12-20 DIAGNOSIS — I5042 Chronic combined systolic (congestive) and diastolic (congestive) heart failure: Secondary | ICD-10-CM | POA: Diagnosis not present

## 2016-12-20 DIAGNOSIS — Z7902 Long term (current) use of antithrombotics/antiplatelets: Secondary | ICD-10-CM | POA: Diagnosis not present

## 2016-12-20 DIAGNOSIS — I10 Essential (primary) hypertension: Secondary | ICD-10-CM

## 2016-12-20 DIAGNOSIS — Z7982 Long term (current) use of aspirin: Secondary | ICD-10-CM | POA: Diagnosis not present

## 2016-12-20 DIAGNOSIS — R55 Syncope and collapse: Secondary | ICD-10-CM

## 2016-12-20 DIAGNOSIS — N400 Enlarged prostate without lower urinary tract symptoms: Secondary | ICD-10-CM | POA: Diagnosis not present

## 2016-12-20 DIAGNOSIS — F17211 Nicotine dependence, cigarettes, in remission: Secondary | ICD-10-CM | POA: Diagnosis not present

## 2016-12-20 DIAGNOSIS — E782 Mixed hyperlipidemia: Secondary | ICD-10-CM | POA: Insufficient documentation

## 2016-12-20 DIAGNOSIS — Z955 Presence of coronary angioplasty implant and graft: Secondary | ICD-10-CM | POA: Diagnosis not present

## 2016-12-20 DIAGNOSIS — K219 Gastro-esophageal reflux disease without esophagitis: Secondary | ICD-10-CM | POA: Diagnosis not present

## 2016-12-20 DIAGNOSIS — M109 Gout, unspecified: Secondary | ICD-10-CM | POA: Diagnosis not present

## 2016-12-20 DIAGNOSIS — R072 Precordial pain: Secondary | ICD-10-CM | POA: Diagnosis not present

## 2016-12-20 DIAGNOSIS — R0602 Shortness of breath: Secondary | ICD-10-CM | POA: Diagnosis not present

## 2016-12-20 DIAGNOSIS — I119 Hypertensive heart disease without heart failure: Secondary | ICD-10-CM | POA: Diagnosis not present

## 2016-12-20 DIAGNOSIS — Z79899 Other long term (current) drug therapy: Secondary | ICD-10-CM | POA: Diagnosis not present

## 2016-12-20 HISTORY — DX: Syncope and collapse: R55

## 2016-12-20 HISTORY — DX: Mixed hyperlipidemia: E78.2

## 2016-12-20 HISTORY — DX: Hypertensive heart disease with heart failure: I11.0

## 2016-12-20 HISTORY — DX: Atherosclerotic heart disease of native coronary artery without angina pectoris: I25.10

## 2016-12-20 HISTORY — DX: Essential (primary) hypertension: I10

## 2016-12-20 HISTORY — DX: Hypothyroidism, unspecified: E03.9

## 2016-12-21 DIAGNOSIS — I2510 Atherosclerotic heart disease of native coronary artery without angina pectoris: Secondary | ICD-10-CM | POA: Diagnosis not present

## 2016-12-21 DIAGNOSIS — I110 Hypertensive heart disease with heart failure: Secondary | ICD-10-CM | POA: Diagnosis not present

## 2016-12-21 DIAGNOSIS — F17211 Nicotine dependence, cigarettes, in remission: Secondary | ICD-10-CM | POA: Diagnosis not present

## 2016-12-21 DIAGNOSIS — I5042 Chronic combined systolic (congestive) and diastolic (congestive) heart failure: Secondary | ICD-10-CM | POA: Diagnosis not present

## 2016-12-21 DIAGNOSIS — Z6824 Body mass index (BMI) 24.0-24.9, adult: Secondary | ICD-10-CM | POA: Diagnosis not present

## 2016-12-21 DIAGNOSIS — Z950 Presence of cardiac pacemaker: Secondary | ICD-10-CM | POA: Diagnosis not present

## 2016-12-21 DIAGNOSIS — R55 Syncope and collapse: Secondary | ICD-10-CM | POA: Diagnosis not present

## 2016-12-21 DIAGNOSIS — Z955 Presence of coronary angioplasty implant and graft: Secondary | ICD-10-CM | POA: Diagnosis not present

## 2016-12-21 DIAGNOSIS — Z8249 Family history of ischemic heart disease and other diseases of the circulatory system: Secondary | ICD-10-CM | POA: Diagnosis not present

## 2016-12-21 DIAGNOSIS — R110 Nausea: Secondary | ICD-10-CM | POA: Diagnosis not present

## 2016-12-22 DIAGNOSIS — R55 Syncope and collapse: Secondary | ICD-10-CM | POA: Diagnosis not present

## 2016-12-22 DIAGNOSIS — Z955 Presence of coronary angioplasty implant and graft: Secondary | ICD-10-CM | POA: Diagnosis not present

## 2016-12-22 DIAGNOSIS — I110 Hypertensive heart disease with heart failure: Secondary | ICD-10-CM | POA: Diagnosis not present

## 2016-12-22 DIAGNOSIS — Z950 Presence of cardiac pacemaker: Secondary | ICD-10-CM | POA: Diagnosis not present

## 2016-12-22 DIAGNOSIS — I5042 Chronic combined systolic (congestive) and diastolic (congestive) heart failure: Secondary | ICD-10-CM | POA: Diagnosis not present

## 2016-12-22 DIAGNOSIS — Z6824 Body mass index (BMI) 24.0-24.9, adult: Secondary | ICD-10-CM | POA: Diagnosis not present

## 2016-12-22 DIAGNOSIS — I2510 Atherosclerotic heart disease of native coronary artery without angina pectoris: Secondary | ICD-10-CM | POA: Diagnosis not present

## 2017-01-14 DIAGNOSIS — E785 Hyperlipidemia, unspecified: Secondary | ICD-10-CM

## 2017-01-14 HISTORY — DX: Hyperlipidemia, unspecified: E78.5

## 2017-01-20 ENCOUNTER — Ambulatory Visit: Payer: Medicare Other | Admitting: Cardiology

## 2017-01-22 ENCOUNTER — Other Ambulatory Visit: Payer: Self-pay

## 2017-01-25 ENCOUNTER — Telehealth: Payer: Self-pay | Admitting: Cardiology

## 2017-01-25 ENCOUNTER — Other Ambulatory Visit: Payer: Self-pay | Admitting: Cardiology

## 2017-01-25 ENCOUNTER — Other Ambulatory Visit: Payer: Self-pay

## 2017-01-25 MED ORDER — NEBIVOLOL HCL 5 MG PO TABS: 2.5000 mg | ORAL_TABLET | Freq: Every day | ORAL | 0 refills | Status: DC

## 2017-01-25 NOTE — Telephone Encounter (Signed)
Confirmed with Sherrie, 5 mg tablet ordered, but beaks in half for 2.5 mg dose. Refill sent.

## 2017-01-25 NOTE — Telephone Encounter (Signed)
Wants bystalic 5mg  called [facility]

## 2017-01-27 DIAGNOSIS — L210 Seborrhea capitis: Secondary | ICD-10-CM | POA: Diagnosis not present

## 2017-02-12 ENCOUNTER — Ambulatory Visit (INDEPENDENT_AMBULATORY_CARE_PROVIDER_SITE_OTHER): Payer: Medicare Other | Admitting: Cardiology

## 2017-02-12 ENCOUNTER — Encounter: Payer: Self-pay | Admitting: Cardiology

## 2017-02-12 VITALS — BP 116/68 | HR 73 | Ht 66.0 in | Wt 158.1 lb

## 2017-02-12 DIAGNOSIS — E785 Hyperlipidemia, unspecified: Secondary | ICD-10-CM

## 2017-02-12 DIAGNOSIS — I25118 Atherosclerotic heart disease of native coronary artery with other forms of angina pectoris: Secondary | ICD-10-CM | POA: Diagnosis not present

## 2017-02-12 DIAGNOSIS — I5042 Chronic combined systolic (congestive) and diastolic (congestive) heart failure: Secondary | ICD-10-CM

## 2017-02-12 DIAGNOSIS — I10 Essential (primary) hypertension: Secondary | ICD-10-CM | POA: Diagnosis not present

## 2017-02-12 MED ORDER — FUROSEMIDE 40 MG PO TABS: 40.0000 mg | ORAL_TABLET | Freq: Every day | ORAL | 3 refills | Status: DC | PRN

## 2017-02-12 MED ORDER — AMLODIPINE BESYLATE 10 MG PO TABS: 5.0000 mg | ORAL_TABLET | Freq: Every day | ORAL | 6 refills | Status: DC

## 2017-02-12 NOTE — Patient Instructions (Signed)
Medication Instructions:  Your physician recommends that you continue on your current medications as directed. Please refer to the Current Medication list given to you today.   Labwork: Your physician recommends that you return for lab work in: today. CMP, lipid   Testing/Procedures: None  Follow-Up: Your physician recommends that you schedule a follow-up appointment in: 3 months.  You will receive a phone call to schedule with Dr. Camnitz-electrophysiology.   Any Other Special Instructions Will Be Listed Below (If Applicable).     If you need a refill on your cardiac medications before your next appointment, please call your pharmacy.

## 2017-02-12 NOTE — Progress Notes (Signed)
Cardiology Office Note:    Date:  02/12/2017   ID:  Anthony Jensen., DOB [birth year], MRN [id number]  PCP: DR Lin Landsman  Cardiologist:  Shirlee More, MD     ASSESSMENT:    1. Essential hypertension   2. Chronic combined systolic and diastolic heart failure ([place])   3. Coronary artery disease of native artery of native heart with stable angina pectoris ([place])   4. Hyperlipidemia, unspecified hyperlipidemia type    PLAN:    In order of problems listed above:  1. Poorly controlled with symptomatic hypotension, he will take his furosemide only as needed and I have given his caregiver instructions were holding his calcium channel blocker to avoid symptomatic hypotension. 2. Stable compensated presently does not require a diuretic and his ejection fraction is normalized. 3. Stable continue current medical treatment with dual antiplatelet beta blocker calcium channel blocker 4. Stable check lipid profile   Next appointment: 1 yrs   Medication Adjustments/Labs and Tests Ordered: Current medicines are reviewed at length with the patient today.  Concerns regarding medicines are outlined above.  Orders Placed This Encounter  Procedures  . Comprehensive Metabolic Panel (CMET)  . Lipid Profile   Meds ordered this encounter  Medications  . furosemide (LASIX) 40 MG tablet    Sig: Take 1 tablet (40 mg total) by mouth daily as needed. For systolic > 846 or edema    Dispense:  30 tablet    Refill:  3  . amLODipine (NORVASC) 10 MG tablet    Sig: Take 0.5 tablets (5 mg total) by mouth daily. Do not take if systolic BP , 962    Dispense:  30 tablet    Refill:  6    Chief Complaint  Patient presents with  . Follow-up    Routine flup appt   . Coronary Artery Disease    History of Present Illness:    Anthony Jensen. is a 83 y.o. male with a hx of STEMI, CAD with muntiple PCi's, Dyslipidemia, HTN, S/P PCI, pacemaker for AV block and CKD last seen in April 2018.He was  hospitalized in June at [facility] with near syncope and symptomatic hypotension.His caregiver is present his blood pressure runs at less than 952 systolic and she will take responsibility for supervising his medications. He has had no further syncope chest pain dyspnea palpitation or TIA. He chooses to have his pacemaker care transition to my practice Compliance with diet, lifestyle and medications: Yes Past Medical History:  Diagnosis Date  . Acquired hypothyroidism 12/20/2016  . ANEMIA 02/21/2010   Qualifier: Diagnosis of  By: Nelson-Smith CMA (AAMA), Dottie    . Benign nodular prostatic hyperplasia 11/21/2014  . Carotid bruit 09/29/2011  . Chronic combined systolic and diastolic heart failure ([place]) 05/21/2015  . Chronic renal insufficiency 09/28/2011  . Coronary artery disease 09/28/2011   Overview:  a.  Exertional chest discomfort/arm pain with dyspnea.    b.  Bare Metal Stent to proximal left circumflex, (09/1998).    c.  Cypher Stent to mid LAD, (06/03/2005).  d.  MRI, (06/10/2010):  demonstrating normal LVEF with evidence of mild--moderate infarct of inferolateral subendocardial region with peri-infarct ischemia in the left circumflex distribution.    e.  Status post cardiac catheterization (06/23/2010) with drug-eluting stent placed at 60-80% lesion in mid-RCA.    f.  Status post cardiac cath (11/14/2010) to evaluate recurrent chest pain/arm pain and external Adenosine stress nuclear positive for septal ischemia.  Catheterization demonstrated patent stents with no  change in coronary anatomy from previous study of 06/23/2010.    . Essential hypertension 12/20/2016  . Gout 09/28/2011  . History of alcohol abuse 09/28/2011  . History of nephrolithiasis 09/28/2011  . History of rheumatic fever 09/28/2011  . History of tobacco abuse 09/28/2011  . Hyperlipidemia 01/14/2017  . Hypertension 09/28/2011  . Mixed dyslipidemia 12/20/2016  . Multinodular thyroid 09/28/2011  . Near syncope 12/20/2016  . Pacemaker  reprogramming/check 05/21/2015   Overview:  Dual chamber 12/24/14  . Peptic ulcer disease 09/28/2011  . Presence of stent in coronary artery in patient with coronary artery disease 12/20/2016  . Prostate nodule 11/21/2014  . Sinus node dysfunction ([place]) 12/24/2014    Past Surgical History:  Procedure Laterality Date  . CORONARY ANGIOPLASTY    . PACEMAKER IMPLANT    . PENILE PROSTHESIS IMPLANT    . THYROID SURGERY      Current Medications: Current Meds  Medication Sig  . amLODipine (NORVASC) 10 MG tablet Take 0.5 tablets (5 mg total) by mouth daily. Do not take if systolic BP , 161  . aspirin 81 MG tablet Take 81 mg by mouth daily.  . clopidogrel (PLAVIX) 75 MG tablet Take 75 mg by mouth daily.  . furosemide (LASIX) 40 MG tablet Take 1 tablet (40 mg total) by mouth daily as needed. For systolic > 096 or edema  . levothyroxine (SYNTHROID, LEVOTHROID) 88 MCG tablet Take 88 mcg by mouth daily.  . nebivolol (BYSTOLIC) 5 MG tablet Take 0.5 tablets (2.5 mg total) by mouth daily.  . potassium chloride SA (K-DUR,KLOR-CON) 20 MEQ tablet TAKE 1/2 TABLET BY MOUTH DAILY  . tamsulosin (FLOMAX) 0.4 MG CAPS capsule Take 0.4 mg by mouth daily.  . [DISCONTINUED] amLODipine (NORVASC) 10 MG tablet Take 5 mg by mouth daily. Do not take if systolic BP , 045  . [DISCONTINUED] furosemide (LASIX) 40 MG tablet Take 40 mg by mouth daily.     Allergies:   Lorazepam and Crestor [rosuvastatin calcium]   Social History   Social History  . Marital status: Widowed    Spouse name: N/A  . Number of children: N/A  . Years of education: N/A   Social History Main Topics  . Smoking status: Former Smoker    Types: Cigarettes  . Smokeless tobacco: Never Used  . Alcohol use No  . Drug use: No  . Sexual activity: Not Asked   Other Topics Concern  . None   Social History Narrative  . None     Family History: The patient's family history includes CAD in his father. ROS:   Please see the history of present  illness.    All other systems reviewed and are negative.  EKGs/Labs/Other Studies Reviewed:    The following studies were reviewed today:  Cardiac cat 04/21/16: Conclusions Diagnostic Procedure Summary 1. Small distal LAD/PDA lesions. Prior stents are widely patent 2. Normal LV function, LVEDP 11 Diagnostic Procedure Recommendations Medical therapy for CAD.  REMOTE MONITORING ASSESSMENT  Date of Transmission: Nov 05, 2016 Patient MR Number: [id number] Patient Name: Drelyn Pistilli [facility] May 04, 1926, 83 y.o. Manufacturer of Device: Medtronic Type of Device: Dual Chamber Pacemaker Presenting Rhythm: AP VP @ 80bpm Percentage RV Pacing: 58% RV Paced Device Findings:  Please see downloaded PDF file of transmission under Media Tab for full details of device interrogation to include, when applicable, battery status/charge time, lead trend data, and programmed parameters.  No Significant Atrial or Ventricular Arrhythmias  Battery Status Adequate Battery Voltage  Lead  Trends Lead Trends Stable  Recent Labs: 12/22/16 CMP normal except cr 1.33, troponin negative hgb 11.2 No results found for requested labs within last 8760 hours.  Recent Lipid Panel 11/17/16 Chol 180, HDL 58 LDL 99 No results found for: CHOL, TRIG, HDL, CHOLHDL, VLDL, LDLCALC, LDLDIRECT  Physical Exam:    VS:  BP 116/68 (BP Location: Right Arm, Patient Position: Sitting)   Pulse 73   Ht 5\' 6"  (1.676 m)   Wt 158 lb 1.9 oz (71.7 kg)   SpO2 97%   BMI 25.52 kg/m     Wt Readings from Last 3 Encounters:  02/12/17 158 lb 1.9 oz (71.7 kg)  09/18/11 155 lb 4.8 oz (70.4 kg)     GEN:  Well nourished, well developed in no acute distress HEENT: Normal NECK: No JVD; No carotid bruits LYMPHATICS: No lymphadenopathy CARDIAC: RRR, no murmurs, rubs, gallops RESPIRATORY:  Clear to auscultation without rales, wheezing or rhonchi  ABDOMEN: Soft, non-tender, non-distended MUSCULOSKELETAL:  No edema; No deformity  SKIN:  Warm and dry NEUROLOGIC:  Alert and oriented x 3 PSYCHIATRIC:  Normal affect    Signed, Shirlee More, MD  02/12/2017 12:48 PM    [facility]

## 2017-02-13 LAB — COMPREHENSIVE METABOLIC PANEL
A/G RATIO: 1.7 (ref 1.2–2.2)
ALT: 8 IU/L (ref 0–44)
AST: 20 IU/L (ref 0–40)
Albumin: 4 g/dL (ref 3.5–4.7)
Alkaline Phosphatase: 112 IU/L (ref 39–117)
BILIRUBIN TOTAL: 0.3 mg/dL (ref 0.0–1.2)
BUN / CREAT RATIO: 16 (ref 10–24)
BUN: 27 mg/dL (ref 8–27)
CHLORIDE: 99 mmol/L (ref 96–106)
CO2: 25 mmol/L (ref 20–29)
Calcium: 9.5 mg/dL (ref 8.6–10.2)
Creatinine, Ser: 1.74 mg/dL — ABNORMAL HIGH (ref 0.76–1.27)
GFR calc non Af Amer: 34 mL/min/{1.73_m2} — ABNORMAL LOW (ref >59)
GFR, EST AFRICAN AMERICAN: 39 mL/min/{1.73_m2} — AB (ref >59)
Globulin, Total: 2.4 g/dL (ref 1.5–4.5)
Glucose: 95 mg/dL (ref 65–99)
POTASSIUM: 4.1 mmol/L (ref 3.5–5.2)
Sodium: 141 mmol/L (ref 134–144)
TOTAL PROTEIN: 6.4 g/dL (ref 6.0–8.5)

## 2017-02-13 LAB — LIPID PANEL
Chol/HDL Ratio: 3.2 ratio (ref 0.0–5.0)
Cholesterol, Total: 174 mg/dL (ref 100–199)
HDL: 55 mg/dL (ref >39)
LDL Calculated: 98 mg/dL (ref 0–99)
Triglycerides: 104 mg/dL (ref 0–149)
VLDL Cholesterol Cal: 21 mg/dL (ref 5–40)

## 2017-03-26 DIAGNOSIS — H6121 Impacted cerumen, right ear: Secondary | ICD-10-CM | POA: Diagnosis not present

## 2017-04-09 ENCOUNTER — Other Ambulatory Visit: Payer: Self-pay | Admitting: Cardiology

## 2017-05-05 ENCOUNTER — Other Ambulatory Visit: Payer: Self-pay | Admitting: Cardiology

## 2017-05-12 ENCOUNTER — Encounter: Payer: Self-pay | Admitting: Cardiology

## 2017-05-12 ENCOUNTER — Ambulatory Visit (INDEPENDENT_AMBULATORY_CARE_PROVIDER_SITE_OTHER): Payer: Medicare Other | Admitting: Cardiology

## 2017-05-12 VITALS — BP 158/76 | HR 77 | Ht 67.0 in | Wt 160.1 lb

## 2017-05-12 DIAGNOSIS — I10 Essential (primary) hypertension: Secondary | ICD-10-CM | POA: Diagnosis not present

## 2017-05-12 DIAGNOSIS — I495 Sick sinus syndrome: Secondary | ICD-10-CM

## 2017-05-12 DIAGNOSIS — I25118 Atherosclerotic heart disease of native coronary artery with other forms of angina pectoris: Secondary | ICD-10-CM

## 2017-05-12 NOTE — Patient Instructions (Signed)
Medication Instructions:  Your physician recommends that you continue on your current medications as directed. Please refer to the Current Medication list given to you today.  *If you need a refill on your cardiac medications before your next appointment, please call your pharmacy*  Labwork: None ordered  Testing/Procedures: None ordered  Follow-Up: Remote monitoring is used to monitor your Pacemaker or ICD from home. This monitoring reduces the number of office visits required to check your device to one time per year. It allows [place] to keep an eye on the functioning of your device to ensure it is working properly. You are scheduled for a device check from home on 08/11/2017. You may send your transmission at any time that day. If you have a wireless device, the transmission will be sent automatically. After your physician reviews your transmission, you will receive a postcard with your next transmission date.  Your physician wants you to follow-up in: 1 year with Dr. Curt Bears.  You will receive a reminder letter in the mail two months in advance. If you don't receive a letter, please call our office to schedule the follow-up appointment.  Thank you for choosing [facility]!!   Trinidad Curet, RN [phone]  Any Other Special Instructions Will Be Listed Below (If Applicable).

## 2017-05-12 NOTE — Progress Notes (Signed)
Electrophysiology Office Note   Date:  05/12/2017   ID:  Anthony Pew [name], DOB [birth year], MRN [id number]  PCP:  [name], MD  Cardiologist:  Bettina Gavia Primary Electrophysiologist: Will [name], MD    Chief Complaint  Patient presents with  . Pacemaker Check    AV block     History of Present Illness: Anthony Jensen. is a 83 y.o. male who is being seen today for the evaluation of CHF at the request of [name], MD. Presenting today for electrophysiology evaluation.  He has a history of essential hypertension, chronic combined systolic and diastolic heart failure, coronary artery disease with stable angina, and hyperlipidemia.    Today, he denies symptoms of palpitations, chest pain, shortness of breath, orthopnea, PND, lower extremity edema, claudication, dizziness, presyncope, syncope, bleeding, or neurologic sequela. The patient is tolerating medications without difficulties.    Past Medical History:  Diagnosis Date  . Acquired hypothyroidism 12/20/2016  . ANEMIA 02/21/2010   Qualifier: Diagnosis of  By: Nelson-Smith CMA (AAMA), Dottie    . Benign nodular prostatic hyperplasia 11/21/2014  . Carotid bruit 09/29/2011  . Chronic combined systolic and diastolic heart failure ([place]) 05/21/2015  . Chronic renal insufficiency 09/28/2011  . Coronary artery disease 09/28/2011   Overview:  a.  Exertional chest discomfort/arm pain with dyspnea.    b.  Bare Metal Stent to proximal left circumflex, (09/1998).    c.  Cypher Stent to mid LAD, (06/03/2005).  d.  MRI, (06/10/2010):  demonstrating normal LVEF with evidence of mild--moderate infarct of inferolateral subendocardial region with peri-infarct ischemia in the left circumflex distribution.    e.  Status post cardiac catheterization (06/23/2010) with drug-eluting stent placed at 60-80% lesion in mid-RCA.    f.  Status post cardiac cath (11/14/2010) to evaluate recurrent chest pain/arm pain and  external Adenosine stress nuclear positive for septal ischemia.  Catheterization demonstrated patent stents with no change in coronary anatomy from previous study of 06/23/2010.    . Essential hypertension 12/20/2016  . Gout 09/28/2011  . History of alcohol abuse 09/28/2011  . History of nephrolithiasis 09/28/2011  . History of rheumatic fever 09/28/2011  . History of tobacco abuse 09/28/2011  . Hyperlipidemia 01/14/2017  . Hypertension 09/28/2011  . Mixed dyslipidemia 12/20/2016  . Multinodular thyroid 09/28/2011  . Near syncope 12/20/2016  . Pacemaker reprogramming/check 05/21/2015   Overview:  Dual chamber 12/24/14  . Peptic ulcer disease 09/28/2011  . Presence of stent in coronary artery in patient with coronary artery disease 12/20/2016  . Prostate nodule 11/21/2014  . Sinus node dysfunction ([place]) 12/24/2014   Past Surgical History:  Procedure Laterality Date  . CORONARY ANGIOPLASTY    . PACEMAKER IMPLANT    . PENILE PROSTHESIS IMPLANT    . THYROID SURGERY       Current Outpatient Medications  Medication Sig Dispense Refill  . amLODipine (NORVASC) 10 MG tablet Take 0.5 tablets (5 mg total) by mouth daily. Do not take if systolic BP , 295 30 tablet 6  . aspirin 81 MG tablet Take 81 mg by mouth daily.    Marland Kitchen BYSTOLIC 5 MG tablet TAKE 1/2 TABLET BY MOUTH DAILY 15 tablet 11  . clopidogrel (PLAVIX) 75 MG tablet TAKE ONE (1) TABLET BY MOUTH ONCE DAILY 90 tablet 3  . furosemide (LASIX) 40 MG tablet Take 1 tablet (40 mg total) by mouth daily as needed. For systolic > 188 or edema 30 tablet 3  . levothyroxine (  SYNTHROID, LEVOTHROID) 88 MCG tablet Take 88 mcg by mouth daily.    . potassium chloride SA (K-DUR,KLOR-CON) 20 MEQ tablet TAKE 1/2 TABLET BY MOUTH DAILY 90 tablet 0  . tamsulosin (FLOMAX) 0.4 MG CAPS capsule Take 0.4 mg by mouth daily.     No current facility-administered medications for this visit.     Allergies:   Lorazepam and Crestor [rosuvastatin calcium]   Social History:  The  patient  reports that he has quit smoking. His smoking use included cigarettes. he has never used smokeless tobacco. He reports that he does not drink alcohol or use drugs.   Family History:  The patient's family history includes CAD in his father.    ROS:  Please see the history of present illness.   Otherwise, review of systems is positive for none.   All other systems are reviewed and negative.    PHYSICAL EXAM: VS:  BP (!) 158/76   Pulse 77   Ht 5\' 7"  (1.702 m)   Wt 160 lb 1.9 oz (72.6 kg)   BMI 25.08 kg/m  , BMI Body mass index is 25.08 kg/m. GEN: Well nourished, well developed, in no acute distress  HEENT: normal  Neck: no JVD, carotid bruits, or masses Cardiac: RRR; no murmurs, rubs, or gallops,no edema  Respiratory:  clear to auscultation bilaterally, normal work of breathing GI: soft, nontender, nondistended, + BS MS: no deformity or atrophy  Skin: warm and dry, device pocket is well healed Neuro:  Strength and sensation are intact Psych: euthymic mood, full affect  EKG:  EKG is ordered today. Personal review of the ekg ordered shows AV paced  Device interrogation is reviewed today in detail.  See PaceArt for details.   Recent Labs: 02/12/2017: ALT 8; BUN 27; Creatinine, Ser 1.74; Potassium 4.1; Sodium 141    Lipid Panel     Component Value Date/Time   CHOL 174 02/12/2017 1003   TRIG 104 02/12/2017 1003   HDL 55 02/12/2017 1003   CHOLHDL 3.2 02/12/2017 1003   LDLCALC 98 02/12/2017 1003     Wt Readings from Last 3 Encounters:  05/12/17 160 lb 1.9 oz (72.6 kg)  02/12/17 158 lb 1.9 oz (71.7 kg)  09/18/11 155 lb 4.8 oz (70.4 kg)      Other studies Reviewed: Additional studies/ records that were reviewed today include: Cardiac cath 04/2016  Review of the above records today demonstrates:  LMCA: 0%. LAD: Lesion on Dist LAD: 60% stenosis. LCx: Lesion on Mid CX: 30% stenosis. RCA: Lesion on R PDA: 60% stenosis.   ASSESSMENT AND PLAN:  1.   Sinus node dysfunction: Status post Medtronic dual-chamber pacemaker.  Face is functioning appropriately.  100% a paced 60% V paced.  No changes.  2.  Hypertension: Blood pressure elevated today.  Has been normal on past checks.  We will continue monitoring without medication changes.  3.  Coronary artery disease with stable angina: Continue treatment with Bystolic, aspirin, and Plavix.  Current medicines are reviewed at length with the patient today.   The patient does not have concerns regarding his medicines.  The following changes were made today:  none  Labs/ tests ordered today include:  No orders of the defined types were placed in this encounter.    Disposition:   FU with Will Camnitz 1 year  Signed, Will [name], MD  05/12/2017 3:17 PM     [address] [place] [address] [phone] (office) [phone] (fax)

## 2017-05-17 ENCOUNTER — Encounter: Payer: Self-pay | Admitting: Cardiology

## 2017-05-17 ENCOUNTER — Ambulatory Visit (INDEPENDENT_AMBULATORY_CARE_PROVIDER_SITE_OTHER): Payer: Medicare Other | Admitting: Cardiology

## 2017-05-17 VITALS — BP 132/68 | HR 75 | Ht 67.0 in | Wt 157.0 lb

## 2017-05-17 DIAGNOSIS — I10 Essential (primary) hypertension: Secondary | ICD-10-CM | POA: Diagnosis not present

## 2017-05-17 DIAGNOSIS — E785 Hyperlipidemia, unspecified: Secondary | ICD-10-CM

## 2017-05-17 DIAGNOSIS — I25118 Atherosclerotic heart disease of native coronary artery with other forms of angina pectoris: Secondary | ICD-10-CM | POA: Diagnosis not present

## 2017-05-17 DIAGNOSIS — I5042 Chronic combined systolic (congestive) and diastolic (congestive) heart failure: Secondary | ICD-10-CM

## 2017-05-17 NOTE — Progress Notes (Signed)
Cardiology Office Note:    Date:  05/17/2017   ID:  Anthony Jensen., DOB January 01, 1926, MRN [id number]  PCP:  [name], MD  Cardiologist:  Shirlee More, MD    Referring MD: [name], MD    ASSESSMENT:    1. Chronic combined systolic and diastolic heart failure ([place])   2. Coronary artery disease of native artery of native heart with stable angina pectoris ([place])   3. Essential hypertension   4. Hyperlipidemia, unspecified hyperlipidemia type    PLAN:    In order of problems listed above:  1. Stable, continue his current dose of loop diuretic and sodium restriction.  He is on a beta-blocker and long-acting calcium channel blocker and does not take ACE or arm with previous acute deterioration in renal function. 2. Stable continue current medical therapy including long term dual antiplatelet agent beta-blocker. 3. Stable continue current therapy, his calcium channel blocker is withheld if systolic blood pressure is low 4. He had stopped his statin due to myalgias we will check a lipid profile and likely restart him on a low-dose of a low intensity statin afterwards   Next appointment: 3 months   Medication Adjustments/Labs and Tests Ordered: Current medicines are reviewed at length with the patient today.  Concerns regarding medicines are outlined above.  Orders Placed This Encounter  Procedures  . Lipid Profile  . Comprehensive Metabolic Panel (CMET)  . Pro b natriuretic peptide (BNP)   No orders of the defined types were placed in this encounter.   Chief Complaint  Patient presents with  . Follow-up    and hypotension  . Coronary Artery Disease  . Congestive Heart Failure  . 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 Seen by EP and recent device check 05/12/17:  Status post Medtronic dual-chamber pacemaker.  Face is functioning appropriately.  100% a paced 60% V paced.  No changes.  Compliance with diet, lifestyle and medications: Yes He is doing better with titration of his blood pressure medication, his calcium channel blocker is withheld if his systolics are low.  He has had no episodes of dizziness fainting chest pain palpitation or syncope.  His heart failure is compensated no shortness of breath or edema and is compliant with sodium restriction weights daily Past Medical History:  Diagnosis Date  . Acquired hypothyroidism 12/20/2016  . ANEMIA 02/21/2010   Qualifier: Diagnosis of  By: Nelson-Smith CMA (AAMA), Dottie    . Benign nodular prostatic hyperplasia 11/21/2014  . Carotid bruit 09/29/2011  . Chronic combined systolic and diastolic heart failure ([place]) 05/21/2015  . Chronic renal insufficiency 09/28/2011  . Coronary artery disease 09/28/2011   Overview:  a.  Exertional chest discomfort/arm pain with dyspnea.    b.  Bare Metal Stent to proximal left circumflex, (09/1998).    c.  Cypher Stent to mid LAD, (06/03/2005).  d.  MRI, (06/10/2010):  demonstrating normal LVEF with evidence of mild--moderate infarct of inferolateral subendocardial region with peri-infarct ischemia in the left circumflex distribution.    e.  Status post cardiac catheterization (06/23/2010) with drug-eluting stent placed at 60-80% lesion in mid-RCA.    f.  Status post cardiac cath (11/14/2010) to evaluate recurrent chest pain/arm pain and  external Adenosine stress nuclear positive for septal ischemia.  Catheterization demonstrated patent stents with no change in coronary anatomy from previous study of 06/23/2010.    . Essential hypertension 12/20/2016  . Gout 09/28/2011  . History of alcohol abuse 09/28/2011  . History of nephrolithiasis 09/28/2011  . History of rheumatic fever 09/28/2011  . History of tobacco abuse  09/28/2011  . Hyperlipidemia 01/14/2017  . Hypertension 09/28/2011  . Mixed dyslipidemia 12/20/2016  . Multinodular thyroid 09/28/2011  . Near syncope 12/20/2016  . Pacemaker reprogramming/check 05/21/2015   Overview:  Dual chamber 12/24/14  . Peptic ulcer disease 09/28/2011  . Presence of stent in coronary artery in patient with coronary artery disease 12/20/2016  . Prostate nodule 11/21/2014  . Sinus node dysfunction ([place]) 12/24/2014    Past Surgical History:  Procedure Laterality Date  . CORONARY ANGIOPLASTY    . PACEMAKER IMPLANT    . PENILE PROSTHESIS IMPLANT    . THYROID SURGERY      Current Medications: Current Meds  Medication Sig  . amLODipine (NORVASC) 10 MG tablet Take 0.5 tablets (5 mg total) by mouth daily. Do not take if systolic BP , 951  . aspirin 81 MG tablet Take 81 mg by mouth daily.  Marland Kitchen BYSTOLIC 5 MG tablet TAKE 1/2 TABLET BY MOUTH DAILY  . clopidogrel (PLAVIX) 75 MG tablet TAKE ONE (1) TABLET BY MOUTH ONCE DAILY  . furosemide (LASIX) 40 MG tablet Take 1 tablet (40 mg total) by mouth daily as needed. For systolic > 884 or edema  . levothyroxine (SYNTHROID, LEVOTHROID) 88 MCG tablet Take 88 mcg by mouth daily.  . potassium chloride SA (K-DUR,KLOR-CON) 20 MEQ tablet TAKE 1/2 TABLET BY MOUTH DAILY  . tamsulosin (FLOMAX) 0.4 MG CAPS capsule Take 0.4 mg by mouth daily.     Allergies:   Lorazepam and Crestor [rosuvastatin calcium]   Social History   Socioeconomic History  . Marital status: Widowed    Spouse name: None  . Number of children: None  . Years of education: None  . Highest education level: None  Social Needs  . Financial resource strain: None  . Food insecurity - worry: None  . Food insecurity - inability: None  . Transportation needs - medical: None  . Transportation needs - non-medical: None  Occupational History  . None  Tobacco Use  . Smoking status: Former Smoker    Types: Cigarettes  . Smokeless tobacco: Never Used  Substance and Sexual  Activity  . Alcohol use: No  . Drug use: No  . Sexual activity: None  Other Topics Concern  . None  Social History Narrative  . None     Family History: The patient's family history includes CAD in his father. ROS:   Please see the history of present illness.    All other systems reviewed and are negative.  EKGs/Labs/Other Studies Reviewed:    The following studies were reviewed today:  Recent Labs: 02/12/2017: ALT 8; BUN 27; Creatinine, Ser 1.74; Potassium 4.1; Sodium 141  Recent Lipid Panel    Component Value Date/Time   CHOL 174 02/12/2017 1003   TRIG 104 02/12/2017 1003   HDL 55 02/12/2017 1003   CHOLHDL 3.2 02/12/2017 1003   LDLCALC 98 02/12/2017 1003    Physical Exam:    VS:  BP 132/68 (BP Location: Right Arm, Patient Position: Sitting, Cuff Size: Normal)   Pulse 75   Ht 5\' 7"  (1.702 m)   Wt 157 lb (71.2 kg)   SpO2 97%  BMI 24.59 kg/m     Wt Readings from Last 3 Encounters:  05/17/17 157 lb (71.2 kg)  05/12/17 160 lb 1.9 oz (72.6 kg)  02/12/17 158 lb 1.9 oz (71.7 kg)     GEN:  Well nourished, well developed in no acute distress HEENT: Normal NECK: No JVD; No carotid bruits LYMPHATICS: No lymphadenopathy CARDIAC: RRR, no murmurs, rubs, gallops RESPIRATORY:  Clear to auscultation without rales, wheezing or rhonchi  ABDOMEN: Soft, non-tender, non-distended MUSCULOSKELETAL:  No edema; No deformity  SKIN: Warm and dry NEUROLOGIC:  Alert and oriented x 3 PSYCHIATRIC:  Normal affect    Signed, Shirlee More, MD  05/17/2017 10:36 AM    [place]

## 2017-05-17 NOTE — Patient Instructions (Signed)
Medication Instructions:  Your physician recommends that you continue on your current medications as directed. Please refer to the Current Medication list given to you today.  Labwork: Your physician recommends that you return for lab work in: today. CMP, BNP, lipid  Testing/Procedures: None  Follow-Up: Your physician wants you to follow-up in: 3 months. You will receive a reminder letter in the mail two months in advance. If you don't receive a letter, please call our office to schedule the follow-up appointment.  Any Other Special Instructions Will Be Listed Below (If Applicable).     If you need a refill on your cardiac medications before your next appointment, please call your pharmacy.

## 2017-05-18 LAB — COMPREHENSIVE METABOLIC PANEL
A/G RATIO: 1.7 (ref 1.2–2.2)
ALK PHOS: 117 IU/L (ref 39–117)
ALT: 12 IU/L (ref 0–44)
AST: 18 IU/L (ref 0–40)
Albumin: 4.4 g/dL (ref 3.5–4.7)
BILIRUBIN TOTAL: 0.4 mg/dL (ref 0.0–1.2)
BUN/Creatinine Ratio: 16 (ref 10–24)
BUN: 25 mg/dL (ref 8–27)
CHLORIDE: 103 mmol/L (ref 96–106)
CO2: 23 mmol/L (ref 20–29)
Calcium: 9.8 mg/dL (ref 8.6–10.2)
Creatinine, Ser: 1.58 mg/dL — ABNORMAL HIGH (ref 0.76–1.27)
GFR calc non Af Amer: 38 mL/min/{1.73_m2} — ABNORMAL LOW (ref >59)
GFR, EST AFRICAN AMERICAN: 44 mL/min/{1.73_m2} — AB (ref >59)
GLUCOSE: 90 mg/dL (ref 65–99)
Globulin, Total: 2.6 g/dL (ref 1.5–4.5)
POTASSIUM: 4.5 mmol/L (ref 3.5–5.2)
Sodium: 147 mmol/L — ABNORMAL HIGH (ref 134–144)
TOTAL PROTEIN: 7 g/dL (ref 6.0–8.5)

## 2017-05-18 LAB — LIPID PANEL
CHOL/HDL RATIO: 3.5 ratio (ref 0.0–5.0)
CHOLESTEROL TOTAL: 225 mg/dL — AB (ref 100–199)
HDL: 65 mg/dL (ref >39)
LDL CALC: 139 mg/dL — AB (ref 0–99)
Triglycerides: 103 mg/dL (ref 0–149)
VLDL CHOLESTEROL CAL: 21 mg/dL (ref 5–40)

## 2017-05-18 LAB — PRO B NATRIURETIC PEPTIDE: NT-Pro BNP: 612 pg/mL — ABNORMAL HIGH (ref 0–486)

## 2017-05-19 ENCOUNTER — Telehealth: Payer: Self-pay

## 2017-05-19 MED ORDER — PRAVASTATIN SODIUM 20 MG PO TABS: 20.0000 mg | ORAL_TABLET | ORAL | 3 refills | Status: DC

## 2017-05-19 NOTE — Telephone Encounter (Signed)
Sherri, who accompanied patient to appointment and requested results be told to her, informed of results. Sherri states the patient will take pravastatin 20 mg every other day. Dr. Bettina Gavia gave verbal order for pravastatin 20 mg every other day. Pravastatin 20 mg every other day sent to [facility]. Letter of results will be mailed to patient's home address per request.

## 2017-05-19 NOTE — Telephone Encounter (Signed)
-----   Message from Richardo Priest, MD sent at 05/18/2017  8:27 AM EST ----- Normal or stable result  CMP is good  He needs a statin but stopped with muscle soreness  Try pravastatin 20 mg a day and will recheck at next office visit

## 2017-06-22 LAB — CUP PACEART INCLINIC DEVICE CHECK
Date Time Interrogation Session: 20181218124911
Implantable Lead Implant Date: 20160620
Implantable Lead Location: 753859
MDC IDC LEAD IMPLANT DT: 20160620
MDC IDC LEAD LOCATION: 753860
MDC IDC PG IMPLANT DT: 20160620

## 2017-07-14 DIAGNOSIS — R05 Cough: Secondary | ICD-10-CM | POA: Diagnosis not present

## 2017-07-14 DIAGNOSIS — J40 Bronchitis, not specified as acute or chronic: Secondary | ICD-10-CM | POA: Diagnosis not present

## 2017-07-30 DIAGNOSIS — J101 Influenza due to other identified influenza virus with other respiratory manifestations: Secondary | ICD-10-CM | POA: Diagnosis not present

## 2017-07-30 DIAGNOSIS — R05 Cough: Secondary | ICD-10-CM | POA: Diagnosis not present

## 2017-08-03 ENCOUNTER — Other Ambulatory Visit: Payer: Self-pay | Admitting: Cardiology

## 2017-08-11 ENCOUNTER — Telehealth: Payer: Self-pay | Admitting: Cardiology

## 2017-08-11 ENCOUNTER — Encounter: Payer: Medicare Other | Admitting: *Deleted

## 2017-08-11 DIAGNOSIS — Z950 Presence of cardiac pacemaker: Secondary | ICD-10-CM | POA: Diagnosis not present

## 2017-08-11 NOTE — Telephone Encounter (Signed)
Spoke with pt and reminded pt of remote transmission that is due today. Pt verbalized understanding.   

## 2017-08-12 ENCOUNTER — Encounter: Payer: Self-pay | Admitting: Cardiology

## 2017-08-18 DIAGNOSIS — L821 Other seborrheic keratosis: Secondary | ICD-10-CM | POA: Diagnosis not present

## 2017-08-18 DIAGNOSIS — L570 Actinic keratosis: Secondary | ICD-10-CM | POA: Diagnosis not present

## 2017-09-13 ENCOUNTER — Other Ambulatory Visit: Payer: Self-pay | Admitting: Cardiology

## 2017-09-13 DIAGNOSIS — I10 Essential (primary) hypertension: Secondary | ICD-10-CM

## 2017-09-13 DIAGNOSIS — I5042 Chronic combined systolic (congestive) and diastolic (congestive) heart failure: Secondary | ICD-10-CM

## 2017-09-21 ENCOUNTER — Telehealth: Payer: Self-pay | Admitting: Cardiology

## 2017-09-21 DIAGNOSIS — I10 Essential (primary) hypertension: Secondary | ICD-10-CM | POA: Diagnosis not present

## 2017-09-21 DIAGNOSIS — N181 Chronic kidney disease, stage 1: Secondary | ICD-10-CM | POA: Diagnosis not present

## 2017-09-21 DIAGNOSIS — Z87891 Personal history of nicotine dependence: Secondary | ICD-10-CM | POA: Diagnosis not present

## 2017-09-21 DIAGNOSIS — J181 Lobar pneumonia, unspecified organism: Secondary | ICD-10-CM | POA: Diagnosis not present

## 2017-09-21 DIAGNOSIS — E039 Hypothyroidism, unspecified: Secondary | ICD-10-CM | POA: Diagnosis present

## 2017-09-21 DIAGNOSIS — K219 Gastro-esophageal reflux disease without esophagitis: Secondary | ICD-10-CM | POA: Diagnosis not present

## 2017-09-21 DIAGNOSIS — Z79899 Other long term (current) drug therapy: Secondary | ICD-10-CM | POA: Diagnosis not present

## 2017-09-21 DIAGNOSIS — I129 Hypertensive chronic kidney disease with stage 1 through stage 4 chronic kidney disease, or unspecified chronic kidney disease: Secondary | ICD-10-CM | POA: Diagnosis present

## 2017-09-21 DIAGNOSIS — R05 Cough: Secondary | ICD-10-CM | POA: Diagnosis not present

## 2017-09-21 DIAGNOSIS — Z950 Presence of cardiac pacemaker: Secondary | ICD-10-CM | POA: Diagnosis not present

## 2017-09-21 DIAGNOSIS — E871 Hypo-osmolality and hyponatremia: Secondary | ICD-10-CM | POA: Diagnosis present

## 2017-09-21 DIAGNOSIS — N183 Chronic kidney disease, stage 3 (moderate): Secondary | ICD-10-CM | POA: Diagnosis present

## 2017-09-21 DIAGNOSIS — Z7982 Long term (current) use of aspirin: Secondary | ICD-10-CM | POA: Diagnosis not present

## 2017-09-21 DIAGNOSIS — R0602 Shortness of breath: Secondary | ICD-10-CM | POA: Diagnosis not present

## 2017-09-21 DIAGNOSIS — J9601 Acute respiratory failure with hypoxia: Secondary | ICD-10-CM | POA: Diagnosis present

## 2017-09-21 NOTE — Telephone Encounter (Signed)
Contacted Sherrie per [employer]. Sherrie states the patient is short of breath and has not been out of the bed for 2 days.Advised for patient to go to the nearest emergency department. Sherrie verbalized understanding, no further questions.

## 2017-09-21 NOTE — Telephone Encounter (Signed)
Patient has been really weak and not feeling well and having SOB and stomach looks tight and they are concerned that he is really sick, plesae call.

## 2017-09-21 NOTE — Telephone Encounter (Signed)
Attempted to contact patient three times on home number, line busy. Contacted mobile number and left voicemail that if patient is shortness of breath and feels that he is really sick he should go to the nearest emergency department.

## 2017-09-22 DIAGNOSIS — Z7982 Long term (current) use of aspirin: Secondary | ICD-10-CM | POA: Diagnosis not present

## 2017-09-22 DIAGNOSIS — Z79899 Other long term (current) drug therapy: Secondary | ICD-10-CM | POA: Diagnosis not present

## 2017-09-22 DIAGNOSIS — E871 Hypo-osmolality and hyponatremia: Secondary | ICD-10-CM | POA: Diagnosis not present

## 2017-09-22 DIAGNOSIS — N183 Chronic kidney disease, stage 3 (moderate): Secondary | ICD-10-CM | POA: Diagnosis not present

## 2017-09-22 DIAGNOSIS — N181 Chronic kidney disease, stage 1: Secondary | ICD-10-CM | POA: Diagnosis not present

## 2017-09-22 DIAGNOSIS — J9601 Acute respiratory failure with hypoxia: Secondary | ICD-10-CM | POA: Diagnosis not present

## 2017-09-22 DIAGNOSIS — Z950 Presence of cardiac pacemaker: Secondary | ICD-10-CM | POA: Diagnosis not present

## 2017-09-22 DIAGNOSIS — I129 Hypertensive chronic kidney disease with stage 1 through stage 4 chronic kidney disease, or unspecified chronic kidney disease: Secondary | ICD-10-CM | POA: Diagnosis not present

## 2017-09-22 DIAGNOSIS — Z87891 Personal history of nicotine dependence: Secondary | ICD-10-CM | POA: Diagnosis not present

## 2017-09-22 DIAGNOSIS — E039 Hypothyroidism, unspecified: Secondary | ICD-10-CM | POA: Diagnosis not present

## 2017-09-22 DIAGNOSIS — K219 Gastro-esophageal reflux disease without esophagitis: Secondary | ICD-10-CM | POA: Diagnosis not present

## 2017-09-22 DIAGNOSIS — J181 Lobar pneumonia, unspecified organism: Secondary | ICD-10-CM

## 2017-09-22 DIAGNOSIS — I10 Essential (primary) hypertension: Secondary | ICD-10-CM | POA: Diagnosis not present

## 2017-10-25 DIAGNOSIS — E039 Hypothyroidism, unspecified: Secondary | ICD-10-CM | POA: Diagnosis not present

## 2017-10-25 DIAGNOSIS — H6123 Impacted cerumen, bilateral: Secondary | ICD-10-CM | POA: Diagnosis not present

## 2017-10-25 DIAGNOSIS — Z6825 Body mass index (BMI) 25.0-25.9, adult: Secondary | ICD-10-CM | POA: Diagnosis not present

## 2017-10-25 DIAGNOSIS — E559 Vitamin D deficiency, unspecified: Secondary | ICD-10-CM | POA: Diagnosis not present

## 2017-10-25 DIAGNOSIS — Z79899 Other long term (current) drug therapy: Secondary | ICD-10-CM | POA: Diagnosis not present

## 2017-10-25 DIAGNOSIS — D519 Vitamin B12 deficiency anemia, unspecified: Secondary | ICD-10-CM | POA: Diagnosis not present

## 2017-10-25 DIAGNOSIS — R5383 Other fatigue: Secondary | ICD-10-CM | POA: Diagnosis not present

## 2017-12-24 ENCOUNTER — Encounter: Payer: Self-pay | Admitting: Cardiology

## 2017-12-30 ENCOUNTER — Encounter: Payer: Medicare Other | Admitting: *Deleted

## 2018-02-01 DIAGNOSIS — M545 Low back pain: Secondary | ICD-10-CM | POA: Diagnosis not present

## 2018-02-08 ENCOUNTER — Other Ambulatory Visit: Payer: Self-pay | Admitting: Cardiology

## 2018-05-17 DIAGNOSIS — Z043 Encounter for examination and observation following other accident: Secondary | ICD-10-CM | POA: Diagnosis not present

## 2018-05-17 DIAGNOSIS — S0083XA Contusion of other part of head, initial encounter: Secondary | ICD-10-CM | POA: Diagnosis not present

## 2018-05-17 DIAGNOSIS — E039 Hypothyroidism, unspecified: Secondary | ICD-10-CM | POA: Diagnosis not present

## 2018-05-17 DIAGNOSIS — Z79899 Other long term (current) drug therapy: Secondary | ICD-10-CM | POA: Diagnosis not present

## 2018-05-17 DIAGNOSIS — Z7902 Long term (current) use of antithrombotics/antiplatelets: Secondary | ICD-10-CM | POA: Diagnosis not present

## 2018-05-17 DIAGNOSIS — Z950 Presence of cardiac pacemaker: Secondary | ICD-10-CM | POA: Diagnosis not present

## 2018-05-17 DIAGNOSIS — D631 Anemia in chronic kidney disease: Secondary | ICD-10-CM | POA: Diagnosis not present

## 2018-05-17 DIAGNOSIS — I252 Old myocardial infarction: Secondary | ICD-10-CM | POA: Diagnosis not present

## 2018-05-17 DIAGNOSIS — E1122 Type 2 diabetes mellitus with diabetic chronic kidney disease: Secondary | ICD-10-CM | POA: Diagnosis not present

## 2018-05-17 DIAGNOSIS — N183 Chronic kidney disease, stage 3 (moderate): Secondary | ICD-10-CM | POA: Diagnosis not present

## 2018-05-17 DIAGNOSIS — I130 Hypertensive heart and chronic kidney disease with heart failure and stage 1 through stage 4 chronic kidney disease, or unspecified chronic kidney disease: Secondary | ICD-10-CM | POA: Diagnosis not present

## 2018-05-17 DIAGNOSIS — Z7982 Long term (current) use of aspirin: Secondary | ICD-10-CM | POA: Diagnosis not present

## 2018-05-17 DIAGNOSIS — I509 Heart failure, unspecified: Secondary | ICD-10-CM | POA: Diagnosis not present

## 2018-05-17 DIAGNOSIS — I2510 Atherosclerotic heart disease of native coronary artery without angina pectoris: Secondary | ICD-10-CM | POA: Diagnosis not present

## 2018-06-06 ENCOUNTER — Other Ambulatory Visit: Payer: Self-pay | Admitting: Cardiology

## 2018-06-19 NOTE — Progress Notes (Signed)
Cardiology Office Note:    Date:  06/20/2018   ID:  Anthony Pew [name], DOB May 27, 1926, MRN [id number]  PCP:  [name], MD  Cardiologist:  Shirlee More, MD    Referring MD: [name], MD    ASSESSMENT:    1. Chronic combined systolic and diastolic heart failure ([place])   2. Hypertensive heart disease with heart failure ([place])   3. Pacemaker   4. Coronary artery disease involving native coronary artery of native heart with angina pectoris ([place])   5. Mixed dyslipidemia   6. Chronic renal impairment, unspecified CKD stage    PLAN:    In order of problems listed above:  1. His heart failure is compensated, New York Heart Association class I he has no edema and will continue his current dose of loop diuretic he is at risk for renal dysfunction hypokalemia we will continue his current potassium supplement and check renal function today along with proBNP level 2. Stable blood pressure target continue current treatment long-acting calcium channel blocker and beta-blocker 3. Overdue for follow-up referred to our EP and will begin to assume pacemaker follow-up in our practice now that we return to his community 4. Stable CAD after multiple PCI's he has no anginal discomfort class I at this time and continue medical treatment and would not pursue an ischemia evaluation 5. Stable continue with statin with his age and increased risk for muscular toxicity takes a low intensity statin will check liver function and lipid profile today 6. Recheck renal function today 7. Hypothyroidism, continue his current thyroid supplement managed by his PCP 8. Frailty, he is now using a cane after his falls   Next appointment: 6 months   Medication Adjustments/Labs and Tests Ordered: Current medicines are reviewed at length with the patient today.  Concerns regarding medicines are outlined above.  No orders of the defined types were placed in this encounter.  No orders of the  defined types were placed in this encounter.   Chief Complaint  Patient presents with  . 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 His last pacemaker check was 08/11/17 at [facility] and is RV paced 53% of the time. He is not being followed at this time. Compliance with diet, lifestyle and medications: yes  He appears to be becoming increasingly frail he had a fall in November was seen in the emergency room 1 last week with an ecchymosis above his left eye.  He is not having syncope.  Terms of heart failure is done well without shortness of breath or edema in terms of CAD has done well has had no anginal discomfort in terms of his heart block and pacemaker he has had no palpitation or syncope.  He and his caregiver do not do not think he has had a rate of any recent labs performed will check renal function liver function lipid profile and a proBNP today.  He presently is between pacemaker follow-up programs and will be referred to see Dr. Curt Bears in follow-up in our device clinic.  I will plan to see back in the office in 6 months he presently is doing well on medical treatment I do not think he requires an ischemia evaluation repeat echocardiogram Past Medical History:  Diagnosis Date  . Acquired hypothyroidism 12/20/2016  . ANEMIA 02/21/2010   Qualifier: Diagnosis of  By: Nelson-Smith CMA (AAMA), Dottie    . Benign nodular prostatic hyperplasia 11/21/2014  . Carotid bruit 09/29/2011  . Chronic combined systolic and diastolic heart failure ([place]) 05/21/2015  . Chronic renal insufficiency 09/28/2011  . Coronary artery disease 09/28/2011   Overview:  a.  Exertional chest discomfort/arm pain with dyspnea.    b.  Bare Metal Stent to proximal left circumflex,  (09/1998).    c.  Cypher Stent to mid LAD, (06/03/2005).  d.  MRI, (06/10/2010):  demonstrating normal LVEF with evidence of mild--moderate infarct of inferolateral subendocardial region with peri-infarct ischemia in the left circumflex distribution.    e.  Status post cardiac catheterization (06/23/2010) with drug-eluting stent placed at 60-80% lesion in mid-RCA.    f.  Status post cardiac cath (11/14/2010) to evaluate recurrent chest pain/arm pain and external Adenosine stress nuclear positive for septal ischemia.  Catheterization demonstrated patent stents with no change in coronary anatomy from previous study of 06/23/2010.    . Essential hypertension 12/20/2016  . Gout 09/28/2011  . History of alcohol abuse 09/28/2011  . History of nephrolithiasis 09/28/2011  . History of rheumatic fever 09/28/2011  . History of tobacco abuse 09/28/2011  . Hyperlipidemia 01/14/2017  . Hypertension 09/28/2011  . Mixed dyslipidemia 12/20/2016  . Multinodular thyroid 09/28/2011  . Near syncope 12/20/2016  . Pacemaker reprogramming/check 05/21/2015   Overview:  Dual chamber 12/24/14  . Peptic ulcer disease 09/28/2011  . Presence of stent in coronary artery in patient with coronary artery disease 12/20/2016  . Prostate nodule 11/21/2014  . Sinus node dysfunction ([place]) 12/24/2014    Past Surgical History:  Procedure Laterality Date  . CORONARY ANGIOPLASTY    . PACEMAKER IMPLANT    . PENILE PROSTHESIS IMPLANT    . THYROID SURGERY      Current Medications: No outpatient medications have been marked as taking for the 06/20/18 encounter (Office Visit) with Richardo Priest, MD.     Allergies:   Lorazepam and Crestor [rosuvastatin calcium]   Social History   Socioeconomic History  . Marital status: Widowed    Spouse name: Not on file  . Number of children: Not on file  . Years of education: Not on file  . Highest education level: Not on file  Occupational History  . Not on file  Social Needs  . Financial  resource strain: Not on file  . Food insecurity:    Worry: Not on file    Inability: Not on file  . Transportation needs:    Medical: Not on file    Non-medical: Not on file  Tobacco Use  . Smoking status: Former Smoker    Types: Cigarettes  . Smokeless tobacco: Never Used  Substance and Sexual Activity  . Alcohol use: No  . Drug use: No  . Sexual activity: Not on file  Lifestyle  . Physical activity:    Days per week: Not on file    Minutes per session: Not on file  . Stress: Not on file  Relationships  . Social connections:    Talks on phone: Not on file    Gets together: Not on file    Attends religious service: Not on file    Active member of club or organization: Not on file    Attends meetings of clubs or organizations: Not on file    Relationship status: Not on file  Other Topics Concern  .  Not on file  Social History Narrative  . Not on file     Family History: The patient's family history includes CAD in his father. ROS:   Please see the history of present illness.    All other systems reviewed and are negative.  EKGs/Labs/Other Studies Reviewed:    The following studies were reviewed today:    Recent Labs: No results found for requested labs within last 8760 hours.  Recent Lipid Panel    Component Value Date/Time   CHOL 225 (H) 05/17/2017 1035   TRIG 103 05/17/2017 1035   HDL 65 05/17/2017 1035   CHOLHDL 3.5 05/17/2017 1035   LDLCALC 139 (H) 05/17/2017 1035    Physical Exam:    VS:  There were no vitals taken for this visit.    Wt Readings from Last 3 Encounters:  05/17/17 157 lb (71.2 kg)  05/12/17 160 lb 1.9 oz (72.6 kg)  02/12/17 158 lb 1.9 oz (71.7 kg)     GEN: Looks frail Well nourished, well developed in no acute distress HEENT: Normal NECK: No JVD; No carotid bruits LYMPHATICS: No lymphadenopathy CARDIAC: RRR, no murmurs, rubs, gallops RESPIRATORY:  Clear to auscultation without rales, wheezing or rhonchi  ABDOMEN: Soft,  non-tender, non-distended MUSCULOSKELETAL:  No edema; No deformity  SKIN: Warm and dry NEUROLOGIC:  Alert and oriented x 3 PSYCHIATRIC:  Normal affect    Signed, Shirlee More, MD  06/20/2018 4:18 PM    [facility]

## 2018-06-20 ENCOUNTER — Encounter: Payer: Self-pay | Admitting: Cardiology

## 2018-06-20 ENCOUNTER — Ambulatory Visit (INDEPENDENT_AMBULATORY_CARE_PROVIDER_SITE_OTHER): Payer: Medicare Other | Admitting: Cardiology

## 2018-06-20 VITALS — BP 118/62 | HR 109 | Ht 67.0 in | Wt 154.0 lb

## 2018-06-20 DIAGNOSIS — I25119 Atherosclerotic heart disease of native coronary artery with unspecified angina pectoris: Secondary | ICD-10-CM

## 2018-06-20 DIAGNOSIS — I110 Hypertensive heart disease with heart failure: Secondary | ICD-10-CM | POA: Diagnosis not present

## 2018-06-20 DIAGNOSIS — E782 Mixed hyperlipidemia: Secondary | ICD-10-CM | POA: Diagnosis not present

## 2018-06-20 DIAGNOSIS — I5042 Chronic combined systolic (congestive) and diastolic (congestive) heart failure: Secondary | ICD-10-CM | POA: Diagnosis not present

## 2018-06-20 DIAGNOSIS — Z950 Presence of cardiac pacemaker: Secondary | ICD-10-CM | POA: Diagnosis not present

## 2018-06-20 DIAGNOSIS — N189 Chronic kidney disease, unspecified: Secondary | ICD-10-CM | POA: Diagnosis not present

## 2018-06-20 NOTE — Patient Instructions (Signed)
Medication Instructions:  Your physician recommends that you continue on your current medications as directed. Please refer to the Current Medication list given to you today.  If you need a refill on your cardiac medications before your next appointment, please call your pharmacy.   Lab work: You will have lab work today:  BMP, ProBNP, and Lipids If you have labs (blood work) drawn today and your tests are completely normal, you will receive your results only by: Marland Kitchen MyChart Message (if you have MyChart) OR . A paper copy in the mail If you have any lab test that is abnormal or we need to change your treatment, we will call you to review the results.  Testing/Procedures: NONE  Follow-Up: At [facility], you and your health needs are our priority.  As part of our continuing mission to provide you with exceptional heart care, we have created designated Provider Care Teams.  These Care Teams include your primary Cardiologist (physician) and Advanced Practice Providers (APPs -  Physician Assistants and Nurse Practitioners) who all work together to provide you with the care you need, when you need it. You will need a follow up appointment in 6 months.  Please call our office 2 months in advance to schedule this appointment.

## 2018-06-23 LAB — LIPID PANEL
Chol/HDL Ratio: 3.1 ratio (ref 0.0–5.0)
Cholesterol, Total: 215 mg/dL — ABNORMAL HIGH (ref 100–199)
HDL: 70 mg/dL (ref >39)
LDL Calculated: 117 mg/dL — ABNORMAL HIGH (ref 0–99)
Triglycerides: 138 mg/dL (ref 0–149)
VLDL Cholesterol Cal: 28 mg/dL (ref 5–40)

## 2018-06-23 LAB — BASIC METABOLIC PANEL
BUN / CREAT RATIO: 14 (ref 10–24)
BUN: 24 mg/dL (ref 10–36)
CO2: 19 mmol/L — ABNORMAL LOW (ref 20–29)
CREATININE: 1.67 mg/dL — AB (ref 0.76–1.27)
Calcium: 9.3 mg/dL (ref 8.6–10.2)
Chloride: 101 mmol/L (ref 96–106)
GFR calc Af Amer: 41 mL/min/{1.73_m2} — ABNORMAL LOW (ref >59)
GFR, EST NON AFRICAN AMERICAN: 35 mL/min/{1.73_m2} — AB (ref >59)
Glucose: 104 mg/dL — ABNORMAL HIGH (ref 65–99)
Potassium: 4.1 mmol/L (ref 3.5–5.2)
SODIUM: 140 mmol/L (ref 134–144)

## 2018-06-23 LAB — PRO B NATRIURETIC PEPTIDE

## 2018-06-24 ENCOUNTER — Other Ambulatory Visit: Payer: Self-pay | Admitting: Cardiology

## 2018-06-28 DIAGNOSIS — Z23 Encounter for immunization: Secondary | ICD-10-CM | POA: Diagnosis not present

## 2018-07-11 ENCOUNTER — Encounter: Payer: Medicare Other | Admitting: Cardiology

## 2018-07-11 NOTE — Progress Notes (Deleted)
Electrophysiology Office Note   Date:  07/11/2018   ID:  Anthony Jensen., DOB May 30, 1926, MRN [id number]  PCP:  Anthony Sheriff, MD  Cardiologist:  Anthony Jensen Primary Electrophysiologist: Anthony Popescu [name], MD    No chief complaint on file.    History of Present Illness: Anthony Jensen. is a 85 y.o. male who is being seen today for the evaluation of CHF at the request of Anthony Sheriff, MD. Presenting today for electrophysiology evaluation.  He has a history of essential hypertension, chronic combined systolic and diastolic heart failure, coronary artery disease with stable angina, and hyperlipidemia.  Today, denies symptoms of palpitations, chest pain, shortness of breath, orthopnea, PND, lower extremity edema, claudication, dizziness, presyncope, syncope, bleeding, or neurologic sequela. The patient is tolerating medications without difficulties. ***   Past Medical History:  Diagnosis Date  . Acquired hypothyroidism 12/20/2016  . ANEMIA 02/21/2010   Qualifier: Diagnosis of  By: Anthony CMA (AAMA), Jensen    . Benign nodular prostatic hyperplasia 11/21/2014  . Carotid bruit 09/29/2011  . Chronic combined systolic and diastolic heart failure ([place]) 05/21/2015  . Chronic renal insufficiency 09/28/2011  . Coronary artery disease 09/28/2011   Overview:  a.  Exertional chest discomfort/arm pain with dyspnea.    b.  Bare Metal Stent to proximal left circumflex, (09/1998).    c.  Cypher Stent to mid LAD, (06/03/2005).  d.  MRI, (06/10/2010):  demonstrating normal LVEF with evidence of mild--moderate infarct of inferolateral subendocardial region with peri-infarct ischemia in the left circumflex distribution.    e.  Status post cardiac catheterization (06/23/2010) with drug-eluting stent placed at 60-80% lesion in mid-RCA.    f.  Status post cardiac cath (11/14/2010) to evaluate recurrent chest pain/arm pain and external Adenosine stress nuclear positive for septal  ischemia.  Catheterization demonstrated patent stents with no change in coronary anatomy from previous study of 06/23/2010.    . Essential hypertension 12/20/2016  . Gout 09/28/2011  . History of alcohol abuse 09/28/2011  . History of nephrolithiasis 09/28/2011  . History of rheumatic fever 09/28/2011  . History of tobacco abuse 09/28/2011  . Hyperlipidemia 01/14/2017  . Hypertension 09/28/2011  . Mixed dyslipidemia 12/20/2016  . Multinodular thyroid 09/28/2011  . Near syncope 12/20/2016  . Pacemaker reprogramming/check 05/21/2015   Overview:  Dual chamber 12/24/14  . Peptic ulcer disease 09/28/2011  . Presence of stent in coronary artery in patient with coronary artery disease 12/20/2016  . Prostate nodule 11/21/2014  . Sinus node dysfunction ([place]) 12/24/2014   Past Surgical History:  Procedure Laterality Date  . CORONARY ANGIOPLASTY    . PACEMAKER IMPLANT    . PENILE PROSTHESIS IMPLANT    . THYROID SURGERY       Current Outpatient Medications  Medication Sig Dispense Refill  . amLODipine (NORVASC) 10 MG tablet Take 0.5 tablets (5 mg total) by mouth daily. Do not take if systolic BP , 672 30 tablet 6  . amLODipine (NORVASC) 5 MG tablet TAKE 1 TABLET BY MOUTH EVERY MORNING, TAKE A 2ND TABLET IN THE EVENING ON MONDAY,WEDNESDAY AND FRIDAY 180 tablet 3  . aspirin 81 MG tablet Take 81 mg by mouth daily.    Marland Kitchen BYSTOLIC 5 MG tablet TAKE ONE-HALF TABLET ONCE DAILY 7 tablet 0  . clopidogrel (PLAVIX) 75 MG tablet TAKE ONE (1) TABLET ONCE DAILY 30 tablet 1  . furosemide (LASIX) 40 MG tablet TAKE 1 TABLET BY MOUTH DAILY AS NEEDED FOR SYSTOLIC > 094  OR EDEMA 30 tablet 6  . levothyroxine (SYNTHROID, LEVOTHROID) 88 MCG tablet Take 88 mcg by mouth daily.    . potassium chloride SA (K-DUR,KLOR-CON) 20 MEQ tablet TAKE 1/2 TABLET DAILY 90 tablet 0  . pravastatin (PRAVACHOL) 20 MG tablet Take 1 tablet (20 mg total) every other day by mouth. 45 tablet 3  . tamsulosin (FLOMAX) 0.4 MG CAPS capsule Take 0.4 mg by  mouth daily.     No current facility-administered medications for this visit.     Allergies:   Lorazepam and Crestor [rosuvastatin calcium]   Social History:  The patient  reports that he has quit smoking. His smoking use included cigarettes. He has never used smokeless tobacco. He reports that he does not drink alcohol or use drugs.   Family History:  The patient's family history includes CAD in his father.    ROS:  Please see the history of present illness.   Otherwise, review of systems is positive for ***.   All other systems are reviewed and negative.   PHYSICAL EXAM: VS:  There were no vitals taken for this visit. , BMI There is no height or weight on file to calculate BMI. GEN: Well nourished, well developed, in no acute distress  HEENT: normal  Neck: no JVD, carotid bruits, or masses Cardiac: ***RRR; no murmurs, rubs, or gallops,no edema  Respiratory:  clear to auscultation bilaterally, normal work of breathing GI: soft, nontender, nondistended, + BS MS: no deformity or atrophy  Skin: warm and dry, ***device site well healed Neuro:  Strength and sensation are intact Psych: euthymic mood, full affect  EKG:  EKG {ACTION; IS/IS [id number]} ordered today. Personal review of the ekg ordered *** shows ***  ***Personal review of the device interrogation today. Results in [facility]: 06/20/2018: BUN 24; Creatinine, Ser 1.67; NT-Pro BNP CANCELED; Potassium 4.1; Sodium 140    Lipid Panel     Component Value Date/Time   CHOL 215 (H) 06/20/2018 1645   TRIG 138 06/20/2018 1645   HDL 70 06/20/2018 1645   CHOLHDL 3.1 06/20/2018 1645   LDLCALC 117 (H) 06/20/2018 1645     Wt Readings from Last 3 Encounters:  06/20/18 154 lb (69.9 kg)  05/17/17 157 lb (71.2 kg)  05/12/17 160 lb 1.9 oz (72.6 kg)      Other studies Reviewed: Additional studies/ records that were reviewed today include: Cardiac cath 04/2016  Review of the above records today demonstrates:    LMCA: 0%. LAD: Lesion on Dist LAD: 60% stenosis. LCx: Lesion on Mid CX: 30% stenosis. RCA: Lesion on R PDA: 60% stenosis.   ASSESSMENT AND PLAN:  1.  Sinus node dysfunction: Status post Medtronic dual-chamber pacemaker.  ***  2.  Hypertension:***  3.  Coronary artery disease with stable angina: ***  Current medicines are reviewed at length with the patient today.   The patient does not have concerns regarding his medicines.  The following changes were made today:  ***  Labs/ tests ordered today include:  No orders of the defined types were placed in this encounter.    Disposition:   FU with Xaiden Fleig *** year  Signed, Indiana Gamero [name], MD  07/11/2018 1:44 PM     [place] [address] [phone] (office) [phone] (fax)

## 2018-07-19 ENCOUNTER — Other Ambulatory Visit: Payer: Self-pay | Admitting: Cardiology

## 2018-07-19 DIAGNOSIS — I10 Essential (primary) hypertension: Secondary | ICD-10-CM

## 2018-07-19 DIAGNOSIS — I5042 Chronic combined systolic (congestive) and diastolic (congestive) heart failure: Secondary | ICD-10-CM

## 2018-07-25 ENCOUNTER — Encounter: Payer: Medicare Other | Admitting: Cardiology

## 2018-08-08 DIAGNOSIS — H6123 Impacted cerumen, bilateral: Secondary | ICD-10-CM | POA: Diagnosis not present

## 2018-08-20 ENCOUNTER — Inpatient Hospital Stay (HOSPITAL_COMMUNITY)
Admission: EM | Admit: 2018-08-20 | Discharge: 2018-08-22 | DRG: 062 | Disposition: A | Discharge status: Home / Self Care | Payer: Medicare Other | Attending: Neurology | Admitting: Neurology

## 2018-08-20 ENCOUNTER — Other Ambulatory Visit: Payer: Self-pay

## 2018-08-20 ENCOUNTER — Encounter (HOSPITAL_COMMUNITY): Payer: Self-pay

## 2018-08-20 ENCOUNTER — Emergency Department (HOSPITAL_COMMUNITY): Payer: Medicare Other

## 2018-08-20 DIAGNOSIS — G8194 Hemiplegia, unspecified affecting left nondominant side: Secondary | ICD-10-CM | POA: Diagnosis present

## 2018-08-20 DIAGNOSIS — I5042 Chronic combined systolic (congestive) and diastolic (congestive) heart failure: Secondary | ICD-10-CM | POA: Diagnosis present

## 2018-08-20 DIAGNOSIS — R2681 Unsteadiness on feet: Secondary | ICD-10-CM | POA: Diagnosis present

## 2018-08-20 DIAGNOSIS — Z7982 Long term (current) use of aspirin: Secondary | ICD-10-CM | POA: Diagnosis not present

## 2018-08-20 DIAGNOSIS — R29818 Other symptoms and signs involving the nervous system: Secondary | ICD-10-CM | POA: Diagnosis not present

## 2018-08-20 DIAGNOSIS — R29704 NIHSS score 4: Secondary | ICD-10-CM | POA: Diagnosis present

## 2018-08-20 DIAGNOSIS — F1011 Alcohol abuse, in remission: Secondary | ICD-10-CM | POA: Diagnosis present

## 2018-08-20 DIAGNOSIS — E785 Hyperlipidemia, unspecified: Secondary | ICD-10-CM

## 2018-08-20 DIAGNOSIS — Z8711 Personal history of peptic ulcer disease: Secondary | ICD-10-CM | POA: Diagnosis not present

## 2018-08-20 DIAGNOSIS — R0902 Hypoxemia: Secondary | ICD-10-CM | POA: Diagnosis not present

## 2018-08-20 DIAGNOSIS — I2510 Atherosclerotic heart disease of native coronary artery without angina pectoris: Secondary | ICD-10-CM | POA: Diagnosis present

## 2018-08-20 DIAGNOSIS — I10 Essential (primary) hypertension: Secondary | ICD-10-CM

## 2018-08-20 DIAGNOSIS — E039 Hypothyroidism, unspecified: Secondary | ICD-10-CM | POA: Diagnosis present

## 2018-08-20 DIAGNOSIS — R29810 Facial weakness: Secondary | ICD-10-CM | POA: Diagnosis present

## 2018-08-20 DIAGNOSIS — M109 Gout, unspecified: Secondary | ICD-10-CM | POA: Diagnosis present

## 2018-08-20 DIAGNOSIS — I110 Hypertensive heart disease with heart failure: Secondary | ICD-10-CM | POA: Diagnosis present

## 2018-08-20 DIAGNOSIS — Z79899 Other long term (current) drug therapy: Secondary | ICD-10-CM

## 2018-08-20 DIAGNOSIS — Z950 Presence of cardiac pacemaker: Secondary | ICD-10-CM | POA: Diagnosis present

## 2018-08-20 DIAGNOSIS — R402252 Coma scale, best verbal response, oriented, at arrival to emergency department: Secondary | ICD-10-CM | POA: Diagnosis present

## 2018-08-20 DIAGNOSIS — Z79890 Hormone replacement therapy (postmenopausal): Secondary | ICD-10-CM | POA: Diagnosis not present

## 2018-08-20 DIAGNOSIS — I639 Cerebral infarction, unspecified: Principal | ICD-10-CM | POA: Diagnosis present

## 2018-08-20 DIAGNOSIS — Z8249 Family history of ischemic heart disease and other diseases of the circulatory system: Secondary | ICD-10-CM | POA: Diagnosis not present

## 2018-08-20 DIAGNOSIS — I130 Hypertensive heart and chronic kidney disease with heart failure and stage 1 through stage 4 chronic kidney disease, or unspecified chronic kidney disease: Secondary | ICD-10-CM | POA: Diagnosis present

## 2018-08-20 DIAGNOSIS — E782 Mixed hyperlipidemia: Secondary | ICD-10-CM | POA: Diagnosis present

## 2018-08-20 DIAGNOSIS — I340 Nonrheumatic mitral (valve) insufficiency: Secondary | ICD-10-CM | POA: Diagnosis not present

## 2018-08-20 DIAGNOSIS — R260 Ataxic gait: Secondary | ICD-10-CM | POA: Diagnosis present

## 2018-08-20 DIAGNOSIS — N289 Disorder of kidney and ureter, unspecified: Secondary | ICD-10-CM

## 2018-08-20 DIAGNOSIS — I739 Peripheral vascular disease, unspecified: Secondary | ICD-10-CM | POA: Diagnosis present

## 2018-08-20 DIAGNOSIS — I371 Nonrheumatic pulmonary valve insufficiency: Secondary | ICD-10-CM | POA: Diagnosis not present

## 2018-08-20 DIAGNOSIS — R402142 Coma scale, eyes open, spontaneous, at arrival to emergency department: Secondary | ICD-10-CM | POA: Diagnosis present

## 2018-08-20 DIAGNOSIS — Z955 Presence of coronary angioplasty implant and graft: Secondary | ICD-10-CM | POA: Diagnosis not present

## 2018-08-20 DIAGNOSIS — Z7902 Long term (current) use of antithrombotics/antiplatelets: Secondary | ICD-10-CM

## 2018-08-20 DIAGNOSIS — R402362 Coma scale, best motor response, obeys commands, at arrival to emergency department: Secondary | ICD-10-CM | POA: Diagnosis present

## 2018-08-20 DIAGNOSIS — I509 Heart failure, unspecified: Secondary | ICD-10-CM

## 2018-08-20 DIAGNOSIS — N183 Chronic kidney disease, stage 3 (moderate): Secondary | ICD-10-CM | POA: Diagnosis present

## 2018-08-20 DIAGNOSIS — Z87891 Personal history of nicotine dependence: Secondary | ICD-10-CM

## 2018-08-20 DIAGNOSIS — I6300 Cerebral infarction due to thrombosis of unspecified precerebral artery: Secondary | ICD-10-CM | POA: Diagnosis not present

## 2018-08-20 DIAGNOSIS — N189 Chronic kidney disease, unspecified: Secondary | ICD-10-CM | POA: Diagnosis present

## 2018-08-20 DIAGNOSIS — Z888 Allergy status to other drugs, medicaments and biological substances status: Secondary | ICD-10-CM

## 2018-08-20 HISTORY — DX: Cerebral infarction, unspecified: I63.9

## 2018-08-20 LAB — COMPREHENSIVE METABOLIC PANEL
ALT: 14 U/L (ref 0–44)
AST: 28 U/L (ref 15–41)
Albumin: 3.7 g/dL (ref 3.5–5.0)
Alkaline Phosphatase: 69 U/L (ref 38–126)
Anion gap: 12 (ref 5–15)
BUN: 16 mg/dL (ref 8–23)
CO2: 24 mmol/L (ref 22–32)
Calcium: 9.3 mg/dL (ref 8.9–10.3)
Chloride: 100 mmol/L (ref 98–111)
Creatinine, Ser: 1.69 mg/dL — ABNORMAL HIGH (ref 0.61–1.24)
GFR calc Af Amer: 41 mL/min — ABNORMAL LOW (ref >60)
GFR calc non Af Amer: 35 mL/min — ABNORMAL LOW (ref >60)
Glucose, Bld: 103 mg/dL — ABNORMAL HIGH (ref 70–99)
Potassium: 4.3 mmol/L (ref 3.5–5.1)
Sodium: 136 mmol/L (ref 135–145)
Total Bilirubin: 0.8 mg/dL (ref 0.3–1.2)
Total Protein: 6.8 g/dL (ref 6.5–8.1)

## 2018-08-20 LAB — DIFFERENTIAL
Abs Immature Granulocytes: 0.01 10*3/uL (ref 0.00–0.07)
Basophils Absolute: 0 10*3/uL (ref 0.0–0.1)
Basophils Relative: 1 %
EOS ABS: 0.3 10*3/uL (ref 0.0–0.5)
Eosinophils Relative: 6 %
Immature Granulocytes: 0 %
Lymphocytes Relative: 26 %
Lymphs Abs: 1.2 10*3/uL (ref 0.7–4.0)
Monocytes Absolute: 0.4 10*3/uL (ref 0.1–1.0)
Monocytes Relative: 9 %
Neutro Abs: 2.6 10*3/uL (ref 1.7–7.7)
Neutrophils Relative %: 58 %

## 2018-08-20 LAB — CBC
HEMATOCRIT: 36.9 % — AB (ref 39.0–52.0)
Hemoglobin: 11.7 g/dL — ABNORMAL LOW (ref 13.0–17.0)
MCH: 27.1 pg (ref 26.0–34.0)
MCHC: 31.7 g/dL (ref 30.0–36.0)
MCV: 85.4 fL (ref 80.0–100.0)
PLATELETS: 205 10*3/uL (ref 150–400)
RBC: 4.32 MIL/uL (ref 4.22–5.81)
RDW: 13.7 % (ref 11.5–15.5)
WBC: 4.5 10*3/uL (ref 4.0–10.5)
nRBC: 0 % (ref 0.0–0.2)

## 2018-08-20 LAB — PROTIME-INR
INR: 1.02
PROTHROMBIN TIME: 13.3 s (ref 11.4–15.2)

## 2018-08-20 LAB — MRSA PCR SCREENING: MRSA by PCR: NEGATIVE

## 2018-08-20 LAB — CBG MONITORING, ED: GLUCOSE-CAPILLARY: 108 mg/dL — AB (ref 70–99)

## 2018-08-20 LAB — APTT: aPTT: 27 seconds (ref 24–36)

## 2018-08-20 LAB — I-STAT CREATININE, ED: Creatinine, Ser: 1.7 mg/dL — ABNORMAL HIGH (ref 0.61–1.24)

## 2018-08-20 MED ORDER — ALTEPLASE (STROKE) FULL DOSE INFUSION
0.9000 mg/kg | Freq: Once | INTRAVENOUS | Status: AC
  Administered 2018-08-20: 56.3 mg via INTRAVENOUS
  Filled 2018-08-20: qty 100

## 2018-08-20 MED ORDER — CLEVIDIPINE BUTYRATE 0.5 MG/ML IV EMUL
0.0000 mg/h | INTRAVENOUS | Status: DC
  Administered 2018-08-20 (×2): 1 mg/h via INTRAVENOUS
  Administered 2018-08-21: 2 mg/h via INTRAVENOUS
  Filled 2018-08-20 (×2): qty 50

## 2018-08-20 MED ORDER — SODIUM CHLORIDE 0.9 % IV SOLN
INTRAVENOUS | Status: DC
  Administered 2018-08-20 – 2018-08-21 (×2): via INTRAVENOUS

## 2018-08-20 MED ORDER — STROKE: EARLY STAGES OF RECOVERY BOOK
Freq: Once | Status: AC
  Administered 2018-08-20: 1

## 2018-08-20 MED ORDER — PANTOPRAZOLE SODIUM 40 MG IV SOLR
40.0000 mg | Freq: Every day | INTRAVENOUS | Status: DC
  Administered 2018-08-20: 40 mg via INTRAVENOUS
  Filled 2018-08-20: qty 40

## 2018-08-20 MED ORDER — ACETAMINOPHEN 160 MG/5ML PO SOLN: 650.0000 mg | ORAL | Status: DC | PRN

## 2018-08-20 MED ORDER — ACETAMINOPHEN 650 MG RE SUPP: 650.0000 mg | RECTAL | Status: DC | PRN

## 2018-08-20 MED ORDER — SENNOSIDES-DOCUSATE SODIUM 8.6-50 MG PO TABS: 1.0000 | ORAL_TABLET | Freq: Every evening | ORAL | Status: DC | PRN

## 2018-08-20 MED ORDER — LABETALOL HCL 5 MG/ML IV SOLN: 10.0000 mg | INTRAVENOUS | Status: DC | PRN

## 2018-08-20 MED ORDER — SODIUM CHLORIDE 0.9% FLUSH
3.0000 mL | Freq: Once | INTRAVENOUS | Status: AC
Start: 2018-08-20 — End: 2018-08-20
  Administered 2018-08-20: 3 mL via INTRAVENOUS

## 2018-08-20 MED ORDER — ACETAMINOPHEN 325 MG PO TABS
650.0000 mg | ORAL_TABLET | ORAL | Status: DC | PRN
  Administered 2018-08-21: 650 mg via ORAL
  Filled 2018-08-20: qty 2

## 2018-08-20 NOTE — ED Notes (Signed)
Pt has medtronic pacemaker, needs to be followed by cardiologist before MRI. [employer] will need to be contacted after that to come out and be with patient for MRI. There are not reps that come out on weekends so it will probably be Monday. MRI needs to be contacted after cardiology is following patient.

## 2018-08-20 NOTE — Progress Notes (Signed)
Pharmacist Code Stroke Response  Notified to mix tPA at 1603 by Dr. Lorraine Lax Delivered tPA to RN at 1605  tPA dose = 6.3mg  bolus over 1 minute followed by 56.3mg  for a total dose of 62.6mg  over 1 hour  Issues/delays encountered (if applicable): none  Elicia Lamp, PharmD, BCPS Please check AMION for all [place] contact numbers Clinical Pharmacist 08/20/2018 4:13 PM

## 2018-08-20 NOTE — Plan of Care (Signed)
  Problem: Education: Goal: Knowledge of disease or condition will improve Outcome: Progressing   

## 2018-08-20 NOTE — Progress Notes (Signed)
Pt has medtronic pacer and leads. Interrogated and found to be conditional for MR scan. Medtr[employer] reps needed for scan and according to local reps they are no longer coming out on weekends for scans. A Cardiologist needs to follow the pt, and fill out general paperwork pre-MRI for scan on Monday. RN aware.

## 2018-08-20 NOTE — H&P (Addendum)
NEURO HOSPITALIST  H&P      Chief Complaint:  "cant control my legs"  History obtained from:  Patient     HPI:                                                                                                                                          Anthony Arwood. is an 85 y.o. male  With PMH HLD, HTN, CAD, chronic combined HF , implanted pacemaker, who presented to [facility] ED as a code stroke for c/o gait instability and bilateral leg weakness.   No prior stroke history. Patient was completely normal. Was out driving and was with his daughter. At 1445 had a sudden onset of unsteady gait and bilateral leg weakness. Dr. Rhona Raider states that he has had no other symptoms. Denies CP, SOB, vision changes, arm weakness. Patient is on ASA and plavix, no anticoagulation.    ED course:  CTH: no hemorrhage.  Creatinine: 1.70  69.9kg Date last known well: 08-20-2018 Time last known well: 1445 tPA Given: Yes; 1608 Modified Rankin: Rankin Score=0 NIHSS:4; left ataxia arm/leg, left leg drift, facial droop     Past Medical History:  Diagnosis Date  . Acquired hypothyroidism 12/20/2016  . ANEMIA 02/21/2010   Qualifier: Diagnosis of  By: Nelson-Smith CMA (AAMA), Dottie    . Benign nodular prostatic hyperplasia 11/21/2014  . Carotid bruit 09/29/2011  . Chronic combined systolic and diastolic heart failure ([place]) 05/21/2015  . Chronic renal insufficiency 09/28/2011  . Coronary artery disease 09/28/2011   Overview:  a.  Exertional chest discomfort/arm pain with dyspnea.    b.  Bare Metal Stent to proximal left circumflex, (09/1998).    c.  Cypher Stent to mid LAD, (06/03/2005).  d.  MRI, (06/10/2010):  demonstrating normal LVEF with evidence of mild--moderate infarct of inferolateral subendocardial region with peri-infarct ischemia in the left circumflex distribution.    e.  Status post cardiac catheterization (06/23/2010) with drug-eluting stent placed  at 60-80% lesion in mid-RCA.    f.  Status post cardiac cath (11/14/2010) to evaluate recurrent chest pain/arm pain and external Adenosine stress nuclear positive for septal ischemia.  Catheterization demonstrated patent stents with no change in coronary anatomy from previous study of 06/23/2010.    . Essential hypertension 12/20/2016  . Gout 09/28/2011  . History of alcohol abuse 09/28/2011  . History of nephrolithiasis 09/28/2011  . History of rheumatic fever 09/28/2011  . History of tobacco abuse 09/28/2011  . Hyperlipidemia 01/14/2017  . Hypertension 09/28/2011  . Mixed dyslipidemia 12/20/2016  . Multinodular thyroid 09/28/2011  . Near syncope 12/20/2016  . Pacemaker reprogramming/check  05/21/2015   Overview:  Dual chamber 12/24/14  . Peptic ulcer disease 09/28/2011  . Presence of stent in coronary artery in patient with coronary artery disease 12/20/2016  . Prostate nodule 11/21/2014  . Sinus node dysfunction ([place]) 12/24/2014    Past Surgical History:  Procedure Laterality Date  . CORONARY ANGIOPLASTY    . PACEMAKER IMPLANT    . PENILE PROSTHESIS IMPLANT    . THYROID SURGERY      Family History  Problem Relation Age of Onset  . CAD Father          Social History:  reports that he has quit smoking. His smoking use included cigarettes. He has never used smokeless tobacco. He reports that he does not drink alcohol or use drugs.  Allergies:  Allergies  Allergen Reactions  . Lorazepam   . Crestor [Rosuvastatin Calcium] Other (See Comments)    myalgias    Medications:                                                                                                                          Current Facility-Administered Medications  Medication Dose Route Frequency Provider Last Rate Last Dose  . sodium chloride flush (NS) 0.9 % injection 3 mL  3 mL Intravenous Once Anthony Schmidt, MD       Current Outpatient Medications  Medication Sig Dispense Refill  . amLODipine (NORVASC) 10 MG tablet  Take 0.5 tablets (5 mg total) by mouth daily. Do not take if systolic BP , 710 30 tablet 6  . amLODipine (NORVASC) 5 MG tablet TAKE 1 TABLET BY MOUTH EVERY MORNING, TAKE A 2ND TABLET IN THE EVENING ON MONDAY,WEDNESDAY AND FRIDAY 180 tablet 3  . aspirin 81 MG tablet Take 81 mg by mouth daily.    Marland Kitchen BYSTOLIC 5 MG tablet TAKE ONE-HALF TABLET ONCE DAILY 7 tablet 0  . clopidogrel (PLAVIX) 75 MG tablet TAKE ONE (1) TABLET ONCE DAILY 30 tablet 1  . furosemide (LASIX) 40 MG tablet TAKE 1 TABLET DAILY AS NEEDED FOR SYSTOLIC > 626 OR EDEMA 90 tablet 1  . levothyroxine (SYNTHROID, LEVOTHROID) 88 MCG tablet Take 88 mcg by mouth daily.    . potassium chloride SA (K-DUR,KLOR-CON) 20 MEQ tablet TAKE 1/2 TABLET DAILY 90 tablet 0  . pravastatin (PRAVACHOL) 20 MG tablet Take 1 tablet (20 mg total) every other day by mouth. 45 tablet 3  . tamsulosin (FLOMAX) 0.4 MG CAPS capsule Take 0.4 mg by mouth daily.       ROS:  ROS was performed and is negative except as noted in HPI   General Examination:                                                                                                      There were no vitals taken for this visit.  HEENT-  Normocephalic, no lesions, without obvious abnormality.  Normal external eye and conjunctiva. Cardiovascular- S1-S2 audible, pulses palpable throughout  Lungs-no rhonchi or wheezing noted, no excessive working breathing.  Saturations within normal limits on RA. Abdomen- All 4 quadrants palpated and non tender Extremities- Warm, dry and intact Musculoskeletal-no joint tenderness, deformity or swelling Skin-warm and dry, no hyperpigmentation, vitiligo, or suspicious lesions  Neurological Examination Mental Status: Alert, oriented, thought content appropriate.  Speech fluent without evidence of aphasia.  Able to follow  commands without  difficulty. Cranial Nerves: II:  Visual fields grossly normal,  III,IV, VI: ptosis not present, extra-ocular motions intact bilaterally, pupils equal, round, reactive to light and accommodation V,VII: smile asymmetric, left facial droop present, facial light touch sensation normal bilaterally VIII: hearing normal bilaterally IX,X: uvula rises midline XI: bilateral shoulder shrug XII: midline tongue extension Motor: Right : Upper extremity   5/5  Left:     Upper extremity   5/5  Lower extremity   5/5   Lower extremity   5/5 Tone and bulk:normal tone throughout; no atrophy noted Sensory: light touch intact throughout, bilaterally Deep Tendon Reflexes: 2+ and symmetric biceps and patella Plantars: Right: downgoing   Left: downgoing Cerebellar: Ataxic FNF LUE, ataxic HTS on LLE normal heel-to-shin test Gait: deferred   Lab Results: Basic Metabolic Panel: Recent Labs  Lab 08/20/18 1600  CREATININE 1.70*    CBC: Recent Labs  Lab 08/20/18 1551  WBC 4.5  NEUTROABS 2.6  HGB 11.7*  HCT 36.9*  MCV 85.4  PLT 205     CBG: Recent Labs  Lab 08/20/18 1545  GLUCAP 108*    Imaging: Ct Head Code Stroke Wo Contrast  Result Date: 08/20/2018 CLINICAL DATA:  Code stroke.  Bilateral leg weakness. EXAM: CT HEAD WITHOUT CONTRAST TECHNIQUE: Contiguous axial images were obtained from the base of the skull through the vertex without intravenous contrast. COMPARISON:  09/25/2012 FINDINGS: Brain: There is no evidence of acute infarct, intracranial hemorrhage, mass, midline shift, or extra-axial fluid collection. Generalized cerebral atrophy is not greater than expected for age. Cerebral white matter hypodensities are similar to the prior study and nonspecific but compatible with mild chronic small vessel ischemic disease. Vascular: Calcified atherosclerosis at the skull base. No hyperdense vessel. Skull: No fracture or focal osseous lesion. Sinuses/Orbits: Visualized paranasal sinuses and  mastoid air cells are clear. Bilateral cataract extraction is noted. Other: None. ASPECTS [facility] Stroke Program Early CT Score) - Ganglionic level infarction (caudate, lentiform nuclei, internal capsule, insula, M1-M3 cortex): 7 - Supraganglionic infarction (M4-M6 cortex): 3 Total score (0-10 with 10 being normal): 10 IMPRESSION: 1. No evidence of acute intracranial abnormality. 2. ASPECTS is 10. 3. Mild chronic small vessel ischemic disease. These results were communicated to Dr. Lorraine Jensen at 4:04 pm  on 08/20/2018 by text page via the [facility] messaging system. Electronically Signed   By: Anthony Jensen M.D.   On: 08/20/2018 16:04   Laurey Morale, MSN, NP-C [facility] [phone]  08/20/2018, 3:51 PM   Attending physician note to follow with Assessment and plan .   Assessment: 85 y.o. male With PMH HLD, HTN, CAD, chronic combined HF who presented to [facility] ED as a code stroke for c/o gait instability and bilateral leg weakness. CTH: no hemorrhage.Discussed very clearly with patient risk and 4-6% chance of bleeding. NIHSS 4  But is disabling as it affects his balance and gait. Given the risk and benefits patient decided that he would receive the TPA. Recommend full stroke work up.  Stroke Risk Factors - hyperlipidemia and hypertension    Plan: -- BP goal :180/186mmHg --MRI Brain  --CTA  --Echocardiogram -- continue  -- High intensity Statin  If LDL > 70 -- HgbA1c, fasting lipid panel -- PT consult, OT consult, Speech consult --Telemetry monitoring --Frequent neuro checks --Stroke swallow screen   CNS -Close neuro monitoring   RESP No acute issue  CV Essential (primary) hypertension  NSTEMI -Cards Consult  GI/GU CKD Stage 3 (GFR 30-59)  -Continue dialysis -Gentle hydration -avoid nephrotoxic agents -renal consult  ENDO -goal HgbA1c < 7  ID Possible Aspiration PNA -CXR -Monitor  Prophylaxis DVT: SCD's only GI: doc/senna   DispoTBD Diet: NPO until  cleared by speech or bedside swallow eval  Code Status: Full Code       --please page stroke NP  Or  PA  Or MD from 8am -4 pm  as this patient from this time will be  followed by the stroke.   You can look them up on [email or web address]  Password [email or web address]    NEUROHOSPITALIST ADDENDUM Performed a face to face diagnostic evaluation.   I have reviewed the contents of history and physical exam as documented by PA/ARNP/Resident and agree with above documentation.  I have discussed and formulated the above plan as documented. Edits to the note have been made as needed.  85 year old male who presents with sudden onset gait instability. on examination patient has ataxia over the left upper and lower extremity with left leg drift and mild facial droop.  CT head negative for any hemorrhage.  Patient on Plavix.  Discussed risk versus benefit of TPA.  Even though NIH stroke scale is low, stroke fairly disabling as it it affects ability to walk.  Patient decided to receive IV TPA knowing that he has a 4 to 6% chance of major intracranial hemorrhage.  Patient was admitted to neuro ICU.  Likely etiology of the stroke small vessel however, needs MRI and vascular imaging as well as echocardiogram, telemetry to further evaluate etiology for stroke.    This patient is neurologically critically ill due to stroke s/p tPA.  He is at risk for significant risk of neurological worsening from cerebral edema,  death from brain herniation, heart failure, hemorrhagic conversion, infection, respiratory failure and seizure. This patient's care requires constant monitoring of vital signs, hemodynamics, respiratory and cardiac monitoring, review of multiple databases, neurological assessment, discussion with family, other specialists and medical decision making of high complexity.  I spent 50 minutes of neurocritical time in the care of this patient.      Karena Addison Anthony Holliman MD [facility] [phone]   If 7pm to 7am,  please call on call as listed on AMION.

## 2018-08-20 NOTE — ED Triage Notes (Signed)
Per [place], [place] 5170 when the pt had sudden onset of difficulty using both legs. Pt had been driving just prior to the event and felt completely normal. Upon further assessment here pt is having difficulty using the left arm and left leg with weakness. Pt noted to have left sided facial droop, left arm/leg ataxia, and left leg drift. Airway intact. VSS. Axox4. Pt taken to CT immediately. Pt has 18ga IV in right AC.

## 2018-08-20 NOTE — ED Notes (Signed)
BP remaining below 854 systolic, no need for cleviprex at this time.

## 2018-08-20 NOTE — ED Notes (Signed)
Pt's NIH improved. Pt still has left leg drift, left arm/leg ataxia, sensory issue and facial drooped improved.

## 2018-08-20 NOTE — ED Provider Notes (Signed)
[facility] EMERGENCY DEPARTMENT Provider Note   CSN: [id number] Arrival date & time: 08/20/18  1543     History   Chief Complaint Chief Complaint  Patient presents with  . Code Stroke   Level 5 caveat: Acuity of condition  HPI Nickalas Mccarrick. is a 85 y.o. male.  HPI Patient is a 85 year old male who presents the emergency department with reported left arm and left leg weakness with associated ataxia.  No complaint of headache.  No chest pain or shortness of breath.  Denies back pain.  No prior history of stroke.  Patient is a retired Engineer, drilling.  Patient has a history of hypertension, hypothyroidism, hypertriglyceridemia.  No prior history of stroke.   Past Medical History:  Diagnosis Date  . Acquired hypothyroidism 12/20/2016  . ANEMIA 02/21/2010   Qualifier: Diagnosis of  By: Nelson-Smith CMA (AAMA), Dottie    . Benign nodular prostatic hyperplasia 11/21/2014  . Carotid bruit 09/29/2011  . Chronic combined systolic and diastolic heart failure ([place]) 05/21/2015  . Chronic renal insufficiency 09/28/2011  . Coronary artery disease 09/28/2011   Overview:  a.  Exertional chest discomfort/arm pain with dyspnea.    b.  Bare Metal Stent to proximal left circumflex, (09/1998).    c.  Cypher Stent to mid LAD, (06/03/2005).  d.  MRI, (06/10/2010):  demonstrating normal LVEF with evidence of mild--moderate infarct of inferolateral subendocardial region with peri-infarct ischemia in the left circumflex distribution.    e.  Status post cardiac catheterization (06/23/2010) with drug-eluting stent placed at 60-80% lesion in mid-RCA.    f.  Status post cardiac cath (11/14/2010) to evaluate recurrent chest pain/arm pain and external Adenosine stress nuclear positive for septal ischemia.  Catheterization demonstrated patent stents with no change in coronary anatomy from previous study of 06/23/2010.    . Essential hypertension 12/20/2016  . Gout 09/28/2011  . History of alcohol  abuse 09/28/2011  . History of nephrolithiasis 09/28/2011  . History of rheumatic fever 09/28/2011  . History of tobacco abuse 09/28/2011  . Hyperlipidemia 01/14/2017  . Hypertension 09/28/2011  . Mixed dyslipidemia 12/20/2016  . Multinodular thyroid 09/28/2011  . Near syncope 12/20/2016  . Pacemaker reprogramming/check 05/21/2015   Overview:  Dual chamber 12/24/14  . Peptic ulcer disease 09/28/2011  . Presence of stent in coronary artery in patient with coronary artery disease 12/20/2016  . Prostate nodule 11/21/2014  . Sinus node dysfunction ([place]) 12/24/2014    Patient Active Problem List   Diagnosis Date Noted  . Stroke ([place]) 08/20/2018  . Acquired hypothyroidism 12/20/2016  . Hypertensive heart disease with heart failure ([place]) 12/20/2016  . Mixed dyslipidemia 12/20/2016  . Near syncope 12/20/2016  . Presence of stent in coronary artery in patient with coronary artery disease 12/20/2016  . Chronic combined systolic and diastolic heart failure ([place]) 05/21/2015  . Pacemaker 05/21/2015  . Sinus node dysfunction ([place]) 12/24/2014  . Benign nodular prostatic hyperplasia 11/21/2014  . Nocturia 11/21/2014  . Prostate nodule 11/21/2014  . Carotid bruit 09/29/2011  . Coronary artery disease involving native coronary artery of native heart with angina pectoris ([place]) 09/28/2011  . Chronic renal insufficiency 09/28/2011  . History of nephrolithiasis 09/28/2011  . History of rheumatic fever 09/28/2011  . Hyperlipidemia 09/28/2011  . Multinodular thyroid 09/28/2011  . Peptic ulcer disease 09/28/2011  . History of alcohol abuse 09/28/2011  . History of tobacco abuse 09/28/2011  . Gout 09/28/2011  . ANEMIA 02/21/2010    Past Surgical History:  Procedure Laterality  Date  . CORONARY ANGIOPLASTY    . PACEMAKER IMPLANT    . PENILE PROSTHESIS IMPLANT    . THYROID SURGERY          Home Medications    Prior to Admission medications   Medication Sig Start Date End Date Taking? Authorizing  Provider  amLODipine (NORVASC) 10 MG tablet Take 0.5 tablets (5 mg total) by mouth daily. Do not take if systolic BP , 573 08/26/23   Munley, Hilton Cork, MD  amLODipine (NORVASC) 5 MG tablet TAKE 1 TABLET BY MOUTH EVERY MORNING, TAKE A 2ND TABLET IN THE EVENING ON MONDAY,WEDNESDAY AND FRIDAY 02/09/18   Richardo Priest, MD  aspirin 81 MG tablet Take 81 mg by mouth daily.    [provider]  BYSTOLIC 5 MG tablet TAKE ONE-HALF TABLET ONCE DAILY 06/06/18   Richardo Priest, MD  clopidogrel (PLAVIX) 75 MG tablet TAKE ONE (1) TABLET ONCE DAILY 06/24/18   Richardo Priest, MD  furosemide (LASIX) 40 MG tablet TAKE 1 TABLET DAILY AS NEEDED FOR SYSTOLIC > 427 OR EDEMA 0/62/37   Richardo Priest, MD  levothyroxine (SYNTHROID, LEVOTHROID) 88 MCG tablet Take 88 mcg by mouth daily.    [provider]  potassium chloride SA (K-DUR,KLOR-CON) 20 MEQ tablet TAKE 1/2 TABLET DAILY 02/09/18   Richardo Priest, MD  pravastatin (PRAVACHOL) 20 MG tablet Take 1 tablet (20 mg total) every other day by mouth. 05/19/17 08/17/17  Richardo Priest, MD  tamsulosin (FLOMAX) 0.4 MG CAPS capsule Take 0.4 mg by mouth daily. 12/11/15   [provider]    Family History Family History  Problem Relation Age of Onset  . CAD Father     Social History Social History   Tobacco Use  . Smoking status: Former Smoker    Types: Cigarettes  . Smokeless tobacco: Never Used  Substance Use Topics  . Alcohol use: No  . Drug use: No     Allergies   Lorazepam and Crestor [rosuvastatin calcium]   Review of Systems Review of Systems  Unable to perform ROS: Acuity of condition     Physical Exam Updated Vital Signs Temp 98.2 F (36.8 C) (Oral)   Ht 5\' 7"  (1.702 m)   Wt 69.6 kg   SpO2 96%   BMI 24.03 kg/m   Physical Exam Vitals signs and nursing note reviewed.  Constitutional:      Appearance: He is well-developed.  HENT:     Head: Normocephalic and atraumatic.  Neck:     Musculoskeletal: Normal range of  motion.  Cardiovascular:     Rate and Rhythm: Normal rate and regular rhythm.     Heart sounds: Normal heart sounds.  Pulmonary:     Effort: Pulmonary effort is normal. No respiratory distress.     Breath sounds: Normal breath sounds.  Abdominal:     General: There is no distension.     Palpations: Abdomen is soft.     Tenderness: There is no abdominal tenderness.  Musculoskeletal: Normal range of motion.  Skin:    General: Skin is warm and dry.  Neurological:     Mental Status: He is alert.     Comments: Ataxia of the left upper and left lower extremity.  No facial droop.  No dysarthric speech.  Psychiatric:        Judgment: Judgment normal.      ED Treatments / Results  Labs (all labs ordered are listed, but only abnormal results are displayed)  Labs Reviewed  CBC - Abnormal; Notable for the following components:      Result Value   Hemoglobin 11.7 (*)    HCT 36.9 (*)    All other components within normal limits  COMPREHENSIVE METABOLIC PANEL - Abnormal; Notable for the following components:   Glucose, Bld 103 (*)    Creatinine, Ser 1.69 (*)    GFR calc non Af Amer 35 (*)    GFR calc Af Amer 41 (*)    All other components within normal limits  CBG MONITORING, ED - Abnormal; Notable for the following components:   Glucose-Capillary 108 (*)    All other components within normal limits  I-STAT CREATININE, ED - Abnormal; Notable for the following components:   Creatinine, Ser 1.70 (*)    All other components within normal limits  PROTIME-INR  APTT  DIFFERENTIAL    EKG None  Radiology Ct Head Code Stroke Wo Contrast  Result Date: 08/20/2018 CLINICAL DATA:  Code stroke.  Bilateral leg weakness. EXAM: CT HEAD WITHOUT CONTRAST TECHNIQUE: Contiguous axial images were obtained from the base of the skull through the vertex without intravenous contrast. COMPARISON:  09/25/2012 FINDINGS: Brain: There is no evidence of acute infarct, intracranial hemorrhage, mass, midline  shift, or extra-axial fluid collection. Generalized cerebral atrophy is not greater than expected for age. Cerebral white matter hypodensities are similar to the prior study and nonspecific but compatible with mild chronic small vessel ischemic disease. Vascular: Calcified atherosclerosis at the skull base. No hyperdense vessel. Skull: No fracture or focal osseous lesion. Sinuses/Orbits: Visualized paranasal sinuses and mastoid air cells are clear. Bilateral cataract extraction is noted. Other: None. ASPECTS [facility] Stroke Program Early CT Score) - Ganglionic level infarction (caudate, lentiform nuclei, internal capsule, insula, M1-M3 cortex): 7 - Supraganglionic infarction (M4-M6 cortex): 3 Total score (0-10 with 10 being normal): 10 IMPRESSION: 1. No evidence of acute intracranial abnormality. 2. ASPECTS is 10. 3. Mild chronic small vessel ischemic disease. These results were communicated to Dr. Lorraine Lax at 4:04 pm on 08/20/2018 by text page via the [facility] messaging system. Electronically Signed   By: Logan Bores M.D.   On: 08/20/2018 16:04    Procedures .Critical Care Performed by: Jola Schmidt, MD Authorized by: Jola Schmidt, MD   Critical care provider statement:    Critical care time (minutes):  42   Critical care was time spent personally by me on the following activities:  Discussions with consultants, evaluation of patient's response to treatment, examination of patient, ordering and performing treatments and interventions, ordering and review of laboratory studies, ordering and review of radiographic studies, pulse oximetry, re-evaluation of patient's condition, obtaining history from patient or surrogate and review of old charts   (including critical care time)  Medications Ordered in ED Medications  sodium chloride flush (NS) 0.9 % injection 3 mL (has no administration in time range)  labetalol (NORMODYNE,TRANDATE) injection 10 mg (has no administration in time range)  alteplase  (ACTIVASE) 1 mg/mL infusion 62.6 mg (has no administration in time range)   stroke: mapping our early stages of recovery book (has no administration in time range)  0.9 %  sodium chloride infusion (has no administration in time range)  acetaminophen (TYLENOL) tablet 650 mg (has no administration in time range)    Or  acetaminophen (TYLENOL) solution 650 mg (has no administration in time range)    Or  acetaminophen (TYLENOL) suppository 650 mg (has no administration in time range)  senna-docusate (Senokot-S) tablet 1 tablet (has no administration  in time range)  pantoprazole (PROTONIX) injection 40 mg (has no administration in time range)  clevidipine (CLEVIPREX) infusion 0.5 mg/mL (has no administration in time range)     Initial Impression / Assessment and Plan / ED Course  I have reviewed the triage vital signs and the nursing notes.  Pertinent labs & imaging results that were available during my care of the patient were reviewed by me and considered in my medical decision making (see chart for details).     Patient with ataxia of his left upper and left lower extremity.  Decision to give TPA by neurology.  Please see consultation note for complete details.  I had discussion with the patient as well.  He is aware of the 4 to 6% risk of major bleed.  He is agreeable to TPA.  He will continue to be followed closely while in the emergency department and be admitted to the neuro intensive care unit.    Final Clinical Impressions(s) / ED Diagnoses   Final diagnoses:  Cerebrovascular accident (CVA), unspecified mechanism [facility]    ED Discharge Orders    None       Jola Schmidt, MD 08/20/18 [id number]

## 2018-08-21 ENCOUNTER — Inpatient Hospital Stay (HOSPITAL_COMMUNITY): Payer: Medicare Other

## 2018-08-21 DIAGNOSIS — I6300 Cerebral infarction due to thrombosis of unspecified precerebral artery: Secondary | ICD-10-CM

## 2018-08-21 DIAGNOSIS — I371 Nonrheumatic pulmonary valve insufficiency: Secondary | ICD-10-CM

## 2018-08-21 DIAGNOSIS — I340 Nonrheumatic mitral (valve) insufficiency: Secondary | ICD-10-CM

## 2018-08-21 LAB — ECHOCARDIOGRAM COMPLETE
Height: 67 in
Weight: 2455.04 oz

## 2018-08-21 LAB — LIPID PANEL
Cholesterol: 191 mg/dL (ref 0–200)
HDL: 58 mg/dL (ref >40)
LDL Cholesterol: 116 mg/dL — ABNORMAL HIGH (ref 0–99)
TRIGLYCERIDES: 85 mg/dL (ref <150)
Total CHOL/HDL Ratio: 3.3 RATIO
VLDL: 17 mg/dL (ref 0–40)

## 2018-08-21 MED ORDER — ASPIRIN 325 MG PO TABS
325.0000 mg | ORAL_TABLET | Freq: Every day | ORAL | Status: DC
  Administered 2018-08-21 – 2018-08-22 (×2): 325 mg via ORAL
  Filled 2018-08-21 (×2): qty 1

## 2018-08-21 MED ORDER — PERFLUTREN LIPID MICROSPHERE
INTRAVENOUS | Status: AC
  Administered 2018-08-21: 2 mL via INTRAVENOUS
  Filled 2018-08-21: qty 10

## 2018-08-21 MED ORDER — PERFLUTREN LIPID MICROSPHERE
1.0000 mL | INTRAVENOUS | Status: AC | PRN
  Administered 2018-08-21: 2 mL via INTRAVENOUS
  Filled 2018-08-21: qty 10

## 2018-08-21 MED ORDER — PANTOPRAZOLE SODIUM 40 MG PO TBEC
40.0000 mg | DELAYED_RELEASE_TABLET | Freq: Every day | ORAL | Status: DC
  Administered 2018-08-21: 40 mg via ORAL
  Filled 2018-08-21: qty 1

## 2018-08-21 MED ORDER — ATORVASTATIN CALCIUM 10 MG PO TABS
20.0000 mg | ORAL_TABLET | Freq: Every day | ORAL | Status: DC
  Administered 2018-08-21: 20 mg via ORAL
  Filled 2018-08-21: qty 2

## 2018-08-21 NOTE — Plan of Care (Signed)
Pt ambulated in room, minimal assist required

## 2018-08-21 NOTE — Plan of Care (Signed)
  Problem: Nutrition: Goal: Adequate nutrition will be maintained Outcome: Progressing   

## 2018-08-21 NOTE — Progress Notes (Signed)
  Echocardiogram 2D Echocardiogram has been performed.  Anthony Jensen 08/21/2018, 11:57 AM

## 2018-08-21 NOTE — Progress Notes (Signed)
STROKE TEAM PROGRESS NOTE   HISTORY OF PRESENT ILLNESS (per record) Anthony Reasons. is an 85 y.o. male  With PMH HLD, HTN, CAD, chronic combined HF , implanted pacemaker, who presented to [facility] ED as a code stroke for c/o gait instability and bilateral leg weakness.   No prior stroke history. Patient was completely normal. Was out driving and was with his daughter. At 1445 had a sudden onset of unsteady gait and bilateral leg weakness. Dr. Rhona Raider states that he has had no other symptoms. Denies CP, SOB, vision changes, arm weakness. Patient is on ASA and plavix, no anticoagulation.    ED course:  CTH: no hemorrhage.  Creatinine: 1.70  69.9kg Date last known well: 08-20-2018 Time last known well: 1445 tPA Given: Yes; 1608 Modified Rankin: Rankin Score=0 NIHSS:4; left ataxia arm/leg, left leg drift, facial droop   SUBJECTIVE (INTERVAL HISTORY) Multiple family members present. Dr Leonie Man discussed the patients case and answered questions. Pt appears back to baseline.    OBJECTIVE Vitals:   08/21/18 1000 08/21/18 1100 08/21/18 1200 08/21/18 1300  BP: (!) 142/63 (!) 144/68 (!) 165/79 (!) 146/111  Pulse: 69 69 70 71  Resp: 16 19 17 20   Temp:   98.1 F (36.7 C)   TempSrc:   Oral   SpO2: 97% 95% 94% 98%  Weight:      Height:        CBC:  Recent Labs  Lab 08/20/18 1551  WBC 4.5  NEUTROABS 2.6  HGB 11.7*  HCT 36.9*  MCV 85.4  PLT 254    Basic Metabolic Panel:  Recent Labs  Lab 08/20/18 1551 08/20/18 1600  NA 136  --   K 4.3  --   CL 100  --   CO2 24  --   GLUCOSE 103*  --   BUN 16  --   CREATININE 1.69* 1.70*  CALCIUM 9.3  --     Lipid Panel:     Component Value Date/Time   CHOL 191 08/21/2018 0419   CHOL 215 (H) 06/20/2018 1645   TRIG 85 08/21/2018 0419   HDL 58 08/21/2018 0419   HDL 70 06/20/2018 1645   CHOLHDL 3.3 08/21/2018 0419   VLDL 17 08/21/2018 0419   LDLCALC 116 (H) 08/21/2018 0419   LDLCALC 117 (H) 06/20/2018 1645   HgbA1c: No  results found for: HGBA1C Urine Drug Screen: No results found for: LABOPIA, COCAINSCRNUR, LABBENZ, AMPHETMU, THCU, LABBARB  Alcohol Level No results found for: [place] Head Code Stroke Wo Contrast 08/20/2018 IMPRESSION:  1. No evidence of acute intracranial abnormality.  2. ASPECTS is 10.  3. Mild chronic small vessel ischemic disease.     Transthoracic Echocardiogram  08/21/2018  1. The left ventricle has severely reduced systolic function, with an ejection fraction of 25-30%. The cavity size was normal. Left ventricular diastolic Doppler parameters are consistent with impaired relaxation Elevated left ventricular end-diastolic  pressure.  2. There is akinesis of the apical septal, anterior, inferior and apical left ventricular segments.  3. There is akinesis of the mid anteroseptal and inferoseptal left ventricular segments.  4. The right ventricle has normal systolic function. The cavity was normal. There is no increase in right ventricular wall thickness.  5. The mitral valve is normal in structure.  6. The tricuspid valve is normal in structure.  7. The aortic valve is tricuspid Mild sclerosis of the aortic valve.  8. The pulmonic valve was normal in structure. Pulmonic  valve regurgitation is mild by color flow Doppler.  9. Right atrial pressure is estimated at 10 mmHg.    Bilateral Carotid Dopplers  00/00/2020 Pending     PHYSICAL EXAM Blood pressure (!) 146/111, pulse 71, temperature 98.1 F (36.7 C), temperature source Oral, resp. rate 20, height 5\' 7"  (1.702 m), weight 69.6 kg, SpO2 98 %. Pleasant frail elderly caucasian male not in distress. . Afebrile. Head is nontraumatic. Neck is supple without bruit.    Cardiac exam no murmur or gallop. Lungs are clear to auscultation. Distal pulses are well felt.  Neurological Exam :     Awake  Alert oriented x 3. Normal speech and language.eye movements full without nystagmus.fundi were not visualized. Vision  acuity and fields appear normal. Hearing is normal. Palatal movements are normal. Face symmetric. Tongue midline. Normal strength, tone, reflexes and coordination. Normal sensation. Gait deferred.       ASSESSMENT/PLAN Anthony Jensen. is a 85 y.o. male with history of HLD, HTN, CAD, chronic combined CHF , implanted pacemaker,  presenting with gait instability and bilateral leg weakness.   tPA Given: Yes; 1608  Stroke:  Imaging pending  Resultant  - back to baseline  CT head - No evidence of acute intracranial abnormality.   MRI head - ppm (see below)  MRA head - ppm  CTA H&N - creatinine 1.70  Carotid Doppler - pending  2D Echo  - EF 25 - 30%. No cardiac source of emboli identified.   LDL - 116  HgbA1c - pending  UDS - not performed  VTE prophylaxis - SCDs  Diet  - Heart healthy with thin liquids.  aspirin 325 mg daily and clopidogrel 75 mg daily prior to admission, now on No antithrombotic  Ongoing aggressive stroke risk factor management  Therapy recommendations:  pending  Disposition:  Pending  Hypertension  Blood pressure somewhat high at times but within post stroke parameters  Permissive hypertension (OK if < 220/120) but gradually normalize in 5-7 days  Need to avoid extremely high BP with low EF 25 - 30% . Long-term BP goal normotensive  Hyperlipidemia  Lipid lowering medication PTA:  Pravachol 20 mg daily  LDL 116, goal < 70  Current lipid lowering medication: add Lipitor 20 mg daily  (hx of intol to Crestor - myalgias)  Continue statin at discharge   Other Stroke Risk Factors  Advanced age  Former cigarette smoker - quit  CAD   Other Active Problems  EF 25 - 30 % - decrease IV fluid rate and check CXR  Renal insufficiency - creatinine 1.70 - plan was to hydrate pt for possible CTA H&N  PPM - [employer] rep interrogated PPM today -> no afib.   PLAN   If MRI is needed we can call MRI tech tomorrow and they will  contact [employer] rep; however, cardiology will also need to be involved.  Bmet in AM to check creatinine if CTA is needed.  Check CXR for hx of CHF with low EF. Decrease IV fluids to 50 cc/hr. Check BNP in am.  F/U Head CT to see if anti platelet therapy can be resumed (asa 325 mg daily and Plavix PTA)  Change Pravachol to Lipitor and monitor for myalgias.   Hospital day # 1  Mikey Bussing PA-C [facility] Pager [phone] 08/21/2018, 3:34 PM I have personally obtained history,examined this patient, reviewed notes, independently viewed imaging studies, participated in medical decision making and plan of care.ROS completed by me  personally and pertinent positives fully documented  I have made any additions or clarifications directly to the above note. Agree with note above. He presented with sudden onset of gait ataxia and left-sided ataxia possibly due to small brainstem or posterior circulation infarct and received IV tPA and seems to have made significant improvement. Recommend close neurological monitoring and strict blood pressure control as per post TPA protocol. Check repeat CT scan unless MRI can be obtained if he has MRI compatible pacemaker. Long discussion with patient and multiple family members at the bedside and answered questions. Continue ongoing stroke workup.This patient is critically ill and at significant risk of neurological worsening, death and care requires constant monitoring of vital signs, hemodynamics,respiratory and cardiac monitoring, extensive review of multiple databases, frequent neurological assessment, discussion with family, other specialists and medical decision making of high complexity.I have made any additions or clarifications directly to the above note.This critical care time does not reflect procedure time, or teaching time or supervisory time of PA/NP/Med Resident etc but could involve care discussion time.  I spent 30 minutes of  neurocritical care time  in the care of  this patient.      Antony Contras, MD Medical Director [facility] Pager: [phone] 08/21/2018 4:03 PM   To contact Stroke Continuity provider, please refer to [email or web address] After hours, contact General Neurology

## 2018-08-21 NOTE — Progress Notes (Signed)
PT Cancellation Note  Patient Details Name: Anthony Jensen. MRN: [id number] DOB: 1926-04-10   Cancelled Treatment:    Reason Eval/Treat Not Completed: Active bedrest order. Pt received tPA at 16:08 2/16 and remains on bedrest per 24hr protocol. PT to return as able, as appropriate to complete PT evaluation.  Kittie Plater, PT, DPT Acute Rehabilitation Services Pager #: [phone] Office #: [phone]    Berline Lopes 08/21/2018, 7:23 AM

## 2018-08-21 NOTE — Progress Notes (Signed)
Per Dr. Lorraine Lax, pt is ok to wait for MR scan tomorrow 08/22/18. Dave Rinehuls PA also aware.

## 2018-08-22 ENCOUNTER — Other Ambulatory Visit: Payer: Self-pay | Admitting: Cardiology

## 2018-08-22 ENCOUNTER — Inpatient Hospital Stay (HOSPITAL_COMMUNITY): Payer: Medicare Other

## 2018-08-22 DIAGNOSIS — I6300 Cerebral infarction due to thrombosis of unspecified precerebral artery: Secondary | ICD-10-CM

## 2018-08-22 LAB — BASIC METABOLIC PANEL
Anion gap: 9 (ref 5–15)
BUN: 13 mg/dL (ref 8–23)
CALCIUM: 8.9 mg/dL (ref 8.9–10.3)
CO2: 22 mmol/L (ref 22–32)
Chloride: 106 mmol/L (ref 98–111)
Creatinine, Ser: 1.5 mg/dL — ABNORMAL HIGH (ref 0.61–1.24)
GFR calc Af Amer: 47 mL/min — ABNORMAL LOW (ref >60)
GFR calc non Af Amer: 40 mL/min — ABNORMAL LOW (ref >60)
Glucose, Bld: 123 mg/dL — ABNORMAL HIGH (ref 70–99)
Potassium: 4.1 mmol/L (ref 3.5–5.1)
Sodium: 137 mmol/L (ref 135–145)

## 2018-08-22 LAB — BRAIN NATRIURETIC PEPTIDE: B Natriuretic Peptide: 700.7 pg/mL — ABNORMAL HIGH (ref 0.0–100.0)

## 2018-08-22 MED ORDER — FUROSEMIDE 40 MG PO TABS: 40.0000 mg | ORAL_TABLET | Freq: Every day | ORAL | Status: DC

## 2018-08-22 MED ORDER — NEBIVOLOL HCL 5 MG PO TABS: 1.6700 mg | ORAL_TABLET | Freq: Every day | ORAL | Status: DC

## 2018-08-22 MED ORDER — ATORVASTATIN CALCIUM 20 MG PO TABS: 20.0000 mg | ORAL_TABLET | Freq: Every day | ORAL | 2 refills | Status: DC

## 2018-08-22 MED ORDER — AMLODIPINE BESYLATE 5 MG PO TABS: 2.5000 mg | ORAL_TABLET | ORAL | Status: DC

## 2018-08-22 MED ORDER — ASPIRIN 325 MG PO TABS: 325.0000 mg | ORAL_TABLET | Freq: Every day | ORAL | Status: DC

## 2018-08-22 MED ORDER — POTASSIUM CHLORIDE CRYS ER 20 MEQ PO TBCR: 10.0000 meq | EXTENDED_RELEASE_TABLET | Freq: Every day | ORAL | Status: DC

## 2018-08-22 MED ORDER — CLOPIDOGREL BISULFATE 75 MG PO TABS: 75.0000 mg | ORAL_TABLET | Freq: Every day | ORAL | Status: DC

## 2018-08-22 NOTE — Telephone Encounter (Signed)
Sent message to Dr. Bettina Gavia to verify with him on how the patient is taking medication.

## 2018-08-22 NOTE — Evaluation (Signed)
Occupational Therapy Evaluation Patient Details Name: Anthony Jensen [facility]. MRN: [id number] DOB: 19-Nov-1925 Today's Date: 08/22/2018    History of Present Illness Tylin Force. is an 85 y.o. male  With PMH HLD, HTN, CAD, chronic combined HF , implanted pacemaker, who presented to [facility] ED as a code stroke for c/o gait instability and bilateral leg weakness. Pt received tPA at 14:08 on 2/15. CT negative. Awaiting MRI, limited due to pacemaker.   Clinical Impression   Patient admitted for above and is functioning at baseline for self care and functional mobility, cognitively appears at baseline (slightly impulsive, but anticipate baseline).  Patient reports he will have 24/7 support at discharge from caregiver.   Discussed home safety and fall prevention, agreeable to chair for shower and to bathe seated with supervision.  Based on performance today, no further OT needs have been identified and OT signing off.     Follow Up Recommendations  No OT follow up;Supervision/Assistance - 24 hour    Equipment Recommendations  Tub/shower seat    Recommendations for Other Services       Precautions / Restrictions Precautions Precautions: Fall Restrictions Weight Bearing Restrictions: No      Mobility Bed Mobility Overal bed mobility: Modified Independent             General bed mobility comments: seated OOB upon entry   Transfers Overall transfer level: Needs assistance Equipment used: None Transfers: Sit to/from Stand Sit to Stand: Supervision         General transfer comment: supervision for safety     Balance Overall balance assessment: Needs assistance Sitting-balance support: Feet supported;No upper extremity supported Sitting balance-Leahy Scale: Fair     Standing balance support: No upper extremity supported;During functional activity Standing balance-Leahy Scale: Fair Standing balance comment: supervision for safety, no assist required                            ADL either performed or assessed with clinical judgement   ADL Overall ADL's : Modified independent;At baseline                                       General ADL Comments: modified independent level for toilet transfers, grooming and LB dressing; simulated shower transfers modified independent and reviewed safety with bathing seated in shower and having supervision      Vision Baseline Vision/History: Wears glasses Wears Glasses: At all times Patient Visual Report: No change from baseline Vision Assessment?: No apparent visual deficits     Perception     Praxis      Pertinent Vitals/Pain Pain Assessment: No/denies pain     Hand Dominance Right   Extremity/Trunk Assessment Upper Extremity Assessment Upper Extremity Assessment: Overall WFL for tasks assessed   Lower Extremity Assessment Lower Extremity Assessment: Defer to PT evaluation   Cervical / Trunk Assessment Cervical / Trunk Assessment: Normal   Communication Communication Communication: No difficulties   Cognition Arousal/Alertness: Awake/alert Behavior During Therapy: WFL for tasks assessed/performed Overall Cognitive Status: Within Functional Limits for tasks assessed                                 General Comments: slightly impulsive, believe it's his nature/baseline   General Comments  VSS. Pt urinated and washed hands without assist  Exercises     Shoulder Instructions      Home Living Family/patient expects to be discharged to:: Private residence Living Arrangements: Alone Available Help at Discharge: Personal care attendant;Available 24 hours/day Type of Home: House Home Access: Level entry     Home Layout: One level     Bathroom Shower/Tub: Occupational psychologist: Handicapped height Bathroom Accessibility: Yes   Home Equipment: [place] - single point;Shower seat - built in;Grab bars - toilet;Grab bars - tub/shower           Prior Functioning/Environment Level of Independence: Independent        Comments: drives, caregiver assist with IADls         OT Problem List: Impaired balance (sitting and/or standing);Decreased activity tolerance;Decreased knowledge of use of DME or AE;Decreased safety awareness      OT Treatment/Interventions:      OT Goals(Current goals can be found in the care plan section) Acute Rehab OT Goals Patient Stated Goal: home OT Goal Formulation: With patient  OT Frequency:     Barriers to D/C:            Co-evaluation              AM-PAC OT "6 Clicks" Daily Activity     Outcome Measure Help from another person eating meals?: None Help from another person taking care of personal grooming?: None Help from another person toileting, which includes using toliet, bedpan, or urinal?: None Help from another person bathing (including washing, rinsing, drying)?: None Help from another person to put on and taking off regular upper body clothing?: None Help from another person to put on and taking off regular lower body clothing?: None 6 Click Score: 24   End of Session Nurse Communication: Mobility status  Activity Tolerance: Patient tolerated treatment well Patient left: in chair;with call bell/phone within reach;with chair alarm set  OT Visit Diagnosis: Unsteadiness on feet (R26.81)                Time: 2197-5883 OT Time Calculation (min): 9 min Charges:  OT General Charges $OT Visit: 1 Visit OT Evaluation $OT Eval Low Complexity: 1 Low  Delight Stare, OT Acute Rehabilitation Services Pager [phone] Office [phone]   Delight Stare 08/22/2018, 10:20 AM

## 2018-08-22 NOTE — Telephone Encounter (Signed)
° ° ° °*  STAT* If patient is at the pharmacy, call can be transferred to refill team.   1. Which medications need to be refilled? (please list name of each medication and dose if known) nebivolol (BYSTOLIC) 5 MG tablet  2. Which pharmacy/location (including street and city if local pharmacy) is medication to be sent to? [place], [place]  3. Do they need a 30 day or 90 day supply? [place]

## 2018-08-22 NOTE — Discharge Summary (Addendum)
Stroke Discharge Summary  Patient ID: Anthony Jensen   MRN: [id number]      DOB: Feb 07, 1926  Date of Admission: 08/20/2018 Date of Discharge: 08/22/2018  Attending Physician:  Garvin Fila, MD, Stroke MD Consultant(s):    None  Patient's PCP:  [name], MD  DISCHARGE DIAGNOSIS:  Principal Problem:   Stroke [facility]) -due to small vessel disease s/p TPA Active Problems:   Chronic combined systolic and diastolic heart failure ([place])   Chronic renal insufficiency   Hypertensive heart disease with heart failure ([place])   Hyperlipidemia   Pacemaker   History of alcohol abuse   History of tobacco abuse   Past Medical History:  Diagnosis Date  . Acquired hypothyroidism 12/20/2016  . ANEMIA 02/21/2010   Qualifier: Diagnosis of  By: Nelson-Smith CMA (AAMA), Dottie    . Benign nodular prostatic hyperplasia 11/21/2014  . Carotid bruit 09/29/2011  . Chronic combined systolic and diastolic heart failure ([employer]) 05/21/2015  . Chronic renal insufficiency 09/28/2011  . Coronary artery disease 09/28/2011   Overview:  a.  Exertional chest discomfort/arm pain with dyspnea.    b.  Bare Metal Stent to proximal left circumflex, (09/1998).    c.  Cypher Stent to mid LAD, (06/03/2005).  d.  MRI, (06/10/2010):  demonstrating normal LVEF with evidence of mild--moderate infarct of inferolateral subendocardial region with peri-infarct ischemia in the left circumflex distribution.    e.  Status post cardiac catheterization (06/23/2010) with drug-eluting stent placed at 60-80% lesion in mid-RCA.    f.  Status post cardiac cath (11/14/2010) to evaluate recurrent chest pain/arm pain and external Adenosine stress nuclear positive for septal ischemia.  Catheterization demonstrated patent stents with no change in coronary anatomy from previous study of 06/23/2010.    . Essential hypertension 12/20/2016  . Gout 09/28/2011  . History of alcohol abuse 09/28/2011  . History of nephrolithiasis 09/28/2011  .  History of rheumatic fever 09/28/2011  . History of tobacco abuse 09/28/2011  . Hyperlipidemia 01/14/2017  . Hypertension 09/28/2011  . Mixed dyslipidemia 12/20/2016  . Multinodular thyroid 09/28/2011  . Near syncope 12/20/2016  . Pacemaker reprogramming/check 05/21/2015   Overview:  Dual chamber 12/24/14  . Peptic ulcer disease 09/28/2011  . Presence of stent in coronary artery in patient with coronary artery disease 12/20/2016  . Prostate nodule 11/21/2014  . Sinus node dysfunction ([place]) 12/24/2014   Past Surgical History:  Procedure Laterality Date  . CORONARY ANGIOPLASTY    . PACEMAKER IMPLANT    . PENILE PROSTHESIS IMPLANT    . THYROID SURGERY      Allergies as of 08/22/2018      Reactions   Lorazepam Other (See Comments)   Made him crazy   Crestor [rosuvastatin Calcium] Other (See Comments)   myalgias      Medication List    STOP taking these medications   aspirin EC 325 MG tablet Replaced by:  aspirin 325 MG tablet   pravastatin 20 MG tablet Commonly known as:  PRAVACHOL     TAKE these medications   amLODipine 5 MG tablet Commonly known as:  NORVASC Take 0.5-1 tablets (2.5-5 mg total) by mouth See admin instructions. Take 1/2 tablet (2.5 mg) by mouth daily if BP at least 140/60, take 1 tablet (5 mg) if BP 160/60 or higher. What changed:    See the new instructions.  Another medication with the same name was removed. Continue taking this medication, and follow the directions you  see here.   aspirin 325 MG tablet Take 1 tablet (325 mg total) by mouth daily. Start taking on:  August 23, 2018 Replaces:  aspirin EC 325 MG tablet   atorvastatin 20 MG tablet Commonly known as:  LIPITOR Take 1 tablet (20 mg total) by mouth daily at 6 PM.   clopidogrel 75 MG tablet Commonly known as:  PLAVIX Take 1 tablet (75 mg total) by mouth daily. What changed:  See the new instructions.   furosemide 40 MG tablet Commonly known as:  LASIX Take 1 tablet (40 mg total) by mouth  daily. What changed:  See the new instructions.   levothyroxine 125 MCG tablet Commonly known as:  SYNTHROID, LEVOTHROID Take 125 mcg by mouth daily.   nebivolol 5 MG tablet Commonly known as:  BYSTOLIC Take 0.5 tablets (2.5 mg total) by mouth daily. 1/3 tablet - 1.67 mg What changed:  See the new instructions.   potassium chloride SA 20 MEQ tablet Commonly known as:  K-DUR,KLOR-CON Take 0.5 tablets (10 mEq total) by mouth daily.       LABORATORY STUDIES CBC    Component Value Date/Time   WBC 4.5 08/20/2018 1551   RBC 4.32 08/20/2018 1551   HGB 11.7 (L) 08/20/2018 1551   HGB 12.3 (L) 09/18/2011 0854   HCT 36.9 (L) 08/20/2018 1551   HCT 36.6 (L) 09/18/2011 0854   PLT 205 08/20/2018 1551   PLT 175 09/18/2011 0854   MCV 85.4 08/20/2018 1551   MCV 88.8 09/18/2011 0854   MCH 27.1 08/20/2018 1551   MCHC 31.7 08/20/2018 1551   RDW 13.7 08/20/2018 1551   RDW 16.1 (H) 09/18/2011 0854   LYMPHSABS 1.2 08/20/2018 1551   LYMPHSABS 1.0 09/18/2011 0854   MONOABS 0.4 08/20/2018 1551   MONOABS 0.5 09/18/2011 0854   EOSABS 0.3 08/20/2018 1551   EOSABS 0.3 09/18/2011 0854   BASOSABS 0.0 08/20/2018 1551   BASOSABS 0.1 09/18/2011 0854   CMP    Component Value Date/Time   NA 137 08/22/2018 0915   NA 140 06/20/2018 1645   K 4.1 08/22/2018 0915   CL 106 08/22/2018 0915   CO2 22 08/22/2018 0915   GLUCOSE 123 (H) 08/22/2018 0915   BUN 13 08/22/2018 0915   BUN 24 06/20/2018 1645   CREATININE 1.50 (H) 08/22/2018 0915   CALCIUM 8.9 08/22/2018 0915   PROT 6.8 08/20/2018 1551   PROT 7.0 05/17/2017 1035   ALBUMIN 3.7 08/20/2018 1551   ALBUMIN 4.4 05/17/2017 1035   AST 28 08/20/2018 1551   ALT 14 08/20/2018 1551   ALKPHOS 69 08/20/2018 1551   BILITOT 0.8 08/20/2018 1551   BILITOT 0.4 05/17/2017 1035   GFRNONAA 40 (L) 08/22/2018 0915   GFRAA 47 (L) 08/22/2018 0915   COAGS Lab Results  Component Value Date   INR 1.02 08/20/2018   Lipid Panel    Component Value Date/Time    CHOL 191 08/21/2018 0419   CHOL 215 (H) 06/20/2018 1645   TRIG 85 08/21/2018 0419   HDL 58 08/21/2018 0419   HDL 70 06/20/2018 1645   CHOLHDL 3.3 08/21/2018 0419   VLDL 17 08/21/2018 0419   LDLCALC 116 (H) 08/21/2018 0419   LDLCALC 117 (H) 06/20/2018 1645   HgbA1C No results found for: HGBA1C   SIGNIFICANT DIAGNOSTIC STUDIES Ct Head Code Stroke Wo Contrast 08/20/2018 1. No evidence of acute intracranial abnormality.  2. ASPECTS is 10.  3. Mild chronic small vessel ischemic disease.   Ct Head Code Stroke  Wo Contrast 08/21/2018 1. No evidence of acute intracranial abnormality. 2. Mild chronic small vessel ischemic disease.  Transthoracic Echocardiogram  08/21/2018 1. The left ventricle has severely reduced systolic function, with an ejection fraction of 25-30%. The cavity size was normal. Left ventricular diastolic Doppler parameters are consistent with impaired relaxation Elevated left ventricular end-diastolic pressure. 2. There is akinesis of the apical septal, anterior, inferior and apical left ventricular segments. 3. There is akinesis of the mid anteroseptal and inferoseptal left ventricular segments. 4. The right ventricle has normal systolic function. The cavity was normal. There is no increase in right ventricular wall thickness. 5. The mitral valve is normal in structure. 6. The tricuspid valve is normal in structure. 7. The aortic valve is tricuspid Mild sclerosis of the aortic valve. 8. The pulmonic valve was normal in structure. Pulmonic valve regurgitation is mild by color flow Doppler. 9. Right atrial pressure is estimated at 10 mmHg.  Bilateral Carotid Dopplers  Right Carotid: Velocities in the right ICA are consistent with a 1-39% stenosis.  Left Carotid: Velocities in the left ICA are consistent with a 1-39% stenosis.     HISTORY OF PRESENT ILLNESS Anthony Jensenis an 85 y.o.maleWith PMH HLD, HTN, CAD, chronic combined HF, implanted  pacemaker,who presented to [facility] ED as a code stroke for c/o gait instability and bilateral leg weakness. No prior stroke history. Patient was completely normal. Was out driving and was with his daughter. At 1445 on 08/20/2018 ([facility]KW) had a sudden onset of unsteady gait and bilateral leg weakness. Dr. Rhona Raider states that he has had no other symptoms. Denies CP, SOB, vision changes, arm weakness. Patient is on ASA and plavix, no anticoagulation. CTH: no hemorrhage.Creatinine: 1.70. IV tPA Given. Modified Rankin:Rankin Score=0. NIHSS:4; left ataxia arm/leg, left leg drift, facial droop. Admitted to the neuro ICU.     HOSPITAL COURSE [name]. is a 85 y.o. male with history of HLD, HTN, CAD, chronic combined CHF, implanted pacemaker,  presenting with gait instability and bilateral leg weakness.   tPA Given:Yes; 9563  Probable small stroke not seen on imaging s/p IV tPA - d/t Small vessel disease  CT head - No evidence of acute intracranial abnormality.   MRI head - ppm (see below)  MRA head - ppm  CTA H&N - not done d/t creatinine 1.70. given SVD etiology, will not pursue further vascular imaging   Repeat CT head at 24h - No evidence of acute intracranial abnormality.   Carotid Doppler - B ICA 1-39% stenosis, VAs antegrade   2D Echo  - EF 25 - 30%. No cardiac source of emboli identified.   LDL - 116  HgbA1c - pending  UDS - not performed  aspirin 650 mg daily and clopidogrel 75 mg daily prior to admission, resumed post-tPA when 24h imaging neg for hmg  Therapy recommendations:  HH PT  Disposition:  home w/ family  Hypertension  Home meds: norvasc, bysytolic, lasix  Blood pressure somewhat high in hospital  Need to avoid extremely high BP with low EF 25 - 30%  Hyperlipidemia  Lipid lowering medication PTA:  Pravachol 20 mg daily  LDL 116, goal < 70  Current lipid lowering medication: changed to Lipitor 20 mg daily  (hx of intol to Crestor -  myalgias)  Continue statin at discharge  Other Stroke Risk Factors  Advanced age  Former cigarette smoker - quit  History of alcohol abuse  Coronary artery disease of native artery of native heart  with stable angina pectoris (HCC)   Hx Chronic combined systolic and diastolic heart failure w/ pacer. CXR  Stable CM w/o pulm edema. Stable.  Other Active Problems  Renal insufficiency - creatinine 1.70 ->1.5 plan was to hydrate pt for possible CTA H&N  PPM - [employer] rep interrogated PPM today -> no afib.   DISCHARGE EXAM Blood pressure (!) 159/75, pulse 76, temperature (!) 97 F (36.1 C), temperature source Oral, resp. rate 18, height 5\' 7"  (1.702 m), weight 69.6 kg, SpO2 94 %. Pleasant frail elderly caucasian male not in distress. . Afebrile. Head is nontraumatic. Neck is supple without bruit.    Cardiac exam no murmur or gallop. Lungs are clear to auscultation. Distal pulses are well felt. Neurological Exam :  Awake  Alert oriented x 3. Normal speech and language.eye movements full without nystagmus.fundi were not visualized. Vision acuity and fields appear normal. Hearing is normal. Palatal movements are normal. Face symmetric. Tongue midline. Normal strength, tone, reflexes and coordination. Normal sensation. Gait deferred.  Discharge Diet   Heart healthy thin liquids  DISCHARGE PLAN  Disposition:  home  aspirin 325 mg daily and clopidogrel 75 mg daily for secondary stroke prevention. Continue as PTA  Ongoing risk factor control by Primary Care Physician at time of discharge  Follow-up [name], MD in 2 weeks.  Follow-up in [facility] in 4 weeks, office to schedule an appointment.   35 minutes were spent preparing discharge.  Burnetta Sabin, MSN, APRN, ANVP-BC, AGPCNP-BC Advanced Practice Stroke Nurse [place] for Schedule & Pager information 08/22/2018 12:48 PM  I have personally obtained  history,examined this patient, reviewed notes, independently viewed imaging studies, participated in medical decision making and plan of care.ROS completed by me personally and pertinent positives fully documented  I have made any additions or clarifications directly to the above note. Agree with note above.    Antony Contras, MD Medical Director [facility] Pager: [phone] 08/22/2018 3:29 PM

## 2018-08-22 NOTE — Progress Notes (Signed)
Peripheral IV's removed. Discharge instructions given to patient and his caregiver. Patient pushed I wheelchair out to pick up area.

## 2018-08-22 NOTE — Evaluation (Signed)
Physical Therapy Evaluation Patient Details Name: Anthony Jensen [facility] MRN: [id number] DOB: December 16, 1925 Today's Date: 08/22/2018   History of Present Illness  Anthony Coon. is an 85 y.o. male  With PMH HLD, HTN, CAD, chronic combined HF , implanted pacemaker, who presented to [facility] ED as a code stroke for c/o gait instability and bilateral leg weakness. Pt received tPA at 14:08 on 2/15. CT negative. Awaiting MRI, limited due to pacemaker.  Clinical Impression  Pt admitted with above. Pt functioning near baseline. Pt mildly impulsive and unsteady but suspect this is his baseline. Pt with wide base of support and decreased step height. Pt states he walks better in shoes and is upset that his caregiver took them home. Pt will have 24/7 assist upon d/c and would benefit from HHPT to address balance deficits. Acute PT to cont to follow.    Follow Up Recommendations Home health PT;Supervision/Assistance - 24 hour(has caregiver 24/7)    Equipment Recommendations  None recommended by PT    Recommendations for Other Services       Precautions / Restrictions Precautions Precautions: Fall Restrictions Weight Bearing Restrictions: No      Mobility  Bed Mobility Overal bed mobility: Modified Independent             General bed mobility comments: no use of bed rail, mild increase in time, pt reports "i'm going to be a little stiff from sleeping all night"  Transfers Overall transfer level: Needs assistance Equipment used: None Transfers: Sit to/from Stand Sit to Stand: Min guard         General transfer comment: pt mildly shaky, wide base of support, no episodes of LOB  Ambulation/Gait Ambulation/Gait assistance: Min guard Gait Distance (Feet): 150 Feet Assistive device: None Gait Pattern/deviations: Step-through pattern;Decreased stride length;Wide base of support(shaky) Gait velocity: dec Gait velocity interpretation: 1.31 - 2.62 ft/sec, indicative of limited  community ambulator General Gait Details: pt mildly unsteady but no overt episode of LOB, pt with wide base of support and decreased step height  Stairs            Wheelchair Mobility    Modified Rankin (Stroke Patients Only) Modified Rankin (Stroke Patients Only) Pre-Morbid Rankin Score: No symptoms Modified Rankin: Slight disability     Balance Overall balance assessment: Needs assistance Sitting-balance support: Feet supported;No upper extremity supported Sitting balance-Leahy Scale: Fair     Standing balance support: No upper extremity supported;During functional activity Standing balance-Leahy Scale: Fair Standing balance comment: pt able to stand and urinate and wash hands without LOB, min guard due to lateral sway and mild impulsivity                             Pertinent Vitals/Pain Pain Assessment: No/denies pain    Home Living Family/patient expects to be discharged to:: Private residence Living Arrangements: Alone Available Help at Discharge: Personal care attendant;Available 24 hours/day(has a live in caregiver for 24/7) Type of Home: House Home Access: Level entry     Home Layout: One level Home Equipment: Cane - single point;Shower seat - built in;Grab bars - toilet;Grab bars - tub/shower      Prior Function Level of Independence: Independent         Comments: drives     Hand Dominance   Dominant Hand: Right    Extremity/Trunk Assessment   Upper Extremity Assessment Upper Extremity Assessment: Overall WFL for tasks assessed    Lower Extremity  Assessment Lower Extremity Assessment: Overall WFL for tasks assessed    Cervical / Trunk Assessment Cervical / Trunk Assessment: Normal  Communication   Communication: No difficulties  Cognition Arousal/Alertness: Awake/alert Behavior During Therapy: WFL for tasks assessed/performed Overall Cognitive Status: Within Functional Limits for tasks assessed                                  General Comments: slightly impulsive, believe it's his nature/baseline      General Comments General comments (skin integrity, edema, etc.): VSS. Pt urinated and washed hands without assist    Exercises     Assessment/Plan    PT Assessment Patient needs continued PT services  PT Problem List Decreased strength;Decreased activity tolerance;Decreased balance;Decreased mobility;Decreased safety awareness       PT Treatment Interventions DME instruction;Gait training;Stair training;Functional mobility training;Therapeutic activities;Therapeutic exercise;Balance training    PT Goals (Current goals can be found in the Care Plan section)  Acute Rehab PT Goals Patient Stated Goal: home PT Goal Formulation: With patient Time For Goal Achievement: 09/05/18 Potential to Achieve Goals: Good Additional Goals Additional Goal #1: Pt to score >19 on DGI to indicate minimal falls risk.    Frequency Min 4X/week   Barriers to discharge        Co-evaluation               AM-PAC PT "6 Clicks" Mobility  Outcome Measure Help needed turning from your back to your side while in a flat bed without using bedrails?: None Help needed moving from lying on your back to sitting on the side of a flat bed without using bedrails?: A Little Help needed moving to and from a bed to a chair (including a wheelchair)?: A Little Help needed standing up from a chair using your arms (e.g., wheelchair or bedside chair)?: A Little Help needed to walk in hospital room?: A Little Help needed climbing 3-5 steps with a railing? : A Little 6 Click Score: 19    End of Session Equipment Utilized During Treatment: Gait belt Activity Tolerance: Patient tolerated treatment well Patient left: in chair;with call bell/phone within reach;with chair alarm set Nurse Communication: Mobility status PT Visit Diagnosis: Unsteadiness on feet (R26.81)    Time: 9924-2683 PT Time Calculation (min)  (ACUTE ONLY): 20 min   Charges:   PT Evaluation $PT Eval Moderate Complexity: 1 Mod          Kittie Plater, PT, DPT Acute Rehabilitation Services Pager #: [phone] Office #: [phone]   Berline Lopes 08/22/2018, 9:13 AM

## 2018-08-22 NOTE — Evaluation (Signed)
Speech Language Pathology Evaluation Patient Details Name: Anthony Jensen [facility]. MRN: [id number] DOB: 25-Apr-1926 Today's Date: 08/22/2018 Time: 1761-6073 SLP Time Calculation (min) (ACUTE ONLY): 15 min  Problem List:  Patient Active Problem List   Diagnosis Date Noted  . Stroke [facility]) -due to small vessel disease s/p TPA 08/20/2018  . Acquired hypothyroidism 12/20/2016  . Hypertensive heart disease with heart failure ([place]) 12/20/2016  . Mixed dyslipidemia 12/20/2016  . Near syncope 12/20/2016  . Presence of stent in coronary artery in patient with coronary artery disease 12/20/2016  . Chronic combined systolic and diastolic heart failure ([place]) 05/21/2015  . Pacemaker 05/21/2015  . Sinus node dysfunction ([place]) 12/24/2014  . Benign nodular prostatic hyperplasia 11/21/2014  . Nocturia 11/21/2014  . Prostate nodule 11/21/2014  . Carotid bruit 09/29/2011  . Coronary artery disease involving native coronary artery of native heart with angina pectoris ([place]) 09/28/2011  . Chronic renal insufficiency 09/28/2011  . History of nephrolithiasis 09/28/2011  . History of rheumatic fever 09/28/2011  . Hyperlipidemia 09/28/2011  . Multinodular thyroid 09/28/2011  . Peptic ulcer disease 09/28/2011  . History of alcohol abuse 09/28/2011  . History of tobacco abuse 09/28/2011  . Gout 09/28/2011  . ANEMIA 02/21/2010   Past Medical History:  Past Medical History:  Diagnosis Date  . Acquired hypothyroidism 12/20/2016  . ANEMIA 02/21/2010   Qualifier: Diagnosis of  By: Nelson-Smith CMA (AAMA), Dottie    . Benign nodular prostatic hyperplasia 11/21/2014  . Carotid bruit 09/29/2011  . Chronic combined systolic and diastolic heart failure ([place]) 05/21/2015  . Chronic renal insufficiency 09/28/2011  . Coronary artery disease 09/28/2011   Overview:  a.  Exertional chest discomfort/arm pain with dyspnea.    b.  Bare Metal Stent to proximal left circumflex, (09/1998).    c.  Cypher Stent to mid LAD,  (06/03/2005).  d.  MRI, (06/10/2010):  demonstrating normal LVEF with evidence of mild--moderate infarct of inferolateral subendocardial region with peri-infarct ischemia in the left circumflex distribution.    e.  Status post cardiac catheterization (06/23/2010) with drug-eluting stent placed at 60-80% lesion in mid-RCA.    f.  Status post cardiac cath (11/14/2010) to evaluate recurrent chest pain/arm pain and external Adenosine stress nuclear positive for septal ischemia.  Catheterization demonstrated patent stents with no change in coronary anatomy from previous study of 06/23/2010.    . Essential hypertension 12/20/2016  . Gout 09/28/2011  . History of alcohol abuse 09/28/2011  . History of nephrolithiasis 09/28/2011  . History of rheumatic fever 09/28/2011  . History of tobacco abuse 09/28/2011  . Hyperlipidemia 01/14/2017  . Hypertension 09/28/2011  . Mixed dyslipidemia 12/20/2016  . Multinodular thyroid 09/28/2011  . Near syncope 12/20/2016  . Pacemaker reprogramming/Jensen 05/21/2015   Overview:  Dual chamber 12/24/14  . Peptic ulcer disease 09/28/2011  . Presence of stent in coronary artery in patient with coronary artery disease 12/20/2016  . Prostate nodule 11/21/2014  . Sinus node dysfunction ([place]) 12/24/2014   Past Surgical History:  Past Surgical History:  Procedure Laterality Date  . CORONARY ANGIOPLASTY    . PACEMAKER IMPLANT    . PENILE PROSTHESIS IMPLANT    . THYROID SURGERY     HPI:  Anthony Jensen. is an 85 y.o. male  With PMH HLD, HTN, CAD, chronic combined HF , implanted pacemaker, who presented to [facility] ED as a code stroke for c/o gait instability and bilateral leg weakness. Pt received tPA at 14:08 on 2/15. CT negative. Awaiting MRI, limited due  to pacemaker.   Assessment / Plan / Recommendation Clinical Impression  Upon arrival, patient coming out of shower. While dressing, decreased safety awareness with impulsivity noted along with decreased recall of new information  indicated by asking multiple questions repeatedly (in regards to discharge).  However, both the patient and his 24 hour caregiver reported he was at his cognitive baseline. Speech and language function appeared [facility] for all tasks assessed. Due to patient being at his cognitive baseline with 24 hour supervision at home, skilled SLP intervention/follow-up is not warranted at this time. All verbalized understanding and agreement.       SLP Assessment  SLP Recommendation/Assessment: Patient does not need any further Speech Lanaguage Pathology Services SLP Visit Diagnosis: Cognitive communication deficit (R41.841)    Follow Up Recommendations  24 hour supervision/assistance    Frequency and Duration N/A       SLP Evaluation Cognition  Overall Cognitive Status: History of cognitive impairments - at baseline Arousal/Alertness: Awake/alert Orientation Level: Oriented X4 Memory: Impaired Memory Impairment: Decreased recall of new information Awareness: Impaired Awareness Impairment: Emergent impairment Problem Solving: Impaired Problem Solving Impairment: Functional complex Safety/Judgment: Impaired       Comprehension  Auditory Comprehension Overall Auditory Comprehension: Appears within functional limits for tasks assessed Visual Recognition/Discrimination Discrimination: Not tested Reading Comprehension Reading Status: Not tested    Expression Expression Primary Mode of Expression: Verbal Verbal Expression Overall Verbal Expression: Appears within functional limits for tasks assessed Written Expression Dominant Hand: Right Written Expression: Not tested   Oral / Motor  Oral Motor/Sensory Function Overall Oral Motor/Sensory Function: Within functional limits Motor Speech Overall Motor Speech: Appears within functional limits for tasks assessed   GO                    Anthony Jensen 08/22/2018, 1:24 PM   [place], [place], [place]

## 2018-08-22 NOTE — Telephone Encounter (Signed)
Trying to contact patient to see how he is taking medication. Chart has that he is taking 1/3 for a total of 1.67 mg. Phone busy x2.

## 2018-08-22 NOTE — Progress Notes (Addendum)
Carotid duplex       has been completed. Preliminary results can be found under CV proc through chart review. Demitris Pokorny, BS, RDMS, RVT   

## 2018-08-23 LAB — HEMOGLOBIN A1C
Hgb A1c MFr Bld: 5.5 % (ref 4.8–5.6)
Mean Plasma Glucose: 111 mg/dL

## 2018-08-30 ENCOUNTER — Other Ambulatory Visit: Payer: Self-pay | Admitting: Cardiology

## 2018-09-01 ENCOUNTER — Telehealth: Payer: Self-pay | Admitting: *Deleted

## 2018-09-01 NOTE — Telephone Encounter (Signed)
-----   Message from Garvin Fila, MD sent at 08/31/2018  4:53 PM EST ----- Kindly inform the patient that hemoglobin A1c test for diabetes screening was normal

## 2018-09-01 NOTE — Telephone Encounter (Signed)
Called, LVM for Sherrie(caregiver-on dpr) about lab results. Gave [facility] phone number if any further questions.

## 2018-10-06 ENCOUNTER — Inpatient Hospital Stay: Payer: Medicare Other | Admitting: Adult Health

## 2018-10-21 ENCOUNTER — Telehealth: Payer: Self-pay | Admitting: Physician Assistant

## 2018-10-21 NOTE — Telephone Encounter (Signed)
Spoke with the patient and his caregiver Gaynelle Cage. Notified of over due pacer check.   Barbera Setters will make a remote transmission today/tomorrow, will forward to DC to follow up and schedule remotes, and next in-clinic with Dr. Curt Bears.  They ask for [place] office preferably.  Sherrie reports both are doing well, without symptoms of fever/illness.  They have been staying home, and minimizing out of house errands as much as possible.  Educated on calling PMD should symptoms arise.  Was very appreciative of the check in and call.  Tommye Standard, PA-C

## 2018-10-24 ENCOUNTER — Telehealth: Payer: Self-pay | Admitting: Cardiology

## 2018-10-24 DIAGNOSIS — Z0000 Encounter for general adult medical examination without abnormal findings: Secondary | ICD-10-CM | POA: Diagnosis not present

## 2018-10-24 DIAGNOSIS — Z9181 History of falling: Secondary | ICD-10-CM | POA: Diagnosis not present

## 2018-10-24 DIAGNOSIS — Z1331 Encounter for screening for depression: Secondary | ICD-10-CM | POA: Diagnosis not present

## 2018-10-24 DIAGNOSIS — D519 Vitamin B12 deficiency anemia, unspecified: Secondary | ICD-10-CM | POA: Diagnosis not present

## 2018-10-24 DIAGNOSIS — E039 Hypothyroidism, unspecified: Secondary | ICD-10-CM | POA: Diagnosis not present

## 2018-10-24 NOTE — Telephone Encounter (Signed)
LMOVM for pt caregiver to return call. Pt needs to send a manual transmission w/ his home monitor. He has a carelink [id number].

## 2018-10-24 NOTE — Telephone Encounter (Signed)
-----   Message from [place], [place] sent at 10/21/2018  1:57 PM EDT ----- Patient's care giver will be sending remote transmission to bring him active.  They request his in-clinic visit with WC be in [place] office once started back up.    Thanks [employer]

## 2018-10-26 ENCOUNTER — Ambulatory Visit (INDEPENDENT_AMBULATORY_CARE_PROVIDER_SITE_OTHER): Payer: Medicare Other | Admitting: *Deleted

## 2018-10-26 DIAGNOSIS — I110 Hypertensive heart disease with heart failure: Secondary | ICD-10-CM

## 2018-10-26 DIAGNOSIS — I442 Atrioventricular block, complete: Secondary | ICD-10-CM

## 2018-10-26 LAB — CUP PACEART REMOTE DEVICE CHECK
Battery Remaining Longevity: 64 mo
Battery Voltage: 3 V
Brady Statistic AP VP Percent: 70.36 %
Brady Statistic AP VS Percent: 29.44 %
Brady Statistic AS VP Percent: 0.1 %
Brady Statistic AS VS Percent: 0.1 %
Brady Statistic RA Percent Paced: 99.75 %
Brady Statistic RV Percent Paced: 69.11 %
Date Time Interrogation Session: 20200422172300
Implantable Lead Implant Date: 20160620
Implantable Lead Implant Date: 20160620
Implantable Lead Location: 753859
Implantable Lead Location: 753860
Implantable Lead Model: 5076
Implantable Lead Model: 5076
Implantable Pulse Generator Implant Date: 20160620
Lead Channel Impedance Value: 323 Ohm
Lead Channel Impedance Value: 380 Ohm
Lead Channel Impedance Value: 399 Ohm
Lead Channel Impedance Value: 494 Ohm
Lead Channel Pacing Threshold Amplitude: 0.5 V
Lead Channel Pacing Threshold Amplitude: 0.75 V
Lead Channel Pacing Threshold Pulse Width: 0.4 ms
Lead Channel Pacing Threshold Pulse Width: 0.4 ms
Lead Channel Sensing Intrinsic Amplitude: 5.125 mV
Lead Channel Sensing Intrinsic Amplitude: 5.125 mV
Lead Channel Sensing Intrinsic Amplitude: 6.75 mV
Lead Channel Sensing Intrinsic Amplitude: 6.75 mV
Lead Channel Setting Pacing Amplitude: 1.5 V
Lead Channel Setting Pacing Amplitude: 2 V
Lead Channel Setting Pacing Pulse Width: 0.4 ms
Lead Channel Setting Sensing Sensitivity: 0.9 mV

## 2018-10-26 NOTE — Telephone Encounter (Signed)
Spoke w/ pt daughter and she stated that she will send the transmission today.

## 2018-10-27 NOTE — Telephone Encounter (Signed)
Transmission received. See result notes .  Chanetta Marshall, NP 10/27/2018 9:33 AM

## 2018-11-03 ENCOUNTER — Other Ambulatory Visit: Payer: Self-pay

## 2018-11-03 NOTE — Progress Notes (Signed)
Remote pacemaker transmission.   

## 2018-11-17 ENCOUNTER — Telehealth: Payer: Self-pay

## 2018-11-17 NOTE — Telephone Encounter (Signed)
I called Anthony Jensen pts caregiver about visit will still be video due to COVID 19. I stated this is due to pt being high risk. I receive verbal consent from Anthony Jensen to do video and to Anthony Jensen. I stated this will not be a in office visit. I also stated pt is very high risk to have a office visit. I verified Anthony Jensen cell number at [phone]. Her cell phone carrier is [employer]. I explain her the doxy video process. While on the phone I text her the link to her cell phone. Anthony Jensen stated she receive the link. I advise her to click on the link 10 minutes before appt at 215pm. She verbalized understanding.

## 2018-11-18 ENCOUNTER — Other Ambulatory Visit: Payer: Self-pay

## 2018-11-18 NOTE — Patient Outreach (Signed)
First attempt to obtain mRs. No answer. Left message for return call.  

## 2018-11-22 ENCOUNTER — Ambulatory Visit: Payer: Medicare Other | Admitting: Adult Health

## 2018-11-22 ENCOUNTER — Other Ambulatory Visit: Payer: Self-pay

## 2018-11-22 NOTE — Progress Notes (Deleted)
[facility] [address] [place]. [place] [address] [phone]       VIRTUAL VISIT FOLLOW UP NOTE  [name] Anthony Jensen. Date of Birth:  [birth year] Medical Record Number:  [id number]   Reason for Referral:  hospital stroke follow up    Virtual Visit via Video Note  I connected with Anthony Jensen. on 11/22/18 at  2:15 PM EDT by a video enabled telemedicine application located remotely in my own home and verified that I am speaking with the correct person using two identifiers who was located at their own home.   Visit scheduled by Anthony Fillers, RN. She discussed the limitations of evaluation and management by telemedicine and the availability of in person appointments. The patient expressed understanding and agreed to proceed.Please see telephone note for additional scheduling information and consent.    CHIEF COMPLAINT:  No chief complaint on file.   HPI: Anthony Jensen. was initially scheduled today for in office hospital follow-up regarding *** but due to COVID-19 safety precautions, visit transition to telemedicine via *** with patients consent. History obtained from *** and chart review. Reviewed all radiology images and labs personally.  Anthony Jensenis a 85 y.o.malewith history ofHLD, HTN, CAD, chronic combinedCHF, implanted pacemaker,who presented with gait instability and bilateral leg weakness.NIHSS 4.  CT head no acute intracranial abnormality therefore tPA administered without complication.  Repeat CT head no evidence of acute abnormality.  Unable to perform MRI due to PPM.  Unable to perform CTA head and neck due to elevated creatinine and no indication to pursue additional vascular imaging given SVT etiology.  Probable small stroke not seen on imaging status post IV TPA due to small vessel disease.  2D echo EF 25 to 30% without cardiac source of embolus identified.  LDL 116 with recommendation of  changing Provigil to atorvastatin for HTN management.  HTN stable.  Other stroke risk factors include advanced age, former tobacco use, former alcohol use, CAD and CHF.  Previously on DAPT and recommended continuation.  Discharged home with recommendations of home health PT.     ROS:   14 system review of systems performed and negative with exception of ***  PMH:  Past Medical History:  Diagnosis Date  . Acquired hypothyroidism 12/20/2016  . ANEMIA 02/21/2010   Qualifier: Diagnosis of  By: Nelson-Smith CMA (AAMA), Dottie    . Benign nodular prostatic hyperplasia 11/21/2014  . Carotid bruit 09/29/2011  . Chronic combined systolic and diastolic heart failure ([place]) 05/21/2015  . Chronic renal insufficiency 09/28/2011  . Coronary artery disease 09/28/2011   Overview:  a.  Exertional chest discomfort/arm pain with dyspnea.    b.  Bare Metal Stent to proximal left circumflex, (09/1998).    c.  Cypher Stent to mid LAD, (06/03/2005).  d.  MRI, (06/10/2010):  demonstrating normal LVEF with evidence of mild--moderate infarct of inferolateral subendocardial region with peri-infarct ischemia in the left circumflex distribution.    e.  Status post cardiac catheterization (06/23/2010) with drug-eluting stent placed at 60-80% lesion in mid-RCA.    f.  Status post cardiac cath (11/14/2010) to evaluate recurrent chest pain/arm pain and external Adenosine stress nuclear positive for septal ischemia.  Catheterization demonstrated patent stents with no change in coronary anatomy from previous study of 06/23/2010.    . Essential hypertension 12/20/2016  . Gout 09/28/2011  . History of alcohol abuse 09/28/2011  . History of nephrolithiasis 09/28/2011  . History of rheumatic fever 09/28/2011  .  History of tobacco abuse 09/28/2011  . Hyperlipidemia 01/14/2017  . Hypertension 09/28/2011  . Mixed dyslipidemia 12/20/2016  . Multinodular thyroid 09/28/2011  . Near syncope 12/20/2016  . Pacemaker reprogramming/check 05/21/2015    Overview:  Dual chamber 12/24/14  . Peptic ulcer disease 09/28/2011  . Presence of stent in coronary artery in patient with coronary artery disease 12/20/2016  . Prostate nodule 11/21/2014  . Sinus node dysfunction ([place]) 12/24/2014    PSH:  Past Surgical History:  Procedure Laterality Date  . CORONARY ANGIOPLASTY    . PACEMAKER IMPLANT    . PENILE PROSTHESIS IMPLANT    . THYROID SURGERY      Social History:  Social History   Socioeconomic History  . Marital status: Widowed    Spouse name: Not on file  . Number of children: Not on file  . Years of education: Not on file  . Highest education level: Not on file  Occupational History  . Not on file  Social Needs  . Jensen resource strain: Not on file  . Food insecurity:    Worry: Not on file    Inability: Not on file  . Transportation needs:    Medical: Not on file    Non-medical: Not on file  Tobacco Use  . Smoking status: Former Smoker    Types: Cigarettes  . Smokeless tobacco: Never Used  Substance and Sexual Activity  . Alcohol use: No  . Drug use: No  . Sexual activity: Not on file  Lifestyle  . Physical activity:    Days per week: Not on file    Minutes per session: Not on file  . Stress: Not on file  Relationships  . Social connections:    Talks on phone: Not on file    Gets together: Not on file    Attends religious service: Not on file    Active member of club or organization: Not on file    Attends meetings of clubs or organizations: Not on file    Relationship status: Not on file  . Intimate partner violence:    Fear of current or ex partner: Not on file    Emotionally abused: Not on file    Physically abused: Not on file    Forced sexual activity: Not on file  Other Topics Concern  . Not on file  Social History Narrative  . Not on file    Family History:  Family History  Problem Relation Age of Onset  . CAD Father     Medications:   Current Outpatient Medications on File Prior to Visit   Medication Sig Dispense Refill  . amLODipine (NORVASC) 5 MG tablet Take 0.5-1 tablets (2.5-5 mg total) by mouth See admin instructions. Take 1/2 tablet (2.5 mg) by mouth daily if BP at least 140/60, take 1 tablet (5 mg) if BP 160/60 or higher.    Anthony Kitchen aspirin 325 MG tablet Take 1 tablet (325 mg total) by mouth daily.    Anthony Kitchen atorvastatin (LIPITOR) 20 MG tablet Take 1 tablet (20 mg total) by mouth daily at 6 PM. 30 tablet 2  . clopidogrel (PLAVIX) 75 MG tablet TAKE ONE (1) TABLET BY MOUTH ONCE DAILY 30 tablet 3  . furosemide (LASIX) 40 MG tablet Take 1 tablet (40 mg total) by mouth daily.    Anthony Kitchen levothyroxine (SYNTHROID, LEVOTHROID) 125 MCG tablet Take 125 mcg by mouth daily.     . nebivolol (BYSTOLIC) 5 MG tablet Take 0.5 tablets (2.5 mg total) by mouth  daily. 30 tablet 6  . potassium chloride SA (K-DUR,KLOR-CON) 20 MEQ tablet TAKE ONE-HALF TABLET DAILY 15 tablet 3   No current facility-administered medications on file prior to visit.     Allergies:   Allergies  Allergen Reactions  . Lorazepam Other (See Comments)    Made him crazy  . Crestor [Rosuvastatin Calcium] Other (See Comments)    myalgias     Physical Exam  There were no vitals filed for this visit. There is no height or weight on file to calculate BMI. No exam data present  No flowsheet data found.   General: well developed, well nourished, seated, in no evident distress Head: head normocephalic and atraumatic.     Neurologic Exam Mental Status: Awake and fully alert. Oriented to place and time. Recent and remote memory intact. Attention span, concentration and fund of knowledge appropriate. Mood and affect appropriate.  Cranial Nerves: Fundoscopic exam reveals sharp disc margins. Pupils equal, briskly reactive to light. Extraocular movements full without nystagmus. Visual fields full to confrontation. Hearing intact. Facial sensation intact. Face, tongue, palate moves normally and symmetrically.  Motor: Normal bulk and tone.  Normal strength in all tested extremity muscles. Sensory.: intact to touch , pinprick , position and vibratory sensation.  Coordination: Rapid alternating movements normal in all extremities. Finger-to-nose and heel-to-shin performed accurately bilaterally. Gait and Station: Arises from chair without difficulty. Stance is normal. Gait demonstrates normal stride length and balance. Able to heel, toe and tandem walk without difficulty.  Reflexes: 1+ and symmetric. Toes downgoing.    NIHSS  *** Modified Rankin  *** CHA2DS2-VASc *** HAS-BLED ***   Diagnostic Data (Labs, Imaging, Testing)  CT HEAD WO CONTRAST ***  CT ANGIO HEAD W OR WO CONTRAST CT ANGIO NECK W OR WO CONTRAST ***  MR BRAIN WO CONTRAST ***  MR MRA HEAD  MR MRA NECK ***  ECHOCARDIOGRAM ***    ASSESSMENT: Anthony Jensen. is a 85 y.o. year old male here with *** on *** secondary to ***. Vascular risk factors include ***.     PLAN:  1. *** : Continue {anticoagulants:31417}  and ***  for secondary stroke prevention. Maintain strict control of hypertension with blood pressure goal below 130/90, diabetes with hemoglobin A1c goal below 6.5% and cholesterol with LDL cholesterol (bad cholesterol) goal below 70 mg/dL.  I also advised the patient to eat a healthy diet with plenty of whole grains, cereals, fruits and vegetables, exercise regularly with at least 30 minutes of continuous activity daily and maintain ideal body weight. 2. HTN: Advised to continue current treatment regimen.  Today's BP ***.  Advised to continue to monitor at home along with continued follow-up with PCP for management 3. HLD: Advised to continue current treatment regimen along with continued follow-up with PCP for future prescribing and monitoring of lipid panel 4. DMII: Advised to continue to monitor glucose levels at home along with continued follow-up with PCP for management and monitoring    Follow up in *** or call earlier if  needed   Greater than 50% of time during this 45 minute visit was spent on counseling, explanation of diagnosis of ***, reviewing risk factor management of ***, planning of further management along with potential future management, and discussion with patient and family answering all questions.    Anthony Jensen, AGNP-BC  [facility] [address] [place], [place] [address]  Phone [phone] Fax [phone] Note: This document was prepared with digital dictation and possible smart phrase  technology. Any transcriptional errors that result from this process are unintentional.

## 2018-11-25 ENCOUNTER — Other Ambulatory Visit: Payer: Self-pay

## 2018-11-25 NOTE — Patient Outreach (Signed)
Second attempt to obtain mRs. No answer. Left message for return call. Called Caregiver Sherrie per [employer].

## 2018-11-30 DIAGNOSIS — L579 Skin changes due to chronic exposure to nonionizing radiation, unspecified: Secondary | ICD-10-CM | POA: Diagnosis not present

## 2018-11-30 DIAGNOSIS — L570 Actinic keratosis: Secondary | ICD-10-CM | POA: Diagnosis not present

## 2018-12-05 ENCOUNTER — Other Ambulatory Visit: Payer: Self-pay

## 2018-12-05 NOTE — Patient Outreach (Signed)
3 outreach attempts were completed to obtain mRs. mRs could not be obtained because patient never returned my calls. mRs=7 

## 2018-12-21 ENCOUNTER — Telehealth: Payer: Self-pay

## 2018-12-21 NOTE — Telephone Encounter (Signed)
Rn tried to call pt to set up hospital follow up. RN unable to reach . Vm not set up. Could not leave message.

## 2018-12-27 NOTE — Telephone Encounter (Signed)
Left vm for patients caregiver Sherrie to call back to schedule hospital stroke follow up with Janett Billow NP.

## 2018-12-28 NOTE — Telephone Encounter (Signed)
Left vm for patients caregiver Sherrie that pt needs a hospital stroke follow up appt.  Two vm left for caregiver and attempt was made to home number vm not set up.

## 2018-12-29 NOTE — Telephone Encounter (Signed)
Pt caretaker returned call to RN Katrina, while phone rep reached out to [place] the caretaker disconnected the call

## 2018-12-29 NOTE — Telephone Encounter (Signed)
If pt or caregiver calls back please schedule with JEssica NP for hospital follow up.  I called Sherrie caregiver and it went straight to vm. LEft vm for appt to be schedule for hospital follow up.

## 2019-01-25 ENCOUNTER — Encounter: Payer: Medicare Other | Admitting: *Deleted

## 2019-01-26 ENCOUNTER — Telehealth: Payer: Self-pay

## 2019-01-26 NOTE — Telephone Encounter (Signed)
Left message for patient to remind of missed remote transmission.  

## 2019-02-22 ENCOUNTER — Ambulatory Visit (INDEPENDENT_AMBULATORY_CARE_PROVIDER_SITE_OTHER): Payer: Medicare Other | Admitting: *Deleted

## 2019-02-22 DIAGNOSIS — I442 Atrioventricular block, complete: Secondary | ICD-10-CM | POA: Diagnosis not present

## 2019-02-23 LAB — CUP PACEART REMOTE DEVICE CHECK
Battery Remaining Longevity: 63 mo
Battery Voltage: 3 V
Brady Statistic AP VP Percent: 52.46 %
Brady Statistic AP VS Percent: 45.02 %
Brady Statistic AS VP Percent: 1.89 %
Brady Statistic AS VS Percent: 0.64 %
Brady Statistic RA Percent Paced: 96.79 %
Brady Statistic RV Percent Paced: 53.97 %
Date Time Interrogation Session: 20200819040816
Implantable Lead Implant Date: 20160620
Implantable Lead Implant Date: 20160620
Implantable Lead Location: 753859
Implantable Lead Location: 753860
Implantable Lead Model: 5076
Implantable Lead Model: 5076
Implantable Pulse Generator Implant Date: 20160620
Lead Channel Impedance Value: 323 Ohm
Lead Channel Impedance Value: 380 Ohm
Lead Channel Impedance Value: 399 Ohm
Lead Channel Impedance Value: 494 Ohm
Lead Channel Pacing Threshold Amplitude: 0.625 V
Lead Channel Pacing Threshold Amplitude: 0.625 V
Lead Channel Pacing Threshold Pulse Width: 0.4 ms
Lead Channel Pacing Threshold Pulse Width: 0.4 ms
Lead Channel Sensing Intrinsic Amplitude: 4.5 mV
Lead Channel Sensing Intrinsic Amplitude: 4.5 mV
Lead Channel Sensing Intrinsic Amplitude: 7.125 mV
Lead Channel Sensing Intrinsic Amplitude: 7.125 mV
Lead Channel Setting Pacing Amplitude: 1.5 V
Lead Channel Setting Pacing Amplitude: 2 V
Lead Channel Setting Pacing Pulse Width: 0.4 ms
Lead Channel Setting Sensing Sensitivity: 0.9 mV

## 2019-03-02 ENCOUNTER — Encounter: Payer: Self-pay | Admitting: Cardiology

## 2019-03-02 NOTE — Progress Notes (Signed)
Remote pacemaker transmission.   

## 2019-03-31 ENCOUNTER — Other Ambulatory Visit: Payer: Self-pay | Admitting: Cardiology

## 2019-04-27 ENCOUNTER — Other Ambulatory Visit: Payer: Self-pay | Admitting: Cardiology

## 2019-04-27 DIAGNOSIS — I10 Essential (primary) hypertension: Secondary | ICD-10-CM

## 2019-04-27 DIAGNOSIS — I5042 Chronic combined systolic (congestive) and diastolic (congestive) heart failure: Secondary | ICD-10-CM

## 2019-04-27 MED ORDER — NEBIVOLOL HCL 5 MG PO TABS: 2.5000 mg | ORAL_TABLET | Freq: Every day | ORAL | 0 refills | Status: DC

## 2019-04-27 MED ORDER — CLOPIDOGREL BISULFATE 75 MG PO TABS: 75.0000 mg | ORAL_TABLET | Freq: Once | ORAL | 0 refills | Status: AC

## 2019-04-27 MED ORDER — AMLODIPINE BESYLATE 5 MG PO TABS: ORAL_TABLET | ORAL | 0 refills | Status: DC

## 2019-04-27 MED ORDER — POTASSIUM CHLORIDE CRYS ER 20 MEQ PO TBCR: 10.0000 meq | EXTENDED_RELEASE_TABLET | Freq: Every day | ORAL | 0 refills | Status: DC

## 2019-04-27 MED ORDER — FUROSEMIDE 40 MG PO TABS: 40.0000 mg | ORAL_TABLET | Freq: Every day | ORAL | 0 refills | Status: DC

## 2019-04-27 NOTE — Telephone Encounter (Signed)
°  He does have appt in December but on wait list   1. Which medications need to be refilled? (please list name of each medication and dose if known) Clopidogrel, furosemide, nebivolol, amlodipine, potassium chloride  2. Which pharmacy/location (including street and city if local pharmacy) is medication to be sent to?[facility]  3. Do they need a 30 day or 90 day supply? [place]

## 2019-04-27 NOTE — Telephone Encounter (Signed)
All Rx's sent to [facility] as requested.

## 2019-05-24 ENCOUNTER — Encounter: Payer: Medicare Other | Admitting: *Deleted

## 2019-05-24 ENCOUNTER — Other Ambulatory Visit: Payer: Self-pay | Admitting: Cardiology

## 2019-05-24 DIAGNOSIS — I10 Essential (primary) hypertension: Secondary | ICD-10-CM

## 2019-05-24 DIAGNOSIS — I5042 Chronic combined systolic (congestive) and diastolic (congestive) heart failure: Secondary | ICD-10-CM

## 2019-05-25 DIAGNOSIS — J329 Chronic sinusitis, unspecified: Secondary | ICD-10-CM | POA: Diagnosis not present

## 2019-05-25 DIAGNOSIS — J40 Bronchitis, not specified as acute or chronic: Secondary | ICD-10-CM | POA: Diagnosis not present

## 2019-05-26 ENCOUNTER — Other Ambulatory Visit: Payer: Self-pay

## 2019-05-26 ENCOUNTER — Emergency Department (HOSPITAL_COMMUNITY)
Admission: EM | Admit: 2019-05-26 | Discharge: 2019-05-27 | Disposition: A | Discharge status: Home / Self Care | Payer: Medicare Other | Attending: Emergency Medicine | Admitting: Emergency Medicine

## 2019-05-26 ENCOUNTER — Encounter (HOSPITAL_COMMUNITY): Payer: Self-pay | Admitting: Emergency Medicine

## 2019-05-26 DIAGNOSIS — R000 Tachycardia, unspecified: Secondary | ICD-10-CM | POA: Diagnosis not present

## 2019-05-26 DIAGNOSIS — N189 Chronic kidney disease, unspecified: Secondary | ICD-10-CM | POA: Insufficient documentation

## 2019-05-26 DIAGNOSIS — J189 Pneumonia, unspecified organism: Secondary | ICD-10-CM | POA: Insufficient documentation

## 2019-05-26 DIAGNOSIS — E039 Hypothyroidism, unspecified: Secondary | ICD-10-CM | POA: Insufficient documentation

## 2019-05-26 DIAGNOSIS — Z7982 Long term (current) use of aspirin: Secondary | ICD-10-CM | POA: Insufficient documentation

## 2019-05-26 DIAGNOSIS — I2510 Atherosclerotic heart disease of native coronary artery without angina pectoris: Secondary | ICD-10-CM | POA: Diagnosis not present

## 2019-05-26 DIAGNOSIS — I491 Atrial premature depolarization: Secondary | ICD-10-CM | POA: Diagnosis not present

## 2019-05-26 DIAGNOSIS — Z20822 Contact with and (suspected) exposure to covid-19: Secondary | ICD-10-CM

## 2019-05-26 DIAGNOSIS — Z79899 Other long term (current) drug therapy: Secondary | ICD-10-CM | POA: Diagnosis not present

## 2019-05-26 DIAGNOSIS — I5042 Chronic combined systolic (congestive) and diastolic (congestive) heart failure: Secondary | ICD-10-CM | POA: Diagnosis not present

## 2019-05-26 DIAGNOSIS — Z955 Presence of coronary angioplasty implant and graft: Secondary | ICD-10-CM | POA: Diagnosis not present

## 2019-05-26 DIAGNOSIS — Z87891 Personal history of nicotine dependence: Secondary | ICD-10-CM | POA: Diagnosis not present

## 2019-05-26 DIAGNOSIS — I130 Hypertensive heart and chronic kidney disease with heart failure and stage 1 through stage 4 chronic kidney disease, or unspecified chronic kidney disease: Secondary | ICD-10-CM | POA: Insufficient documentation

## 2019-05-26 DIAGNOSIS — Z950 Presence of cardiac pacemaker: Secondary | ICD-10-CM | POA: Insufficient documentation

## 2019-05-26 DIAGNOSIS — R05 Cough: Secondary | ICD-10-CM | POA: Diagnosis not present

## 2019-05-26 DIAGNOSIS — R0602 Shortness of breath: Secondary | ICD-10-CM | POA: Diagnosis not present

## 2019-05-26 DIAGNOSIS — R509 Fever, unspecified: Secondary | ICD-10-CM | POA: Diagnosis not present

## 2019-05-26 DIAGNOSIS — R0902 Hypoxemia: Secondary | ICD-10-CM | POA: Diagnosis not present

## 2019-05-26 NOTE — ED Provider Notes (Signed)
TIME SEEN: 11:47 PM  CHIEF COMPLAINT: cough  HPI: Patient is a 85 year old male with history of CAD status post stents, CHF, chronic kidney disease, hypertension, hyperlipidemia, sinus node dysfunction status post pacemaker who presents to the emergency department with 4 days of nonproductive cough, pleuritic chest pain.  No shortness of breath no chest pain currently.  Concerned that he could have bronchitis versus pneumonia.  No known Covid exposures.  Denies fevers despite nursing notes.  No loss of taste or smell.  No vomiting or diarrhea.  Lives at home with his wife.  Is a retired Therapist, music.  States this does not feel like his anginal equivalent.  No history of PE or DVT.  No lower extremity swelling or pain.  States he was just tested for COVID-19 as an outpatient yesterday and is awaiting his results.  Caregiver, Sherrie, [phone]  ROS: See HPI Constitutional: no fever  Eyes: no drainage  ENT: no runny nose   Cardiovascular:   chest pain  Resp:  SOB  GI: no vomiting GU: no dysuria Integumentary: no rash  Allergy: no hives  Musculoskeletal: no leg swelling  Neurological: no slurred speech ROS otherwise negative  PAST MEDICAL HISTORY/PAST SURGICAL HISTORY:  Past Medical History:  Diagnosis Date  . Acquired hypothyroidism 12/20/2016  . ANEMIA 02/21/2010   Qualifier: Diagnosis of  By: Nelson-Smith CMA (AAMA), Dottie    . Benign nodular prostatic hyperplasia 11/21/2014  . Carotid bruit 09/29/2011  . Chronic combined systolic and diastolic heart failure ([place]) 05/21/2015  . Chronic renal insufficiency 09/28/2011  . Coronary artery disease 09/28/2011   Overview:  a.  Exertional chest discomfort/arm pain with dyspnea.    b.  Bare Metal Stent to proximal left circumflex, (09/1998).    c.  Cypher Stent to mid LAD, (06/03/2005).  d.  MRI, (06/10/2010):  demonstrating normal LVEF with evidence of mild--moderate infarct of inferolateral subendocardial region with peri-infarct  ischemia in the left circumflex distribution.    e.  Status post cardiac catheterization (06/23/2010) with drug-eluting stent placed at 60-80% lesion in mid-RCA.    f.  Status post cardiac cath (11/14/2010) to evaluate recurrent chest pain/arm pain and external Adenosine stress nuclear positive for septal ischemia.  Catheterization demonstrated patent stents with no change in coronary anatomy from previous study of 06/23/2010.    . Essential hypertension 12/20/2016  . Gout 09/28/2011  . History of alcohol abuse 09/28/2011  . History of nephrolithiasis 09/28/2011  . History of rheumatic fever 09/28/2011  . History of tobacco abuse 09/28/2011  . Hyperlipidemia 01/14/2017  . Hypertension 09/28/2011  . Mixed dyslipidemia 12/20/2016  . Multinodular thyroid 09/28/2011  . Near syncope 12/20/2016  . Pacemaker reprogramming/check 05/21/2015   Overview:  Dual chamber 12/24/14  . Peptic ulcer disease 09/28/2011  . Presence of stent in coronary artery in patient with coronary artery disease 12/20/2016  . Prostate nodule 11/21/2014  . Sinus node dysfunction ([place]) 12/24/2014    MEDICATIONS:  Prior to Admission medications   Medication Sig Start Date End Date Taking? Authorizing Provider  amLODipine (NORVASC) 5 MG tablet TAKE 1 TABLET EVERY MORNING, TAKE A SECOND TABLET IN THE EVENING ON MONDAY, [facility] AND FRIDAY 04/27/19   Richardo Priest, MD  aspirin 325 MG tablet Take 1 tablet (325 mg total) by mouth daily. 08/23/18   Donzetta Starch, NP  atorvastatin (LIPITOR) 20 MG tablet Take 1 tablet (20 mg total) by mouth daily at 6 PM. 08/22/18   Donzetta Starch, NP  furosemide (LASIX)  40 MG tablet Take 1 tablet (40 mg total) by mouth daily. 04/27/19   Richardo Priest, MD  levothyroxine (SYNTHROID, LEVOTHROID) 125 MCG tablet Take 125 mcg by mouth daily.     [provider]  nebivolol (BYSTOLIC) 5 MG tablet Take 0.5 tablets (2.5 mg total) by mouth daily. 04/27/19 07/26/19  Richardo Priest, MD  potassium chloride SA  (KLOR-CON) 20 MEQ tablet Take 0.5 tablets (10 mEq total) by mouth daily. 04/27/19   Richardo Priest, MD    ALLERGIES:  Allergies  Allergen Reactions  . Lorazepam Other (See Comments)    Made him crazy  . Crestor [Rosuvastatin Calcium] Other (See Comments)    myalgias    SOCIAL HISTORY:  Social History   Tobacco Use  . Smoking status: Former Smoker    Types: Cigarettes  . Smokeless tobacco: Never Used  Substance Use Topics  . Alcohol use: No    FAMILY HISTORY: Family History  Problem Relation Age of Onset  . CAD Father     EXAM: BP (!) 151/73 (BP Location: Left Arm)   Pulse 72   Temp 98.1 F (36.7 C) (Oral)   Resp (!) 27   Ht 5\' 7"  (1.702 m)   Wt 68 kg   SpO2 97%   BMI 23.49 kg/m  CONSTITUTIONAL: Alert and oriented and responds appropriately to questions. Well-appearing; well-nourished, elderly, in no distress, extremely pleasant HEAD: Normocephalic EYES: Conjunctivae clear, pupils appear equal, EOM appear intact ENT: normal nose; moist mucous membranes NECK: Supple, normal ROM CARD: RRR; S1 and S2 appreciated; no murmurs, no clicks, no rubs, no gallops RESP: Normal chest excursion without splinting, initially tachypneic but has improved; breath sounds clear and equal bilaterally; no wheezes, no rhonchi, no rales, no hypoxia or respiratory distress, speaking full sentences ABD/GI: Normal bowel sounds; non-distended; soft, non-tender, no rebound, no guarding, no peritoneal signs, no hepatosplenomegaly BACK:  The back appears normal EXT: Normal ROM in all joints; no deformity noted, no edema; no cyanosis SKIN: Normal color for age and race; warm; no rash on exposed skin NEURO: Moves all extremities equally, able to ambulate without difficulty PSYCH: The patient's mood and manner are appropriate.   MEDICAL DECISION MAKING: Patient here with cough, pleuritic chest pain.  Differential includes pneumonia, COVID-19, PE, less likely ACS or dissection.  Does not appear  volume overloaded today.  Will check labs, chest x-ray.  He currently has a Covid swab pending as an outpatient.  EKG today shows no ischemic abnormality.  He does not appear in distress and has no increased work of breathing currently or hypoxia.  On my evaluation, patient is not tachypneic.  He would like to go home if possible.  We discussed plan of care at length.  Patient agrees.  We will not repeat Covid swab at this time given he had one done as an outpatient.  He has no tachycardia, tachypnea, hypoxia or increased work of breathing currently.  ED PROGRESS: 2:00 AM  Pt's labs show leukocytosis of 11,000 with left shift.  Creatinine appears stable from baseline.  Troponin is 18.  D-dimer elevated at 1.50.  Lactate normal.  Chest x-ray shows cardiomegaly and fine reticular opacities that appear chronic suggesting interstitial lung disease.  When I went to reevaluate patient and update with plan patient was out of bed getting dressed and had pulled out his own IV.  Blood dripping on the floor.  Patient cursing.  Stating "nobody has done anything for me here".  States that  he "just came for a chest x-ray".  Discussed with patient again regarding the plan of care and my concern for his pleuritic chest pain and now elevated D-dimer.  Have recommended a CTA of his chest.  Patient provided with a copy of his results.  At this time he agrees to having IV replaced and obtaining a CTA of the chest.  Caregiver Sherrie has also talked to patient and encouraged him to stay for work up.   Rectal temp of 101.7.  Will give a gram of Tylenol.   5:20 AM  Pt's repeat troponin stable at 18.  He is not having any active chest pain now.  CT of his chest shows no PE but does show bilateral consolidative and groundglass opacities suspicious for pneumonia.  This could be COVID-19 and he has an outpatient test pending.  Will start on doxycycline in case this is bacterial in nature.  He denies that he is ever had any shortness  of breath and has not been hypoxic here.  Able to ambulate in his room and sats stay in the mid 90s.  Did not drop below 93%.  He is not on oxygen at home.  He would like discharge home.  I have left a message with his caregiver Judeen Hammans who planned to pick patient up from the ED.   At this time, I do not feel there is any life-threatening condition present. I have reviewed, interpreted and discussed all results (EKG, imaging, lab, urine as appropriate) and exam findings with patient/family. I have reviewed nursing notes and appropriate previous records.  I feel the patient is safe to be discharged home without further emergent workup and can continue workup as an outpatient as needed. Discussed usual and customary return precautions. Patient/family verbalize understanding and are comfortable with this plan.  Outpatient follow-up has been provided as needed. All questions have been answered.   EKG Interpretation  Date/Time:  Friday May 26 2019 23:43:40 EST Ventricular Rate:  73 PR Interval:    QRS Duration: 161 QT Interval:  440 QTC Calculation: 485 R Axis:   -13 Text Interpretation: Sinus rhythm Atrial premature complex Prolonged PR interval Left bundle branch block No significant change since last tracing Confirmed by , Cyril Mourning [phone]) on 05/26/2019 11:48:03 PM        CRITICAL CARE Performed by: Cyril Mourning    Total critical care time: 65 minutes  Critical care time was exclusive of separately billable procedures and treating other patients.  Critical care was necessary to treat or prevent imminent or life-threatening deterioration.  Critical care was time spent personally by me on the following activities: development of treatment plan with patient and/or surrogate as well as nursing, discussions with consultants, evaluation of patient's response to treatment, examination of patient, obtaining history from patient or surrogate, ordering and performing treatments and  interventions, ordering and review of laboratory studies, ordering and review of radiographic studies, pulse oximetry and re-evaluation of patient's condition.   Merrily Pew Muench Brooke Bonito. was evaluated in Emergency Department on 05/26/2019 for the symptoms described in the history of present illness. He was evaluated in the context of the global COVID-19 pandemic, which necessitated consideration that the patient might be at risk for infection with the SARS-CoV-2 virus that causes COVID-19. Institutional protocols and algorithms that pertain to the evaluation of patients at risk for COVID-19 are in a state of rapid change based on information released by regulatory bodies including the CDC and federal and state organizations. These policies and algorithms  were followed during the patient's care in the ED.  Patient was seen wearing N95, face shield, gloves, gown.    , Delice Bison, DO 05/27/19 [phone]

## 2019-05-26 NOTE — ED Triage Notes (Signed)
Pt transported to ed via [facility] from home c/o increased shob, cough, body aches and intermittent fever. Syptoms started 2 days ago. Pt denies altered taste or smell.

## 2019-05-27 ENCOUNTER — Emergency Department (HOSPITAL_COMMUNITY): Payer: Medicare Other

## 2019-05-27 DIAGNOSIS — R0602 Shortness of breath: Secondary | ICD-10-CM | POA: Diagnosis not present

## 2019-05-27 DIAGNOSIS — J189 Pneumonia, unspecified organism: Secondary | ICD-10-CM | POA: Diagnosis not present

## 2019-05-27 DIAGNOSIS — R05 Cough: Secondary | ICD-10-CM | POA: Diagnosis not present

## 2019-05-27 DIAGNOSIS — R509 Fever, unspecified: Secondary | ICD-10-CM | POA: Diagnosis not present

## 2019-05-27 LAB — COMPREHENSIVE METABOLIC PANEL
ALT: 11 U/L (ref 0–44)
AST: 17 U/L (ref 15–41)
Albumin: 3.1 g/dL — ABNORMAL LOW (ref 3.5–5.0)
Alkaline Phosphatase: 83 U/L (ref 38–126)
Anion gap: 8 (ref 5–15)
BUN: 24 mg/dL — ABNORMAL HIGH (ref 8–23)
CO2: 23 mmol/L (ref 22–32)
Calcium: 8.6 mg/dL — ABNORMAL LOW (ref 8.9–10.3)
Chloride: 103 mmol/L (ref 98–111)
Creatinine, Ser: 1.81 mg/dL — ABNORMAL HIGH (ref 0.61–1.24)
GFR calc Af Amer: 37 mL/min — ABNORMAL LOW (ref >60)
GFR calc non Af Amer: 32 mL/min — ABNORMAL LOW (ref >60)
Glucose, Bld: 116 mg/dL — ABNORMAL HIGH (ref 70–99)
Potassium: 3.6 mmol/L (ref 3.5–5.1)
Sodium: 134 mmol/L — ABNORMAL LOW (ref 135–145)
Total Bilirubin: 0.8 mg/dL (ref 0.3–1.2)
Total Protein: 6.3 g/dL — ABNORMAL LOW (ref 6.5–8.1)

## 2019-05-27 LAB — CBC WITH DIFFERENTIAL/PLATELET
Abs Immature Granulocytes: 0.04 10*3/uL (ref 0.00–0.07)
Basophils Absolute: 0.1 10*3/uL (ref 0.0–0.1)
Basophils Relative: 1 %
Eosinophils Absolute: 0.3 10*3/uL (ref 0.0–0.5)
Eosinophils Relative: 3 %
HCT: 33.8 % — ABNORMAL LOW (ref 39.0–52.0)
Hemoglobin: 11.1 g/dL — ABNORMAL LOW (ref 13.0–17.0)
Immature Granulocytes: 0 %
Lymphocytes Relative: 12 %
Lymphs Abs: 1.3 10*3/uL (ref 0.7–4.0)
MCH: 28.2 pg (ref 26.0–34.0)
MCHC: 32.8 g/dL (ref 30.0–36.0)
MCV: 85.8 fL (ref 80.0–100.0)
Monocytes Absolute: 0.9 10*3/uL (ref 0.1–1.0)
Monocytes Relative: 8 %
Neutro Abs: 8.7 10*3/uL — ABNORMAL HIGH (ref 1.7–7.7)
Neutrophils Relative %: 76 %
Platelets: 163 10*3/uL (ref 150–400)
RBC: 3.94 MIL/uL — ABNORMAL LOW (ref 4.22–5.81)
RDW: 14.6 % (ref 11.5–15.5)
WBC: 11.3 10*3/uL — ABNORMAL HIGH (ref 4.0–10.5)
nRBC: 0 % (ref 0.0–0.2)

## 2019-05-27 LAB — URINALYSIS, ROUTINE W REFLEX MICROSCOPIC
Bacteria, UA: NONE SEEN
Bilirubin Urine: NEGATIVE
Glucose, UA: NEGATIVE mg/dL
Hgb urine dipstick: NEGATIVE
Ketones, ur: NEGATIVE mg/dL
Leukocytes,Ua: NEGATIVE
Nitrite: NEGATIVE
Protein, ur: 30 mg/dL — AB
Specific Gravity, Urine: 1.017 (ref 1.005–1.030)
pH: 5 (ref 5.0–8.0)

## 2019-05-27 LAB — BRAIN NATRIURETIC PEPTIDE: B Natriuretic Peptide: 982.3 pg/mL — ABNORMAL HIGH (ref 0.0–100.0)

## 2019-05-27 LAB — TROPONIN I (HIGH SENSITIVITY)
Troponin I (High Sensitivity): 18 ng/L — ABNORMAL HIGH (ref <18)
Troponin I (High Sensitivity): 18 ng/L — ABNORMAL HIGH (ref <18)

## 2019-05-27 LAB — LACTIC ACID, PLASMA: Lactic Acid, Venous: 0.9 mmol/L (ref 0.5–1.9)

## 2019-05-27 LAB — D-DIMER, QUANTITATIVE: D-Dimer, Quant: 1.5 ug/mL-FEU — ABNORMAL HIGH (ref 0.00–0.50)

## 2019-05-27 MED ORDER — ONDANSETRON 4 MG PO TBDP
4.0000 mg | ORAL_TABLET | Freq: Once | ORAL | Status: AC
  Administered 2019-05-27: 4 mg via ORAL
  Filled 2019-05-27: qty 1

## 2019-05-27 MED ORDER — IOHEXOL 350 MG/ML SOLN
100.0000 mL | Freq: Once | INTRAVENOUS | Status: AC | PRN
  Administered 2019-05-27: 100 mL via INTRAVENOUS

## 2019-05-27 MED ORDER — DOXYCYCLINE HYCLATE 100 MG PO CAPS: 100.0000 mg | ORAL_CAPSULE | Freq: Two times a day (BID) | ORAL | 0 refills | Status: DC

## 2019-05-27 MED ORDER — ONDANSETRON 4 MG PO TBDP: 4.0000 mg | ORAL_TABLET | Freq: Four times a day (QID) | ORAL | 0 refills | Status: DC | PRN

## 2019-05-27 MED ORDER — SODIUM CHLORIDE 0.9 % IV BOLUS (SEPSIS)
500.0000 mL | Freq: Once | INTRAVENOUS | Status: AC
  Administered 2019-05-27: 500 mL via INTRAVENOUS

## 2019-05-27 MED ORDER — DOXYCYCLINE HYCLATE 100 MG PO TABS
100.0000 mg | ORAL_TABLET | Freq: Once | ORAL | Status: AC
  Administered 2019-05-27: 05:00:00 100 mg via ORAL
  Filled 2019-05-27: qty 1

## 2019-05-27 MED ORDER — ACETAMINOPHEN 500 MG PO TABS
1000.0000 mg | ORAL_TABLET | Freq: Once | ORAL | Status: AC
  Administered 2019-05-27: 1000 mg via ORAL
  Filled 2019-05-27: qty 2

## 2019-05-27 NOTE — ED Notes (Signed)
Pt's caregiver calls; no answer. Left message.

## 2019-05-27 NOTE — ED Notes (Signed)
Pt ambulated around room. SPo2 stayed at 93%, pt denied Shob. MD aware.

## 2019-05-27 NOTE — ED Notes (Signed)
101.7 rectal temp

## 2019-05-27 NOTE — ED Notes (Signed)
Pt discharge instructions reviewed with pt. Pt verbalized understanding of instructions. Pt discharged. 

## 2019-05-27 NOTE — ED Notes (Signed)
Patient transported to CT 

## 2019-05-27 NOTE — Discharge Instructions (Addendum)
Please quarantine for 10 days after the onset of symptoms.  You will need to be fever free without using Tylenol or Ibuprofen for 3 full days AND your symptoms will need to be significantly improving or resolved (other than the loss of taste and smell which can last up to 3 months) before you can come out of quarantine.  You should isolate from others that you live with as well.  Any close contacts that have seen you in the past 14 days before your symptoms have started will need to be notified and they will need to quarantine for 14 full days even if they have a negative COVID test.  COVID-19 is a viral illness and currently there are no specific outpatient treatments other than supportive measures such has alternating Tylenol and Ibuprofen, rest, increased fluid intake.  We are starting you on antibiotics in case you have bacterial pneumonia.  If you develop chest pain, difficulty breathing, have blue lips or fingertips, begin vomiting and can not stop and can not hold down fluids, feel like you may pass out or you do pass out, have confusion, please return to the emergency department.  You may take Tylenol 1000 mg every 6 hours as needed for fever.

## 2019-05-27 NOTE — ED Notes (Signed)
Attempted call to caregiver

## 2019-05-28 LAB — URINE CULTURE: Culture: NO GROWTH

## 2019-06-01 LAB — CULTURE, BLOOD (ROUTINE X 2)
Culture: NO GROWTH
Culture: NO GROWTH
Special Requests: ADEQUATE

## 2019-06-07 NOTE — Progress Notes (Deleted)
Cardiology Office Note:    Date:  06/08/2019   ID:  Anthony Jensen., DOB 10-14-1925, MRN [id number]  PCP:  [name], MD  Cardiologist:  Shirlee More, MD    Referring MD: [name], MD    ASSESSMENT:    1. Coronary artery disease involving native coronary artery of native heart with angina pectoris ([place])   2. Chronic combined systolic and diastolic heart failure ([place])   3. Hypertensive heart disease with heart failure ([place])   4. Mixed dyslipidemia   5. Atrioventricular block, complete ([place])   6. Pacemaker   7. Stage 3b chronic kidney disease    PLAN:    In order of problems listed above:  1. ***   Next appointment: ***   Medication Adjustments/Labs and Tests Ordered: Current medicines are reviewed at length with the patient today.  Concerns regarding medicines are outlined above.  No orders of the defined types were placed in this encounter.  No orders of the defined types were placed in this encounter.   No chief complaint on file.   History of Present Illness:    Anthony Jensen. is a 85 y.o. male with a hx of coronary artery disease history of STEMI multiple PCI's hypertension CKD hyperlipidemia heart block Medtronic dual-chamber pacemaker last seen 06/20/2018. Compliance with diet, lifestyle and medications: ***   11d ago (05/27/19) 11d ago (05/27/19)   Troponin I (High Sensitivity) <18 ng/L 18High   18High      Ref Range & Units 11d ago  D-Dimer, Quant 0.00 - 0.50 ug/mL-FEU 1.50High      Ref Range & Units 11d ago 51mo ago  B Natriuretic Peptide 0.0 - 100.0 pg/mL 982.3High   700.7High    CTA chest : 05/27/2019: IMPRESSION: 1. No pulmonary embolus. 2. Bilateral consolidative and ground-glass opacities suspicious for pneumonia. Findings may represent but are not classic for COVID-19, and other infectious etiologies are considered. 3. Mild emphysema and bronchial thickening. Slight subpleural reticulation can be  seen with interstitial lung disease. 4. Shotty mediastinal and right hilar nodes are likely reactive.  EKG 05/26/2019: [place] with  LBBB  His last pacemaker checked 02/22/2019 showed normal parameters and function battery status was good and he is ventricularly paced greater than 50% of the time atrially paced 97%. Past Medical History:  Diagnosis Date  . Acquired hypothyroidism 12/20/2016  . ANEMIA 02/21/2010   Qualifier: Diagnosis of  By: Nelson-Smith CMA (AAMA), Dottie    . Benign nodular prostatic hyperplasia 11/21/2014  . Carotid bruit 09/29/2011  . Chronic combined systolic and diastolic heart failure ([place]) 05/21/2015  . Chronic renal insufficiency 09/28/2011  . Coronary artery disease 09/28/2011   Overview:  a.  Exertional chest discomfort/arm pain with dyspnea.    b.  Bare Metal Stent to proximal left circumflex, (09/1998).    c.  Cypher Stent to mid LAD, (06/03/2005).  d.  MRI, (06/10/2010):  demonstrating normal LVEF with evidence of mild--moderate infarct of inferolateral subendocardial region with peri-infarct ischemia in the left circumflex distribution.    e.  Status post cardiac catheterization (06/23/2010) with drug-eluting stent placed at 60-80% lesion in mid-RCA.    f.  Status post cardiac cath (11/14/2010) to evaluate recurrent chest pain/arm pain and external Adenosine stress nuclear positive for septal ischemia.  Catheterization demonstrated patent stents with no change in coronary anatomy from previous study of 06/23/2010.    . Essential hypertension 12/20/2016  . Gout 09/28/2011  . History of alcohol  abuse 09/28/2011  . History of nephrolithiasis 09/28/2011  . History of rheumatic fever 09/28/2011  . History of tobacco abuse 09/28/2011  . Hyperlipidemia 01/14/2017  . Hypertension 09/28/2011  . Mixed dyslipidemia 12/20/2016  . Multinodular thyroid 09/28/2011  . Near syncope 12/20/2016  . Pacemaker reprogramming/check 05/21/2015   Overview:  Dual chamber 12/24/14  . Peptic ulcer  disease 09/28/2011  . Presence of stent in coronary artery in patient with coronary artery disease 12/20/2016  . Prostate nodule 11/21/2014  . Sinus node dysfunction ([place]) 12/24/2014    Past Surgical History:  Procedure Laterality Date  . CORONARY ANGIOPLASTY    . PACEMAKER IMPLANT    . PENILE PROSTHESIS IMPLANT    . THYROID SURGERY      Current Medications: No outpatient medications have been marked as taking for the 06/08/19 encounter (Appointment) with Richardo Priest, MD.     Allergies:   Lorazepam and Crestor [rosuvastatin calcium]   Social History   Socioeconomic History  . Marital status: Widowed    Spouse name: Not on file  . Number of children: Not on file  . Years of education: Not on file  . Highest education level: Not on file  Occupational History  . Not on file  Social Needs  . Financial resource strain: Not on file  . Food insecurity    Worry: Not on file    Inability: Not on file  . Transportation needs    Medical: Not on file    Non-medical: Not on file  Tobacco Use  . Smoking status: Former Smoker    Types: Cigarettes  . Smokeless tobacco: Never Used  Substance and Sexual Activity  . Alcohol use: No  . Drug use: No  . Sexual activity: Not on file  Lifestyle  . Physical activity    Days per week: Not on file    Minutes per session: Not on file  . Stress: Not on file  Relationships  . Social Herbalist on phone: Not on file    Gets together: Not on file    Attends religious service: Not on file    Active member of club or organization: Not on file    Attends meetings of clubs or organizations: Not on file    Relationship status: Not on file  Other Topics Concern  . Not on file  Social History Narrative  . Not on file     Family History: The patient's ***family history includes CAD in his father. ROS:   Please see the history of present illness.    All other systems reviewed and are negative.  EKGs/Labs/Other Studies Reviewed:     The following studies were reviewed today:  EKG:  EKG ordered today and personally reviewed.  The ekg ordered today demonstrates ***  Recent Labs: 06/20/2018: NT-Pro BNP CANCELED 05/27/2019: ALT 11; B Natriuretic Peptide 982.3; BUN 24; Creatinine, Ser 1.81; Hemoglobin 11.1; Platelets 163; Potassium 3.6; Sodium 134  Recent Lipid Panel    Component Value Date/Time   CHOL 191 08/21/2018 0419   CHOL 215 (H) 06/20/2018 1645   TRIG 85 08/21/2018 0419   HDL 58 08/21/2018 0419   HDL 70 06/20/2018 1645   CHOLHDL 3.3 08/21/2018 0419   VLDL 17 08/21/2018 0419   LDLCALC 116 (H) 08/21/2018 0419   LDLCALC 117 (H) 06/20/2018 1645    Physical Exam:    VS:  There were no vitals taken for this visit.    Wt Readings from Last 3  Encounters:  05/26/19 150 lb (68 kg)  08/20/18 153 lb 7 oz (69.6 kg)  06/20/18 154 lb (69.9 kg)     GEN: *** Well nourished, well developed in no acute distress HEENT: Normal NECK: No JVD; No carotid bruits LYMPHATICS: No lymphadenopathy CARDIAC: ***RRR, no murmurs, rubs, gallops RESPIRATORY:  Clear to auscultation without rales, wheezing or rhonchi  ABDOMEN: Soft, non-tender, non-distended MUSCULOSKELETAL:  No edema; No deformity  SKIN: Warm and dry NEUROLOGIC:  Alert and oriented x 3 PSYCHIATRIC:  Normal affect    Signed, Shirlee More, MD  06/08/2019 1:06 PM    [facility]

## 2019-06-08 ENCOUNTER — Ambulatory Visit: Payer: Medicare Other | Admitting: Cardiology

## 2019-07-14 ENCOUNTER — Ambulatory Visit: Payer: Medicare Other | Admitting: Cardiology

## 2019-07-14 NOTE — Progress Notes (Deleted)
Cardiology Office Note:    Date:  07/14/2019   ID:  Anthony Jensen., DOB [birth year], MRN [id number]  PCP:  [name], MD  Cardiologist:  Shirlee More, MD    Referring MD: [name], MD    ASSESSMENT:    No diagnosis found. PLAN:    In order of problems listed above:  1. ***   Next appointment: ***   Medication Adjustments/Labs and Tests Ordered: Current medicines are reviewed at length with the patient today.  Concerns regarding medicines are outlined above.  No orders of the defined types were placed in this encounter.  No orders of the defined types were placed in this encounter.   No chief complaint on file.   History of Present Illness:    Anthony Jensen. is a 86 y.o. male with a hx of coronary artery disease history of STEMI multiple PCI's hypertension with CHF,CKD hyperlipidemia heart block Medtronic dual-chamber pacemake  last seen 06/20/2018. Compliance with diet, lifestyle and medications: ***   Ref Range & Units 1 mo ago 10 mo ago  B Natriuretic Peptide 0.0 - 100.0 pg/mL 982.3High   700.    1 mo ago  (05/27/19) 1 mo ago  (05/27/19)   Troponin I (High Sensitivity) <18 ng/L 18High   18High    CTA chest 05/26/2018: IMPRESSION: 1. No pulmonary embolus. 2. Bilateral consolidative and ground-glass opacities suspicious for pneumonia. Findings may represent but are not classic for COVID-19, and other infectious etiologies are considered. 3. Mild emphysema and bronchial thickening. Slight subpleural reticulation can be seen with interstitial lung disease. 4. Shotty mediastinal and right hilar nodes are likely reactive Past Medical History:  Diagnosis Date  . Acquired hypothyroidism 12/20/2016  . ANEMIA 02/21/2010   Qualifier: Diagnosis of  By: Nelson-Smith CMA (AAMA), Dottie    . Benign nodular prostatic hyperplasia 11/21/2014  . Carotid bruit 09/29/2011  . Chronic combined systolic and diastolic heart failure ([place])  05/21/2015  . Chronic renal insufficiency 09/28/2011  . Coronary artery disease 09/28/2011   Overview:  a.  Exertional chest discomfort/arm pain with dyspnea.    b.  Bare Metal Stent to proximal left circumflex, (09/1998).    c.  Cypher Stent to mid LAD, (06/03/2005).  d.  MRI, (06/10/2010):  demonstrating normal LVEF with evidence of mild--moderate infarct of inferolateral subendocardial region with peri-infarct ischemia in the left circumflex distribution.    e.  Status post cardiac catheterization (06/23/2010) with drug-eluting stent placed at 60-80% lesion in mid-RCA.    f.  Status post cardiac cath (11/14/2010) to evaluate recurrent chest pain/arm pain and external Adenosine stress nuclear positive for septal ischemia.  Catheterization demonstrated patent stents with no change in coronary anatomy from previous study of 06/23/2010.    . Essential hypertension 12/20/2016  . Gout 09/28/2011  . History of alcohol abuse 09/28/2011  . History of nephrolithiasis 09/28/2011  . History of rheumatic fever 09/28/2011  . History of tobacco abuse 09/28/2011  . Hyperlipidemia 01/14/2017  . Hypertension 09/28/2011  . Mixed dyslipidemia 12/20/2016  . Multinodular thyroid 09/28/2011  . Near syncope 12/20/2016  . Pacemaker reprogramming/check 05/21/2015   Overview:  Dual chamber 12/24/14  . Peptic ulcer disease 09/28/2011  . Presence of stent in coronary artery in patient with coronary artery disease 12/20/2016  . Prostate nodule 11/21/2014  . Sinus node dysfunction ([place]) 12/24/2014    Past Surgical History:  Procedure Laterality Date  . CORONARY ANGIOPLASTY    . PACEMAKER IMPLANT    .  PENILE PROSTHESIS IMPLANT    . THYROID SURGERY      Current Medications: No outpatient medications have been marked as taking for the 07/14/19 encounter (Appointment) with Richardo Priest, MD.     Allergies:   Lorazepam and Crestor [rosuvastatin calcium]   Social History   Socioeconomic History  . Marital status: Widowed     Spouse name: Not on file  . Number of children: Not on file  . Years of education: Not on file  . Highest education level: Not on file  Occupational History  . Not on file  Tobacco Use  . Smoking status: Former Smoker    Types: Cigarettes  . Smokeless tobacco: Never Used  Substance and Sexual Activity  . Alcohol use: No  . Drug use: No  . Sexual activity: Not on file  Other Topics Concern  . Not on file  Social History Narrative  . Not on file   Social Determinants of Health   Financial Resource Strain:   . Difficulty of Paying Living Expenses: Not on file  Food Insecurity:   . Worried About Charity fundraiser in the Last Year: Not on file  . Ran Out of Food in the Last Year: Not on file  Transportation Needs:   . Lack of Transportation (Medical): Not on file  . Lack of Transportation (Non-Medical): Not on file  Physical Activity:   . Days of Exercise per Week: Not on file  . Minutes of Exercise per Session: Not on file  Stress:   . Feeling of Stress : Not on file  Social Connections:   . Frequency of Communication with Friends and Family: Not on file  . Frequency of Social Gatherings with Friends and Family: Not on file  . Attends Religious Services: Not on file  . Active Member of Clubs or Organizations: Not on file  . Attends Archivist Meetings: Not on file  . Marital Status: Not on file     Family History: The patient's ***family history includes CAD in his father. ROS:   Please see the history of present illness.    All other systems reviewed and are negative.  EKGs/Labs/Other Studies Reviewed:    The following studies were reviewed today:  EKG:  EKG ordered today and personally reviewed.  The ekg ordered today demonstrates ***  Recent Labs: 05/27/2019: ALT 11; B Natriuretic Peptide 982.3; BUN 24; Creatinine, Ser 1.81; Hemoglobin 11.1; Platelets 163; Potassium 3.6; Sodium 134  Recent Lipid Panel    Component Value Date/Time   CHOL 191  08/21/2018 0419   CHOL 215 (H) 06/20/2018 1645   TRIG 85 08/21/2018 0419   HDL 58 08/21/2018 0419   HDL 70 06/20/2018 1645   CHOLHDL 3.3 08/21/2018 0419   VLDL 17 08/21/2018 0419   LDLCALC 116 (H) 08/21/2018 0419   LDLCALC 117 (H) 06/20/2018 1645    Physical Exam:    VS:  There were no vitals taken for this visit.    Wt Readings from Last 3 Encounters:  05/26/19 150 lb (68 kg)  08/20/18 153 lb 7 oz (69.6 kg)  06/20/18 154 lb (69.9 kg)     GEN: *** Well nourished, well developed in no acute distress HEENT: Normal NECK: No JVD; No carotid bruits LYMPHATICS: No lymphadenopathy CARDIAC: ***RRR, no murmurs, rubs, gallops RESPIRATORY:  Clear to auscultation without rales, wheezing or rhonchi  ABDOMEN: Soft, non-tender, non-distended MUSCULOSKELETAL:  No edema; No deformity  SKIN: Warm and dry NEUROLOGIC:  Alert and  oriented x 3 PSYCHIATRIC:  Normal affect    Signed, Shirlee More, MD  07/14/2019 12:48 PM    [facility]

## 2019-08-07 DIAGNOSIS — E559 Vitamin D deficiency, unspecified: Secondary | ICD-10-CM | POA: Diagnosis not present

## 2019-08-07 DIAGNOSIS — Z79899 Other long term (current) drug therapy: Secondary | ICD-10-CM | POA: Diagnosis not present

## 2019-08-07 DIAGNOSIS — H6123 Impacted cerumen, bilateral: Secondary | ICD-10-CM | POA: Diagnosis not present

## 2019-08-07 DIAGNOSIS — I259 Chronic ischemic heart disease, unspecified: Secondary | ICD-10-CM | POA: Diagnosis not present

## 2019-08-07 DIAGNOSIS — E039 Hypothyroidism, unspecified: Secondary | ICD-10-CM | POA: Diagnosis not present

## 2019-08-07 DIAGNOSIS — E7800 Pure hypercholesterolemia, unspecified: Secondary | ICD-10-CM | POA: Diagnosis not present

## 2019-08-07 DIAGNOSIS — I10 Essential (primary) hypertension: Secondary | ICD-10-CM | POA: Diagnosis not present

## 2019-08-07 DIAGNOSIS — Z6824 Body mass index (BMI) 24.0-24.9, adult: Secondary | ICD-10-CM | POA: Diagnosis not present

## 2019-08-14 ENCOUNTER — Ambulatory Visit (INDEPENDENT_AMBULATORY_CARE_PROVIDER_SITE_OTHER): Payer: Medicare Other | Admitting: Cardiology

## 2019-08-14 ENCOUNTER — Encounter: Payer: Self-pay | Admitting: Cardiology

## 2019-08-14 ENCOUNTER — Other Ambulatory Visit: Payer: Self-pay

## 2019-08-14 VITALS — BP 116/70 | HR 86 | Temp 97.3°F | Ht 67.0 in | Wt 155.0 lb

## 2019-08-14 DIAGNOSIS — Z950 Presence of cardiac pacemaker: Secondary | ICD-10-CM

## 2019-08-14 DIAGNOSIS — I110 Hypertensive heart disease with heart failure: Secondary | ICD-10-CM | POA: Diagnosis not present

## 2019-08-14 DIAGNOSIS — I442 Atrioventricular block, complete: Secondary | ICD-10-CM | POA: Diagnosis not present

## 2019-08-14 DIAGNOSIS — I25119 Atherosclerotic heart disease of native coronary artery with unspecified angina pectoris: Secondary | ICD-10-CM

## 2019-08-14 DIAGNOSIS — E039 Hypothyroidism, unspecified: Secondary | ICD-10-CM | POA: Diagnosis not present

## 2019-08-14 DIAGNOSIS — E782 Mixed hyperlipidemia: Secondary | ICD-10-CM | POA: Diagnosis not present

## 2019-08-14 NOTE — Patient Instructions (Signed)
Medication Instructions:  Your physician recommends that you continue on your current medications as directed. Please refer to the Current Medication list given to you today.  *If you need a refill on your cardiac medications before your next appointment, please call your pharmacy*  Lab Work: Your physician recommends that you return for lab work in: TODAY TSH,T3,T4,CMP,ProBNP  If you have labs (blood work) drawn today and your tests are completely normal, you will receive your results only by: Marland Kitchen MyChart Message (if you have MyChart) OR . A paper copy in the mail If you have any lab test that is abnormal or we need to change your treatment, we will call you to review the results.  Testing/Procedures: None  Follow-Up: At [facility], you and your health needs are our priority.  As part of our continuing mission to provide you with exceptional heart care, we have created designated Provider Care Teams.  These Care Teams include your primary Cardiologist (physician) and Advanced Practice Providers (APPs -  Physician Assistants and Nurse Practitioners) who all work together to provide you with the care you need, when you need it.  Your next appointment:   6 month(s)  The format for your next appointment:   In Person  Provider:   Shirlee More, MD  Other Instructions

## 2019-08-14 NOTE — Progress Notes (Signed)
Cardiology Office Note:    Date:  08/14/2019   ID:  Anthony Pew [name], DOB 1925-07-13, MRN [id number]  PCP:  [name], MD  Cardiologist:  Shirlee More, MD    Referring MD: [name], MD    ASSESSMENT:    1. Coronary artery disease involving native coronary artery of native heart with angina pectoris ([place])   2. Atrioventricular block, complete ([place])   3. Pacemaker   4. Hypertensive heart disease with heart failure ([place])   5. Mixed dyslipidemia    PLAN:    In order of problems listed above:  1. Stable CAD on long-term dual antiplatelet therapy no angina on current treatment [place] Heart Association class I continue beta-blocker calcium channel blocker and resume statin therapy 2. Pacemaker followed in device clinic 3. Hypertension at target no evidence of heart failure tenuous diuretic and check renal function proBNP with CKD 4. Poorly controlled resume a low-dose of a low intensity statin 2 days a week 5. Is hypothyroid at his request I will recheck thyroid studies and send a copy to his PCP     Next appointment: 6 months   Medication Adjustments/Labs and Tests Ordered: Current medicines are reviewed at length with the patient today.  Concerns regarding medicines are outlined above.  No orders of the defined types were placed in this encounter.  No orders of the defined types were placed in this encounter.   Chief Complaint  Patient presents with  . Follow-up  . Coronary Artery Disease  . Congestive Heart Failure    History of Present Illness:    Anthony Jensen. is a 86 y.o. male with a hx of  coronary artery disease history of STEMI multiple PCI's hypertension CKD hyperlipidemia heart block Medtronic dual-chamber pacemaker  last seen 06/20/2018. Compliance with diet, lifestyle and medications: yes  He feels well he is active as had not any signs or symptoms of Covid infection and has had no angina edema shortness of breath  palpitation or syncope.  He stopped his statin because of muscle symptoms and agrees to restart a low-dose of pravastatin 2 days a week to minimize muscle symptoms.  I think it is age group is a good approach for class effect.  Had no edema follows in our device clinic. Past Medical History:  Diagnosis Date  . Acquired hypothyroidism 12/20/2016  . ANEMIA 02/21/2010   Qualifier: Diagnosis of  By: Nelson-Smith CMA (AAMA), Dottie    . Benign nodular prostatic hyperplasia 11/21/2014  . Carotid bruit 09/29/2011  . Chronic combined systolic and diastolic heart failure ([place]) 05/21/2015  . Chronic renal insufficiency 09/28/2011  . Coronary artery disease 09/28/2011   Overview:  a.  Exertional chest discomfort/arm pain with dyspnea.    b.  Bare Metal Stent to proximal left circumflex, (09/1998).    c.  Cypher Stent to mid LAD, (06/03/2005).  d.  MRI, (06/10/2010):  demonstrating normal LVEF with evidence of mild--moderate infarct of inferolateral subendocardial region with peri-infarct ischemia in the left circumflex distribution.    e.  Status post cardiac catheterization (06/23/2010) with drug-eluting stent placed at 60-80% lesion in mid-RCA.    f.  Status post cardiac cath (11/14/2010) to evaluate recurrent chest pain/arm pain and external Adenosine stress nuclear positive for septal ischemia.  Catheterization demonstrated patent stents with no change in coronary anatomy from previous study of 06/23/2010.    . Essential hypertension 12/20/2016  . Gout 09/28/2011  . History of alcohol abuse  09/28/2011  . History of nephrolithiasis 09/28/2011  . History of rheumatic fever 09/28/2011  . History of tobacco abuse 09/28/2011  . Hyperlipidemia 01/14/2017  . Hypertension 09/28/2011  . Mixed dyslipidemia 12/20/2016  . Multinodular thyroid 09/28/2011  . Near syncope 12/20/2016  . Pacemaker reprogramming/check 05/21/2015   Overview:  Dual chamber 12/24/14  . Peptic ulcer disease 09/28/2011  . Presence of stent in coronary  artery in patient with coronary artery disease 12/20/2016  . Prostate nodule 11/21/2014  . Sinus node dysfunction ([place]) 12/24/2014    Past Surgical History:  Procedure Laterality Date  . CORONARY ANGIOPLASTY    . PACEMAKER IMPLANT    . PENILE PROSTHESIS IMPLANT    . THYROID SURGERY      Current Medications: Current Meds  Medication Sig  . amLODipine (NORVASC) 5 MG tablet TAKE 1 TABLET EVERY MORNING, TAKE A SECOND TABLET IN THE EVENING ON MONDAY, WEDNESDAY AND FRIDAY (Patient taking differently: Take 5 mg by mouth See admin instructions. Take 5 mg by mouth in the morning daily and an additional 5 mg in the evening only on Mon/Wed/Fri)  . aspirin EC 81 MG tablet Take 81 mg by mouth daily.  . clopidogrel (PLAVIX) 75 MG tablet Take 75 mg by mouth daily.  . furosemide (LASIX) 40 MG tablet Take 1 tablet (40 mg total) by mouth daily.  Marland Kitchen levothyroxine (SYNTHROID, LEVOTHROID) 125 MCG tablet Take 125 mcg by mouth daily.   . nebivolol (BYSTOLIC) 5 MG tablet Take 0.5 tablets (2.5 mg total) by mouth daily.  . potassium chloride SA (KLOR-CON) 20 MEQ tablet Take 0.5 tablets (10 mEq total) by mouth daily.  . [DISCONTINUED] aspirin 325 MG tablet Take 1 tablet (325 mg total) by mouth daily.     Allergies:   Lorazepam and Crestor [rosuvastatin calcium]   Social History   Socioeconomic History  . Marital status: Widowed    Spouse name: Not on file  . Number of children: Not on file  . Years of education: Not on file  . Highest education level: Not on file  Occupational History  . Not on file  Tobacco Use  . Smoking status: Former Smoker    Types: Cigarettes  . Smokeless tobacco: Never Used  Substance and Sexual Activity  . Alcohol use: No  . Drug use: No  . Sexual activity: Not Currently  Other Topics Concern  . Not on file  Social History Narrative  . Not on file   Social Determinants of Health   Financial Resource Strain:   . Difficulty of Paying Living Expenses: Not on file  Food  Insecurity:   . Worried About Charity fundraiser in the Last Year: Not on file  . Ran Out of Food in the Last Year: Not on file  Transportation Needs:   . Lack of Transportation (Medical): Not on file  . Lack of Transportation (Non-Medical): Not on file  Physical Activity:   . Days of Exercise per Week: Not on file  . Minutes of Exercise per Session: Not on file  Stress:   . Feeling of Stress : Not on file  Social Connections:   . Frequency of Communication with Friends and Family: Not on file  . Frequency of Social Gatherings with Friends and Family: Not on file  . Attends Religious Services: Not on file  . Active Member of Clubs or Organizations: Not on file  . Attends Archivist Meetings: Not on file  . Marital Status: Not on file  Family History: The patient's family history includes CAD in his father. ROS:   Please see the history of present illness.    All other systems reviewed and are negative.  EKGs/Labs/Other Studies Reviewed:    The following studies were reviewed today:  EKG:  EKG ordered 22,020 personally reviewed demonstrates sinus rhythm left bundle branch block  Recent Labs: 05/27/2019: ALT 11; B Natriuretic Peptide 982.3; BUN 24; Creatinine, Ser 1.81; Hemoglobin 11.1; Platelets 163; Potassium 3.6; Sodium 134   Handcarried a note recent labs from his PCP with a TSH severely elevated and a LDL cholesterol greater than 160. Recent Lipid Panel    Component Value Date/Time   CHOL 191 08/21/2018 0419   CHOL 215 (H) 06/20/2018 1645   TRIG 85 08/21/2018 0419   HDL 58 08/21/2018 0419   HDL 70 06/20/2018 1645   CHOLHDL 3.3 08/21/2018 0419   VLDL 17 08/21/2018 0419   LDLCALC 116 (H) 08/21/2018 0419   LDLCALC 117 (H) 06/20/2018 1645    Physical Exam:    VS:  BP 116/70   Pulse 86   Temp (!) 97.3 F (36.3 C)   Ht 5\' 7"  (1.702 m)   Wt 155 lb (70.3 kg)   SpO2 98%   BMI 24.28 kg/m     Wt Readings from Last 3 Encounters:  08/14/19 155 lb  (70.3 kg)  05/26/19 150 lb (68 kg)  08/20/18 153 lb 7 oz (69.6 kg)     GEN:  Well nourished, well developed in no acute distress HEENT: Normal NECK: No JVD; No carotid bruits LYMPHATICS: No lymphadenopathy CARDIAC: S2 paradoxical RRR, no murmurs, rubs, gallops RESPIRATORY:  Clear to auscultation without rales, wheezing or rhonchi  ABDOMEN: Soft, non-tender, non-distended MUSCULOSKELETAL:  No edema; No deformity  SKIN: Warm and dry NEUROLOGIC:  Alert and oriented x 3 PSYCHIATRIC:  Normal affect    Signed, Shirlee More, MD  08/14/2019 4:14 PM    [facility]

## 2019-08-15 LAB — T3: T3, Total: 43 ng/dL — ABNORMAL LOW (ref 71–180)

## 2019-08-15 LAB — PRO B NATRIURETIC PEPTIDE: NT-Pro BNP: 1537 pg/mL — ABNORMAL HIGH (ref 0–486)

## 2019-08-15 LAB — COMPREHENSIVE METABOLIC PANEL
ALT: 6 IU/L (ref 0–44)
AST: 15 IU/L (ref 0–40)
Albumin/Globulin Ratio: 1.8 (ref 1.2–2.2)
Albumin: 4.1 g/dL (ref 3.5–4.6)
Alkaline Phosphatase: 103 IU/L (ref 39–117)
BUN/Creatinine Ratio: 12 (ref 10–24)
BUN: 23 mg/dL (ref 10–36)
Bilirubin Total: 0.3 mg/dL (ref 0.0–1.2)
CO2: 23 mmol/L (ref 20–29)
Calcium: 9.5 mg/dL (ref 8.6–10.2)
Chloride: 103 mmol/L (ref 96–106)
Creatinine, Ser: 1.93 mg/dL — ABNORMAL HIGH (ref 0.76–1.27)
GFR calc Af Amer: 34 mL/min/{1.73_m2} — ABNORMAL LOW (ref >59)
GFR calc non Af Amer: 30 mL/min/{1.73_m2} — ABNORMAL LOW (ref >59)
Globulin, Total: 2.3 g/dL (ref 1.5–4.5)
Glucose: 107 mg/dL — ABNORMAL HIGH (ref 65–99)
Potassium: 4 mmol/L (ref 3.5–5.2)
Sodium: 142 mmol/L (ref 134–144)
Total Protein: 6.4 g/dL (ref 6.0–8.5)

## 2019-08-15 LAB — T4: T4, Total: 5.5 ug/dL (ref 4.5–12.0)

## 2019-08-15 LAB — TSH: TSH: 148 u[IU]/mL — ABNORMAL HIGH (ref 0.450–4.500)

## 2019-08-31 DIAGNOSIS — Z23 Encounter for immunization: Secondary | ICD-10-CM | POA: Diagnosis not present

## 2019-09-15 ENCOUNTER — Ambulatory Visit (INDEPENDENT_AMBULATORY_CARE_PROVIDER_SITE_OTHER): Payer: Medicare Other | Admitting: *Deleted

## 2019-09-15 DIAGNOSIS — Z950 Presence of cardiac pacemaker: Secondary | ICD-10-CM

## 2019-09-16 LAB — CUP PACEART REMOTE DEVICE CHECK
Battery Remaining Longevity: 55 mo
Battery Voltage: 2.99 V
Brady Statistic AP VP Percent: 62.68 %
Brady Statistic AP VS Percent: 31.07 %
Brady Statistic AS VP Percent: 5.35 %
Brady Statistic AS VS Percent: 0.9 %
Brady Statistic RA Percent Paced: 92.79 %
Brady Statistic RV Percent Paced: 67 %
Date Time Interrogation Session: 20210312160946
Implantable Lead Implant Date: 20160620
Implantable Lead Implant Date: 20160620
Implantable Lead Location: 753859
Implantable Lead Location: 753860
Implantable Lead Model: 5076
Implantable Lead Model: 5076
Implantable Pulse Generator Implant Date: 20160620
Lead Channel Impedance Value: 323 Ohm
Lead Channel Impedance Value: 380 Ohm
Lead Channel Impedance Value: 380 Ohm
Lead Channel Impedance Value: 475 Ohm
Lead Channel Pacing Threshold Amplitude: 0.625 V
Lead Channel Pacing Threshold Amplitude: 0.625 V
Lead Channel Pacing Threshold Pulse Width: 0.4 ms
Lead Channel Pacing Threshold Pulse Width: 0.4 ms
Lead Channel Sensing Intrinsic Amplitude: 22.625 mV
Lead Channel Sensing Intrinsic Amplitude: 22.625 mV
Lead Channel Sensing Intrinsic Amplitude: 4.25 mV
Lead Channel Sensing Intrinsic Amplitude: 4.25 mV
Lead Channel Setting Pacing Amplitude: 1.5 V
Lead Channel Setting Pacing Amplitude: 2 V
Lead Channel Setting Pacing Pulse Width: 0.4 ms
Lead Channel Setting Sensing Sensitivity: 0.9 mV

## 2019-09-28 DIAGNOSIS — Z23 Encounter for immunization: Secondary | ICD-10-CM | POA: Diagnosis not present

## 2019-11-02 ENCOUNTER — Telehealth: Payer: Self-pay | Admitting: Cardiology

## 2019-11-02 NOTE — Telephone Encounter (Signed)
Sherrie called back asking to speak with Lilia Pro but the call was disconnected while waiting to speak with Lilia Pro

## 2019-11-02 NOTE — Telephone Encounter (Signed)
Pt c/o Shortness Of Breath: STAT if SOB developed within the last 24 hours or pt is noticeably SOB on the phone  1. Are you currently SOB (can you hear that pt is SOB on the phone)? No   2. How long have you been experiencing SOB? 3-4 days   3. Are you SOB when sitting or when up moving around? Moving Around   4. Are you currently experiencing any other symptoms? No    Sherrie is calling stating Denahi has been experiencing SOB for the past three to four days. It only occurs when he is moving around and no other symptoms occur. Sherrie is wanting Koron worked in anytime between today and Architectural technologist. Please advise.

## 2019-11-02 NOTE — Telephone Encounter (Signed)
I left a message on Sherries voicemail to please call us back.

## 2019-11-02 NOTE — Telephone Encounter (Signed)
Spoke with patients daughter Dondra Spry and got the patient scheduled to see Dr. Harriet Masson in [facility] tomorrow at 4:15. No other issues or concerns were voiced at this time.    Encouraged patient to call back with any questions or concerns.

## 2019-11-03 ENCOUNTER — Ambulatory Visit (INDEPENDENT_AMBULATORY_CARE_PROVIDER_SITE_OTHER): Payer: Medicare Other | Admitting: Cardiology

## 2019-11-03 ENCOUNTER — Other Ambulatory Visit: Payer: Self-pay

## 2019-11-03 ENCOUNTER — Encounter: Payer: Self-pay | Admitting: Cardiology

## 2019-11-03 VITALS — BP 148/84 | HR 76 | Ht 67.0 in | Wt 150.0 lb

## 2019-11-03 DIAGNOSIS — I110 Hypertensive heart disease with heart failure: Secondary | ICD-10-CM

## 2019-11-03 DIAGNOSIS — R0602 Shortness of breath: Secondary | ICD-10-CM | POA: Diagnosis not present

## 2019-11-03 DIAGNOSIS — Z950 Presence of cardiac pacemaker: Secondary | ICD-10-CM

## 2019-11-03 DIAGNOSIS — R5383 Other fatigue: Secondary | ICD-10-CM | POA: Diagnosis not present

## 2019-11-03 DIAGNOSIS — E039 Hypothyroidism, unspecified: Secondary | ICD-10-CM

## 2019-11-03 DIAGNOSIS — I25119 Atherosclerotic heart disease of native coronary artery with unspecified angina pectoris: Secondary | ICD-10-CM

## 2019-11-03 NOTE — Progress Notes (Signed)
Cardiology Office Note:    Date:  11/03/2019   ID:  Anthony Jensen., DOB [birth year], MRN [id number]  PCP:  [name], MD  Cardiologist:  No primary care provider on file.  Electrophysiologist:  Will [name], MD   Referring MD: [name], MD   The patient is here for a follow up visit.   History of Present Illness:    Anthony Jensen. is a 86 y.o. male with a hx of coronary artery disease history of multiple stents placed in the past, CKD, hypertension, hyperlipidemia, complete heart block status post dual-chamber Medtronic pacemaker.  The patient follows with Dr. Bettina Gavia and last seen by him in February 2021.  Today he is here because he has had some shortness of breath according to his daughter.  Patient noted that early this week he had some shortness of breath but he has felt a lot better.  He denies any chest pain.  He states that he has not had any symptoms for about a day now.  Past Medical History:  Diagnosis Date  . Acquired hypothyroidism 12/20/2016  . ANEMIA 02/21/2010   Qualifier: Diagnosis of  By: Nelson-Smith CMA (AAMA), Dottie    . Benign nodular prostatic hyperplasia 11/21/2014  . Carotid bruit 09/29/2011  . Chronic combined systolic and diastolic heart failure ([place]) 05/21/2015  . Chronic renal insufficiency 09/28/2011  . Coronary artery disease 09/28/2011   Overview:  a.  Exertional chest discomfort/arm pain with dyspnea.    b.  Bare Metal Stent to proximal left circumflex, (09/1998).    c.  Cypher Stent to mid LAD, (06/03/2005).  d.  MRI, (06/10/2010):  demonstrating normal LVEF with evidence of mild--moderate infarct of inferolateral subendocardial region with peri-infarct ischemia in the left circumflex distribution.    e.  Status post cardiac catheterization (06/23/2010) with drug-eluting stent placed at 60-80% lesion in mid-RCA.    f.  Status post cardiac cath (11/14/2010) to evaluate recurrent chest pain/arm pain and external  Adenosine stress nuclear positive for septal ischemia.  Catheterization demonstrated patent stents with no change in coronary anatomy from previous study of 06/23/2010.    . Essential hypertension 12/20/2016  . Gout 09/28/2011  . History of alcohol abuse 09/28/2011  . History of nephrolithiasis 09/28/2011  . History of rheumatic fever 09/28/2011  . History of tobacco abuse 09/28/2011  . Hyperlipidemia 01/14/2017  . Hypertension 09/28/2011  . Mixed dyslipidemia 12/20/2016  . Multinodular thyroid 09/28/2011  . Near syncope 12/20/2016  . Pacemaker reprogramming/check 05/21/2015   Overview:  Dual chamber 12/24/14  . Peptic ulcer disease 09/28/2011  . Presence of stent in coronary artery in patient with coronary artery disease 12/20/2016  . Prostate nodule 11/21/2014  . Sinus node dysfunction ([place]) 12/24/2014    Past Surgical History:  Procedure Laterality Date  . CORONARY ANGIOPLASTY    . PACEMAKER IMPLANT    . PENILE PROSTHESIS IMPLANT    . THYROID SURGERY      Current Medications: Current Meds  Medication Sig  . amLODipine (NORVASC) 5 MG tablet TAKE 1 TABLET EVERY MORNING, TAKE A SECOND TABLET IN THE EVENING ON MONDAY, WEDNESDAY AND FRIDAY (Patient taking differently: Take 5 mg by mouth See admin instructions. Take 5 mg by mouth in the morning daily and an additional 5 mg in the evening only on Mon/Wed/Fri)  . Aspirin Buf,CaCarb-MgCarb-MgO, (BUFFERIN LOW DOSE) 81 MG TABS Take 2 tablets by mouth.  . clopidogrel (PLAVIX) 75 MG tablet Take 75 mg  by mouth daily.  . furosemide (LASIX) 40 MG tablet Take 1 tablet (40 mg total) by mouth daily.  Marland Kitchen levothyroxine (SYNTHROID, LEVOTHROID) 125 MCG tablet Take 125 mcg by mouth daily.   . nebivolol (BYSTOLIC) 5 MG tablet Take 0.5 tablets (2.5 mg total) by mouth daily.  . potassium chloride SA (KLOR-CON) 20 MEQ tablet Take 0.5 tablets (10 mEq total) by mouth daily.  . tamsulosin (FLOMAX) 0.4 MG CAPS capsule Take 0.4 mg by mouth.     Allergies:   Lorazepam and  Crestor [rosuvastatin calcium]   Social History   Socioeconomic History  . Marital status: Widowed    Spouse name: Not on file  . Number of children: Not on file  . Years of education: Not on file  . Highest education level: Not on file  Occupational History  . Not on file  Tobacco Use  . Smoking status: Former Smoker    Types: Cigarettes  . Smokeless tobacco: Never Used  Substance and Sexual Activity  . Alcohol use: No  . Drug use: No  . Sexual activity: Not Currently  Other Topics Concern  . Not on file  Social History Narrative  . Not on file   Social Determinants of Health   Financial Resource Strain:   . Difficulty of Paying Living Expenses:   Food Insecurity:   . Worried About Charity fundraiser in the Last Year:   . Arboriculturist in the Last Year:   Transportation Needs:   . Film/video editor (Medical):   Marland Kitchen Lack of Transportation (Non-Medical):   Physical Activity:   . Days of Exercise per Week:   . Minutes of Exercise per Session:   Stress:   . Feeling of Stress :   Social Connections:   . Frequency of Communication with Friends and Family:   . Frequency of Social Gatherings with Friends and Family:   . Attends Religious Services:   . Active Member of Clubs or Organizations:   . Attends Archivist Meetings:   Marland Kitchen Marital Status:      Family History: The patient's family history includes CAD in his father.  ROS:   Review of Systems  Constitution: Negative for decreased appetite, fever and weight gain.  HENT: Negative for congestion, ear discharge, hoarse voice and sore throat.   Eyes: Negative for discharge, redness, vision loss in right eye and visual halos.  Cardiovascular: Reports shortness of breath.  Negative for chest pain,leg swelling, orthopnea and palpitations.  Respiratory: Negative for cough, hemoptysis, shortness of breath and snoring.   Endocrine: Negative for heat intolerance and polyphagia.  Hematologic/Lymphatic:  Negative for bleeding problem. Does not bruise/bleed easily.  Skin: Negative for flushing, nail changes, rash and suspicious lesions.  Musculoskeletal: Negative for arthritis, joint pain, muscle cramps, myalgias, neck pain and stiffness.  Gastrointestinal: Negative for abdominal pain, bowel incontinence, diarrhea and excessive appetite.  Genitourinary: Negative for decreased libido, genital sores and incomplete emptying.  Neurological: Negative for brief paralysis, focal weakness, headaches and loss of balance.  Psychiatric/Behavioral: Negative for altered mental status, depression and suicidal ideas.  Allergic/Immunologic: Negative for HIV exposure and persistent infections.    EKGs/Labs/Other Studies Reviewed:    The following studies were reviewed today:   EKG: None today  Transthoracic echocardiogram 08/21/2018 1. The left ventricle has severely reduced systolic function, with an  ejection fraction of 25-30%. The cavity size was normal. Left ventricular diastolic Doppler parameters are consistent with impaired relaxation Elevated left ventricular end-diastolic  pressure.  2. There is akinesis of the apical septal, anterior, inferior and apical left ventricular segments.  3. There is akinesis of the mid anteroseptal and inferoseptal left ventricular segments.  4. The right ventricle has normal systolic function. The cavity was normal. There is no increase in right ventricular wall thickness.  5. The mitral valve is normal in structure.  6. The tricuspid valve is normal in structure.  7. The aortic valve is tricuspid Mild sclerosis of the aortic valve.  8. The pulmonic valve was normal in structure. Pulmonic valve regurgitation is mild by color flow Doppler.  9. Right atrial pressure is estimated at 10 mmHg.   Recent Labs: 05/27/2019: B Natriuretic Peptide 982.3; Hemoglobin 11.1; Platelets 163 08/14/2019: ALT 6; BUN 23; Creatinine, Ser 1.93; NT-Pro BNP 1,537; Potassium 4.0;  Sodium 142; TSH 148.000  Recent Lipid Panel    Component Value Date/Time   CHOL 191 08/21/2018 0419   CHOL 215 (H) 06/20/2018 1645   TRIG 85 08/21/2018 0419   HDL 58 08/21/2018 0419   HDL 70 06/20/2018 1645   CHOLHDL 3.3 08/21/2018 0419   VLDL 17 08/21/2018 0419   LDLCALC 116 (H) 08/21/2018 0419   LDLCALC 117 (H) 06/20/2018 1645    Physical Exam:    VS:  BP (!) 148/84   Pulse 76   Ht 5\' 7"  (1.702 m)   Wt 150 lb (68 kg)   SpO2 97%   BMI 23.49 kg/m     Wt Readings from Last 3 Encounters:  11/03/19 150 lb (68 kg)  08/14/19 155 lb (70.3 kg)  05/26/19 150 lb (68 kg)     GEN: Well nourished, well developed in no acute distress HEENT: Normal NECK: No JVD; No carotid bruits LYMPHATICS: No lymphadenopathy CARDIAC: S1S2 noted,RRR, no murmurs, rubs, gallops RESPIRATORY:  Clear to auscultation without rales, wheezing or rhonchi  ABDOMEN: Soft, non-tender, non-distended, +bowel sounds, no guarding. EXTREMITIES: No edema, No cyanosis, no clubbing MUSCULOSKELETAL:  No deformity  SKIN: Warm and dry NEUROLOGIC:  Alert and oriented x 3, non-focal PSYCHIATRIC:  Normal affect, good insight  ASSESSMENT:    1. Shortness of breath   2. Fatigue, unspecified type   3. Hypothyroidism, unspecified type   4. Hypertensive heart disease with heart failure ([place])   5. Coronary artery disease involving native coronary artery of native heart with angina pectoris ([place])   6. Pacemaker    PLAN:    His symptoms have significant improved.  He adamantly denies any other symptoms.  Clinical exam showed no evidence of edema.  I reviewed his echocardiogram from February 2020 which showed his LVEF is severely depressed.  For now I am going to repeat his echocardiogram to reassess his LVEF and assess for any valvular abnormality.  We will get blood work repeated as well which would include BMP, mag, CBC, TSH and BNP. Suspecting that his thyroid disease may be contributing at this time but we will review  the labs.  For now I do not think he can make evaluation is indicated however we will reassess the need after his echocardiogram as well as if his symptoms recur and persist.  CAD-continue patient on his aspirin and Plavix.  Hypertension-we will continue his current medication regimen.  According to the patient and his daughter his blood pressure is slightly lower at home therefore I will not add any additional antihypertensive medication because I would like to avoid any hypotension in this frail elderly male.  His recent pacemaker check was September 15, 2019.  Pertinent kidney disease-avoid nephrotoxins  The patient is in agreement with the above plan. The patient left the office in stable condition.  The patient will follow up in 1 month with Dr. Bettina Gavia   Medication Adjustments/Labs and Tests Ordered: Current medicines are reviewed at length with the patient today.  Concerns regarding medicines are outlined above.  Orders Placed This Encounter  Procedures  . TSH  . Basic metabolic panel  . Magnesium  . CBC  . Pro b natriuretic peptide (BNP)  . ECHOCARDIOGRAM COMPLETE   No orders of the defined types were placed in this encounter.   Patient Instructions  Medication Instructions:  Your physician recommends that you continue on your current medications as directed. Please refer to the Current Medication list given to you today.  *If you need a refill on your cardiac medications before your next appointment, please call your pharmacy*   Lab Work: Your physician recommends that you return for lab work today: bmp, mg, cbc, bnp, tsh  If you have labs (blood work) drawn today and your tests are completely normal, you will receive your results only by: Marland Kitchen MyChart Message (if you have MyChart) OR . A paper copy in the mail If you have any lab test that is abnormal or we need to change your treatment, we will call you to review the results.   Testing/Procedures: Your physician has  requested that you have an echocardiogram. Echocardiography is a painless test that uses sound waves to create images of your heart. It provides your doctor with information about the size and shape of your heart and how well your heart's chambers and valves are working. This procedure takes approximately one hour. There are no restrictions for this procedure.     Follow-Up: At [facility], you and your health needs are our priority.  As part of our continuing mission to provide you with exceptional heart care, we have created designated Provider Care Teams.  These Care Teams include your primary Cardiologist (physician) and Advanced Practice Providers (APPs -  Physician Assistants and Nurse Practitioners) who all work together to provide you with the care you need, when you need it.  We recommend signing up for the patient portal called "MyChart".  Sign up information is provided on this After Visit Summary.  MyChart is used to connect with patients for Virtual Visits (Telemedicine).  Patients are able to view lab/test results, encounter notes, upcoming appointments, etc.  Non-urgent messages can be sent to your provider as well.   To learn more about what you can do with MyChart, go to [email or web address].    Your next appointment:   1 month(s)  The format for your next appointment:   In Person  Provider:   Shirlee More, MD   Other Instructions   Echocardiogram An echocardiogram is a procedure that uses painless sound waves (ultrasound) to produce an image of the heart. Images from an echocardiogram can provide important information about:  Signs of coronary artery disease (CAD).  Aneurysm detection. An aneurysm is a weak or damaged part of an artery wall that bulges out from the normal force of blood pumping through the body.  Heart size and shape. Changes in the size or shape of the heart can be associated with certain conditions, including heart failure, aneurysm, and  CAD.  Heart muscle function.  Heart valve function.  Signs of a past heart attack.  Fluid buildup around the heart.  Thickening of the heart muscle.  A  tumor or infectious growth around the heart valves. Tell a health care provider about:  Any allergies you have.  All medicines you are taking, including vitamins, herbs, eye drops, creams, and over-the-counter medicines.  Any blood disorders you have.  Any surgeries you have had.  Any medical conditions you have.  Whether you are pregnant or may be pregnant. What are the risks? Generally, this is a safe procedure. However, problems may occur, including:  Allergic reaction to dye (contrast) that may be used during the procedure. What happens before the procedure? No specific preparation is needed. You may eat and drink normally. What happens during the procedure?   An IV tube may be inserted into one of your veins.  You may receive contrast through this tube. A contrast is an injection that improves the quality of the pictures from your heart.  A gel will be applied to your chest.  A wand-like tool (transducer) will be moved over your chest. The gel will help to transmit the sound waves from the transducer.  The sound waves will harmlessly bounce off of your heart to allow the heart images to be captured in real-time motion. The images will be recorded on a computer. The procedure may vary among health care providers and hospitals. What happens after the procedure?  You may return to your normal, everyday life, including diet, activities, and medicines, unless your health care provider tells you not to do that. Summary  An echocardiogram is a procedure that uses painless sound waves (ultrasound) to produce an image of the heart.  Images from an echocardiogram can provide important information about the size and shape of your heart, heart muscle function, heart valve function, and fluid buildup around your  heart.  You do not need to do anything to prepare before this procedure. You may eat and drink normally.  After the echocardiogram is completed, you may return to your normal, everyday life, unless your health care provider tells you not to do that. This information is not intended to replace advice given to you by your health care provider. Make sure you discuss any questions you have with your health care provider. Document Revised: 10/13/2018 Document Reviewed: 07/25/2016 Elsevier Patient Education  [place].      Adopting a Healthy Lifestyle.  Know what a healthy weight is for you (roughly BMI <25) and aim to maintain this   Aim for 7+ servings of fruits and vegetables daily   65-80+ fluid ounces of water or unsweet tea for healthy kidneys   Limit to max 1 drink of alcohol per day; avoid smoking/tobacco   Limit animal fats in diet for cholesterol and heart health - choose grass fed whenever available   Avoid highly processed foods, and foods high in saturated/trans fats   Aim for low stress - take time to unwind and care for your mental health   Aim for 150 min of moderate intensity exercise weekly for heart health, and weights twice weekly for bone health   Aim for 7-9 hours of sleep daily   When it comes to diets, agreement about the perfect plan isnt easy to find, even among the experts. Experts at the [place] [employer] developed an idea known as the Healthy Eating Plate. Just imagine a plate divided into logical, healthy portions.   The emphasis is on diet quality:   Load up on vegetables and fruits - one-half of your plate: Aim for color and variety, and remember that potatoes  dont count.   Go for whole grains - one-quarter of your plate: Whole wheat, barley, wheat berries, quinoa, oats, brown rice, and foods made with them. If you want pasta, go with whole wheat pasta.   Protein power - one-quarter of your plate: Fish, chicken, beans, and  nuts are all healthy, versatile protein sources. Limit red meat.   The diet, however, does go beyond the plate, offering a few other suggestions.   Use healthy plant oils, such as olive, canola, soy, corn, sunflower and peanut. Check the labels, and avoid partially hydrogenated oil, which have unhealthy trans fats.   If youre thirsty, drink water. Coffee and tea are good in moderation, but skip sugary drinks and limit milk and dairy products to one or two daily servings.   The type of carbohydrate in the diet is more important than the amount. Some sources of carbohydrates, such as vegetables, fruits, whole grains, and beans-are healthier than others.   Finally, stay active  Signed, Berniece Salines, DO  11/03/2019 4:51 PM    [facility]

## 2019-11-03 NOTE — Patient Instructions (Signed)
Medication Instructions:  Your physician recommends that you continue on your current medications as directed. Please refer to the Current Medication list given to you today.  *If you need a refill on your cardiac medications before your next appointment, please call your pharmacy*   Lab Work: Your physician recommends that you return for lab work today: bmp, mg, cbc, bnp, tsh  If you have labs (blood work) drawn today and your tests are completely normal, you will receive your results only by: Marland Kitchen MyChart Message (if you have MyChart) OR . A paper copy in the mail If you have any lab test that is abnormal or we need to change your treatment, we will call you to review the results.   Testing/Procedures: Your physician has requested that you have an echocardiogram. Echocardiography is a painless test that uses sound waves to create images of your heart. It provides your doctor with information about the size and shape of your heart and how well your heart's chambers and valves are working. This procedure takes approximately one hour. There are no restrictions for this procedure.     Follow-Up: At [facility], you and your health needs are our priority.  As part of our continuing mission to provide you with exceptional heart care, we have created designated Provider Care Teams.  These Care Teams include your primary Cardiologist (physician) and Advanced Practice Providers (APPs -  Physician Assistants and Nurse Practitioners) who all work together to provide you with the care you need, when you need it.  We recommend signing up for the patient portal called "MyChart".  Sign up information is provided on this After Visit Summary.  MyChart is used to connect with patients for Virtual Visits (Telemedicine).  Patients are able to view lab/test results, encounter notes, upcoming appointments, etc.  Non-urgent messages can be sent to your provider as well.   To learn more about what you can do with  MyChart, go to [email or web address].    Your next appointment:   1 month(s)  The format for your next appointment:   In Person  Provider:   Shirlee More, MD   Other Instructions   Echocardiogram An echocardiogram is a procedure that uses painless sound waves (ultrasound) to produce an image of the heart. Images from an echocardiogram can provide important information about:  Signs of coronary artery disease (CAD).  Aneurysm detection. An aneurysm is a weak or damaged part of an artery wall that bulges out from the normal force of blood pumping through the body.  Heart size and shape. Changes in the size or shape of the heart can be associated with certain conditions, including heart failure, aneurysm, and CAD.  Heart muscle function.  Heart valve function.  Signs of a past heart attack.  Fluid buildup around the heart.  Thickening of the heart muscle.  A tumor or infectious growth around the heart valves. Tell a health care provider about:  Any allergies you have.  All medicines you are taking, including vitamins, herbs, eye drops, creams, and over-the-counter medicines.  Any blood disorders you have.  Any surgeries you have had.  Any medical conditions you have.  Whether you are pregnant or may be pregnant. What are the risks? Generally, this is a safe procedure. However, problems may occur, including:  Allergic reaction to dye (contrast) that may be used during the procedure. What happens before the procedure? No specific preparation is needed. You may eat and drink normally. What happens during the procedure?  An IV tube may be inserted into one of your veins.  You may receive contrast through this tube. A contrast is an injection that improves the quality of the pictures from your heart.  A gel will be applied to your chest.  A wand-like tool (transducer) will be moved over your chest. The gel will help to transmit the sound waves from the  transducer.  The sound waves will harmlessly bounce off of your heart to allow the heart images to be captured in real-time motion. The images will be recorded on a computer. The procedure may vary among health care providers and hospitals. What happens after the procedure?  You may return to your normal, everyday life, including diet, activities, and medicines, unless your health care provider tells you not to do that. Summary  An echocardiogram is a procedure that uses painless sound waves (ultrasound) to produce an image of the heart.  Images from an echocardiogram can provide important information about the size and shape of your heart, heart muscle function, heart valve function, and fluid buildup around your heart.  You do not need to do anything to prepare before this procedure. You may eat and drink normally.  After the echocardiogram is completed, you may return to your normal, everyday life, unless your health care provider tells you not to do that. This information is not intended to replace advice given to you by your health care provider. Make sure you discuss any questions you have with your health care provider. Document Revised: 10/13/2018 Document Reviewed: 07/25/2016 Elsevier Patient Education  [place].

## 2019-11-04 LAB — BASIC METABOLIC PANEL
BUN/Creatinine Ratio: 13 (ref 10–24)
BUN: 26 mg/dL (ref 10–36)
CO2: 23 mmol/L (ref 20–29)
Calcium: 9.2 mg/dL (ref 8.6–10.2)
Chloride: 99 mmol/L (ref 96–106)
Creatinine, Ser: 1.93 mg/dL — ABNORMAL HIGH (ref 0.76–1.27)
GFR calc Af Amer: 34 mL/min/{1.73_m2} — ABNORMAL LOW (ref >59)
GFR calc non Af Amer: 29 mL/min/{1.73_m2} — ABNORMAL LOW (ref >59)
Glucose: 85 mg/dL (ref 65–99)
Potassium: 4 mmol/L (ref 3.5–5.2)
Sodium: 136 mmol/L (ref 134–144)

## 2019-11-04 LAB — PRO B NATRIURETIC PEPTIDE: NT-Pro BNP: 4450 pg/mL — ABNORMAL HIGH (ref 0–486)

## 2019-11-04 LAB — CBC
Hematocrit: 35.7 % — ABNORMAL LOW (ref 37.5–51.0)
Hemoglobin: 11.5 g/dL — ABNORMAL LOW (ref 13.0–17.7)
MCH: 27.7 pg (ref 26.6–33.0)
MCHC: 32.2 g/dL (ref 31.5–35.7)
MCV: 86 fL (ref 79–97)
Platelets: 191 10*3/uL (ref 150–450)
RBC: 4.15 x10E6/uL (ref 4.14–5.80)
RDW: 14.3 % (ref 11.6–15.4)
WBC: 6 10*3/uL (ref 3.4–10.8)

## 2019-11-04 LAB — TSH: TSH: 57.2 u[IU]/mL — ABNORMAL HIGH (ref 0.450–4.500)

## 2019-11-04 LAB — MAGNESIUM: Magnesium: 2.1 mg/dL (ref 1.6–2.3)

## 2019-11-06 ENCOUNTER — Telehealth: Payer: Self-pay

## 2019-11-06 NOTE — Telephone Encounter (Signed)
-----   Message from Berniece Salines, DO sent at 11/06/2019 12:40 PM EDT ----- His TSH is still elevated, he needs to discuss with his pcp about adjusting his thyroid medications. Have his take an extra dose of lasix tomorrow and Thursday.

## 2019-11-06 NOTE — Telephone Encounter (Signed)
Left message on patients voicemail to please return our call.   

## 2019-11-06 NOTE — Telephone Encounter (Signed)
Spoke with patient regarding results and recommendation.  Patient verbalizes understanding and is agreeable to plan of care. Advised patient to call back with any issues or concerns.  

## 2019-11-06 NOTE — Telephone Encounter (Signed)
Patient's caregiver is returning Morgan's call.

## 2019-11-09 ENCOUNTER — Other Ambulatory Visit: Payer: Self-pay

## 2019-11-09 ENCOUNTER — Ambulatory Visit (HOSPITAL_BASED_OUTPATIENT_CLINIC_OR_DEPARTMENT_OTHER)
Admission: RE | Admit: 2019-11-09 | Discharge: 2019-11-09 | Disposition: A | Discharge status: Home / Self Care | Payer: Medicare Other | Source: Ambulatory Visit | Attending: Cardiology | Admitting: Cardiology

## 2019-11-09 DIAGNOSIS — R0602 Shortness of breath: Secondary | ICD-10-CM | POA: Diagnosis not present

## 2019-11-09 DIAGNOSIS — I110 Hypertensive heart disease with heart failure: Secondary | ICD-10-CM | POA: Diagnosis not present

## 2019-11-09 NOTE — Progress Notes (Signed)
  Echocardiogram 2D Echocardiogram has been performed.  Anthony Jensen 11/09/2019, 2:44 PM

## 2019-11-10 ENCOUNTER — Telehealth: Payer: Self-pay

## 2019-11-10 NOTE — Telephone Encounter (Signed)
Left message on patients voicemail to please return our call.   

## 2019-11-10 NOTE — Telephone Encounter (Signed)
Sherrie returning [employer].

## 2019-11-10 NOTE — Telephone Encounter (Signed)
Spoke with patients daughter regarding results and recommendation.  She verbalizes understanding and is agreeable to plan of care. Advised patient/daughter to call back with any issues or concerns.  

## 2019-11-10 NOTE — Telephone Encounter (Signed)
-----   Message from Berniece Salines, DO sent at 11/10/2019  2:18 PM EDT ----- Your EF is about the same good news is it did not diminish.  If anything it came up a little slightly from 25 to 30% to 30 to 35%.

## 2019-11-23 ENCOUNTER — Telehealth: Payer: Self-pay

## 2019-11-23 NOTE — Telephone Encounter (Signed)
Left message for patient to remind of missed remote transmission.  

## 2019-12-13 NOTE — Progress Notes (Deleted)
Cardiology Office Note:    Date:  12/13/2019   ID:  Anthony Pew [name], DOB [birth year], MRN [id number]  PCP:  [name], MD  Cardiologist:  Shirlee More, MD    Referring MD: [name], MD    ASSESSMENT:    No diagnosis found. PLAN:    In order of problems listed above:  1. ***   Next appointment: ***   Medication Adjustments/Labs and Tests Ordered: Current medicines are reviewed at length with the patient today.  Concerns regarding medicines are outlined above.  No orders of the defined types were placed in this encounter.  No orders of the defined types were placed in this encounter.   No chief complaint on file.   History of Present Illness:    Anthony Cass. is a 86 y.o. male with a hx of coronary artery disease history of multiple stents placed in the past, CKD, hypertension, hyperlipidemia, complete heart block status post dual-chamber Medtronic pacemaker  last seen by Dr. Harriet Masson.  His proBNP level was quite elevated Compliance with diet, lifestyle and medications: ***   Ref Range & Units 1 mo ago 4 mo ago 1 yr ago  NT-Pro BNP 0 - 486 pg/mL 4,450High   1,537High  CM  CANCELED    Echocardiogram performed 11/09/2019 showed ejection fraction 30 to 35% I independently reviewed the echocardiogram and agree with the impressions and he has some degree of asynchrony and he is ventricularly paced about 65% of the time.  His previous echocardiogram 08/21/2018 showed ejection fraction 25 to 30%. Past Medical History:  Diagnosis Date  . Acquired hypothyroidism 12/20/2016  . ANEMIA 02/21/2010   Qualifier: Diagnosis of  By: Nelson-Smith CMA (AAMA), Dottie    . Benign nodular prostatic hyperplasia 11/21/2014  . Carotid bruit 09/29/2011  . Chronic combined systolic and diastolic heart failure ([place]) 05/21/2015  . Chronic renal insufficiency 09/28/2011  . Coronary artery disease 09/28/2011   Overview:  a.  Exertional chest discomfort/arm pain with  dyspnea.    b.  Bare Metal Stent to proximal left circumflex, (09/1998).    c.  Cypher Stent to mid LAD, (06/03/2005).  d.  MRI, (06/10/2010):  demonstrating normal LVEF with evidence of mild--moderate infarct of inferolateral subendocardial region with peri-infarct ischemia in the left circumflex distribution.    e.  Status post cardiac catheterization (06/23/2010) with drug-eluting stent placed at 60-80% lesion in mid-RCA.    f.  Status post cardiac cath (11/14/2010) to evaluate recurrent chest pain/arm pain and external Adenosine stress nuclear positive for septal ischemia.  Catheterization demonstrated patent stents with no change in coronary anatomy from previous study of 06/23/2010.    . Essential hypertension 12/20/2016  . Gout 09/28/2011  . History of alcohol abuse 09/28/2011  . History of nephrolithiasis 09/28/2011  . History of rheumatic fever 09/28/2011  . History of tobacco abuse 09/28/2011  . Hyperlipidemia 01/14/2017  . Hypertension 09/28/2011  . Mixed dyslipidemia 12/20/2016  . Multinodular thyroid 09/28/2011  . Near syncope 12/20/2016  . Pacemaker reprogramming/check 05/21/2015   Overview:  Dual chamber 12/24/14  . Peptic ulcer disease 09/28/2011  . Presence of stent in coronary artery in patient with coronary artery disease 12/20/2016  . Prostate nodule 11/21/2014  . Sinus node dysfunction ([place]) 12/24/2014    Past Surgical History:  Procedure Laterality Date  . CORONARY ANGIOPLASTY    . PACEMAKER IMPLANT    . PENILE PROSTHESIS IMPLANT    . THYROID SURGERY  Current Medications: No outpatient medications have been marked as taking for the 12/14/19 encounter (Appointment) with Richardo Priest, MD.     Allergies:   Lorazepam and Crestor [rosuvastatin calcium]   Social History   Socioeconomic History  . Marital status: Widowed    Spouse name: Not on file  . Number of children: Not on file  . Years of education: Not on file  . Highest education level: Not on file  Occupational  History  . Not on file  Tobacco Use  . Smoking status: Former Smoker    Types: Cigarettes  . Smokeless tobacco: Never Used  Substance and Sexual Activity  . Alcohol use: No  . Drug use: No  . Sexual activity: Not Currently  Other Topics Concern  . Not on file  Social History Narrative  . Not on file   Social Determinants of Health   Financial Resource Strain:   . Difficulty of Paying Living Expenses:   Food Insecurity:   . Worried About Charity fundraiser in the Last Year:   . Arboriculturist in the Last Year:   Transportation Needs:   . Film/video editor (Medical):   Marland Kitchen Lack of Transportation (Non-Medical):   Physical Activity:   . Days of Exercise per Week:   . Minutes of Exercise per Session:   Stress:   . Feeling of Stress :   Social Connections:   . Frequency of Communication with Friends and Family:   . Frequency of Social Gatherings with Friends and Family:   . Attends Religious Services:   . Active Member of Clubs or Organizations:   . Attends Archivist Meetings:   Marland Kitchen Marital Status:      Family History: The patient's ***family history includes CAD in his father. ROS:   Please see the history of present illness.    All other systems reviewed and are negative.  EKGs/Labs/Other Studies Reviewed:    The following studies were reviewed today:  EKG:  EKG ordered today and personally reviewed.  The ekg ordered today demonstrates ***  Recent Labs: 05/27/2019: B Natriuretic Peptide 982.3 08/14/2019: ALT 6 11/03/2019: BUN 26; Creatinine, Ser 1.93; Hemoglobin 11.5; Magnesium 2.1; NT-Pro BNP 4,450; Platelets 191; Potassium 4.0; Sodium 136; TSH 57.200  Recent Lipid Panel    Component Value Date/Time   CHOL 191 08/21/2018 0419   CHOL 215 (H) 06/20/2018 1645   TRIG 85 08/21/2018 0419   HDL 58 08/21/2018 0419   HDL 70 06/20/2018 1645   CHOLHDL 3.3 08/21/2018 0419   VLDL 17 08/21/2018 0419   LDLCALC 116 (H) 08/21/2018 0419   LDLCALC 117 (H)  06/20/2018 1645    Physical Exam:    VS:  There were no vitals taken for this visit.    Wt Readings from Last 3 Encounters:  11/03/19 150 lb (68 kg)  08/14/19 155 lb (70.3 kg)  05/26/19 150 lb (68 kg)     GEN: *** Well nourished, well developed in no acute distress HEENT: Normal NECK: No JVD; No carotid bruits LYMPHATICS: No lymphadenopathy CARDIAC: ***RRR, no murmurs, rubs, gallops RESPIRATORY:  Clear to auscultation without rales, wheezing or rhonchi  ABDOMEN: Soft, non-tender, non-distended MUSCULOSKELETAL:  No edema; No deformity  SKIN: Warm and dry NEUROLOGIC:  Alert and oriented x 3 PSYCHIATRIC:  Normal affect    Signed, Shirlee More, MD  12/13/2019 2:00 PM    [place]

## 2019-12-14 ENCOUNTER — Ambulatory Visit: Payer: Medicare Other | Admitting: Cardiology

## 2020-01-15 NOTE — Progress Notes (Unsigned)
Cardiology Office Note:    Date:  01/15/2020   ID:  Anthony Pew [name], DOB 1926/01/05, MRN [id number]  PCP:  [name], MD  Cardiologist:  Shirlee More, MD    Referring MD: [name], MD    ASSESSMENT:    No diagnosis found. PLAN:    In order of problems listed above:  1. ***   Next appointment: ***   Medication Adjustments/Labs and Tests Ordered: Current medicines are reviewed at length with the patient today.  Concerns regarding medicines are outlined above.  No orders of the defined types were placed in this encounter.  No orders of the defined types were placed in this encounter.   No chief complaint on file.   History of Present Illness:    Anthony Diodato. is a 86 y.o. male with a hx of coronary artery disease history of multiple stents placed in the past, CKD, hypertension, hyperlipidemia, complete heart block status post dual-chamber Medtronic pacemaker  last seen 10/07/2019 with increased dyspnea.  He also has stage IV CKD.  His last device check 09/15/2019 showed normal pacemaker parameters and function he had one 7 beat run of PVCs noted.  He is atrially paced 93% of the time and ventricularly paced approximately 70% of the time.  He received TPA at that time.  Compliance with diet, lifestyle and medications: ***   Ref Range & Units 2 mo ago 5 mo ago 1 yr ago  NT-Pro BNP 0 - 486 pg/mL 4,450High  1,537High CM  CANCELED    Echo 11/09/2019: Paradoxical septal motion and overall LV dysynchrony most likely  secondary to IVCD. Left ventricular ejection fraction, by estimation, is  30 to 35%. The left ventricle has moderately globally decreased function.   Ref Range & Units 2 mo ago 5 mo ago 7 mo ago  Glucose 65 - 99 mg/dL 85  107High  116High R   BUN 10 - 36 mg/dL 26  23  24High R   Creatinine, Ser 0.76 - 1.27 mg/dL 1.93High  1.93High  1.81High R   GFR calc non Af Amer >59 mL/min/1.73 29Low  30Low   32Low R     Past Medical History:  Diagnosis Date  . Acquired hypothyroidism 12/20/2016  . ANEMIA 02/21/2010   Qualifier: Diagnosis of  By: Nelson-Smith CMA (AAMA), Dottie    . Benign nodular prostatic hyperplasia 11/21/2014  . Carotid bruit 09/29/2011  . Chronic combined systolic and diastolic heart failure ([place]) 05/21/2015  . Chronic renal insufficiency 09/28/2011  . Coronary artery disease 09/28/2011   Overview:  a.  Exertional chest discomfort/arm pain with dyspnea.    b.  Bare Metal Stent to proximal left circumflex, (09/1998).    c.  Cypher Stent to mid LAD, (06/03/2005).  d.  MRI, (06/10/2010):  demonstrating normal LVEF with evidence of mild--moderate infarct of inferolateral subendocardial region with peri-infarct ischemia in the left circumflex distribution.    e.  Status post cardiac catheterization (06/23/2010) with drug-eluting stent placed at 60-80% lesion in mid-RCA.    f.  Status post cardiac cath (11/14/2010) to evaluate recurrent chest pain/arm pain and external Adenosine stress nuclear positive for septal ischemia.  Catheterization demonstrated patent stents with no change in coronary anatomy from previous study of 06/23/2010.    . Essential hypertension 12/20/2016  . Gout 09/28/2011  . History of alcohol abuse 09/28/2011  . History of nephrolithiasis 09/28/2011  . History of rheumatic fever 09/28/2011  . History of tobacco abuse  09/28/2011  . Hyperlipidemia 01/14/2017  . Hypertension 09/28/2011  . Mixed dyslipidemia 12/20/2016  . Multinodular thyroid 09/28/2011  . Near syncope 12/20/2016  . Pacemaker reprogramming/check 05/21/2015   Overview:  Dual chamber 12/24/14  . Peptic ulcer disease 09/28/2011  . Presence of stent in coronary artery in patient with coronary artery disease 12/20/2016  . Prostate nodule 11/21/2014  . Sinus node dysfunction ([place]) 12/24/2014    Past Surgical History:  Procedure Laterality Date  . CORONARY ANGIOPLASTY    . PACEMAKER IMPLANT    . PENILE  PROSTHESIS IMPLANT    . THYROID SURGERY      Current Medications: No outpatient medications have been marked as taking for the 01/16/20 encounter (Appointment) with Richardo Priest, MD.     Allergies:   Lorazepam and Crestor [rosuvastatin calcium]   Social History   Socioeconomic History  . Marital status: Widowed    Spouse name: Not on file  . Number of children: Not on file  . Years of education: Not on file  . Highest education level: Not on file  Occupational History  . Not on file  Tobacco Use  . Smoking status: Former Smoker    Types: Cigarettes  . Smokeless tobacco: Never Used  Vaping Use  . Vaping Use: Never used  Substance and Sexual Activity  . Alcohol use: No  . Drug use: No  . Sexual activity: Not Currently  Other Topics Concern  . Not on file  Social History Narrative  . Not on file   Social Determinants of Health   Financial Resource Strain:   . Difficulty of Paying Living Expenses:   Food Insecurity:   . Worried About Charity fundraiser in the Last Year:   . Arboriculturist in the Last Year:   Transportation Needs:   . Film/video editor (Medical):   Marland Kitchen Lack of Transportation (Non-Medical):   Physical Activity:   . Days of Exercise per Week:   . Minutes of Exercise per Session:   Stress:   . Feeling of Stress :   Social Connections:   . Frequency of Communication with Friends and Family:   . Frequency of Social Gatherings with Friends and Family:   . Attends Religious Services:   . Active Member of Clubs or Organizations:   . Attends Archivist Meetings:   Marland Kitchen Marital Status:      Family History: The patient's ***family history includes CAD in his father. ROS:   Please see the history of present illness.    All other systems reviewed and are negative.  EKGs/Labs/Other Studies Reviewed:    The following studies were reviewed today:  EKG:  EKG ordered today and personally reviewed.  The ekg ordered today demonstrates  ***  Recent Labs: 05/27/2019: B Natriuretic Peptide 982.3 08/14/2019: ALT 6 11/03/2019: BUN 26; Creatinine, Ser 1.93; Hemoglobin 11.5; Magnesium 2.1; NT-Pro BNP 4,450; Platelets 191; Potassium 4.0; Sodium 136; TSH 57.200  Recent Lipid Panel    Component Value Date/Time   CHOL 191 08/21/2018 0419   CHOL 215 (H) 06/20/2018 1645   TRIG 85 08/21/2018 0419   HDL 58 08/21/2018 0419   HDL 70 06/20/2018 1645   CHOLHDL 3.3 08/21/2018 0419   VLDL 17 08/21/2018 0419   LDLCALC 116 (H) 08/21/2018 0419   LDLCALC 117 (H) 06/20/2018 1645    Physical Exam:    VS:  There were no vitals taken for this visit.    Wt Readings from Last 3  Encounters:  11/03/19 150 lb (68 kg)  08/14/19 155 lb (70.3 kg)  05/26/19 150 lb (68 kg)     GEN: *** Well nourished, well developed in no acute distress HEENT: Normal NECK: No JVD; No carotid bruits LYMPHATICS: No lymphadenopathy CARDIAC: ***RRR, no murmurs, rubs, gallops RESPIRATORY:  Clear to auscultation without rales, wheezing or rhonchi  ABDOMEN: Soft, non-tender, non-distended MUSCULOSKELETAL:  No edema; No deformity  SKIN: Warm and dry NEUROLOGIC:  Alert and oriented x 3 PSYCHIATRIC:  Normal affect    Signed, Shirlee More, MD  01/15/2020 8:24 PM    [place]

## 2020-01-16 ENCOUNTER — Ambulatory Visit: Payer: Medicare Other | Admitting: Cardiology

## 2020-05-17 DIAGNOSIS — S0003XA Contusion of scalp, initial encounter: Secondary | ICD-10-CM | POA: Diagnosis not present

## 2020-05-17 DIAGNOSIS — R58 Hemorrhage, not elsewhere classified: Secondary | ICD-10-CM | POA: Diagnosis not present

## 2020-05-17 DIAGNOSIS — R4182 Altered mental status, unspecified: Secondary | ICD-10-CM | POA: Diagnosis not present

## 2020-05-17 DIAGNOSIS — S199XXA Unspecified injury of neck, initial encounter: Secondary | ICD-10-CM | POA: Diagnosis not present

## 2020-05-17 DIAGNOSIS — S0990XA Unspecified injury of head, initial encounter: Secondary | ICD-10-CM | POA: Diagnosis not present

## 2020-05-17 DIAGNOSIS — I6782 Cerebral ischemia: Secondary | ICD-10-CM | POA: Diagnosis not present

## 2020-05-17 DIAGNOSIS — R456 Violent behavior: Secondary | ICD-10-CM | POA: Diagnosis not present

## 2020-05-17 DIAGNOSIS — G319 Degenerative disease of nervous system, unspecified: Secondary | ICD-10-CM | POA: Diagnosis not present

## 2020-05-17 DIAGNOSIS — R0902 Hypoxemia: Secondary | ICD-10-CM | POA: Diagnosis not present

## 2020-05-29 DIAGNOSIS — Z1331 Encounter for screening for depression: Secondary | ICD-10-CM | POA: Diagnosis not present

## 2020-05-29 DIAGNOSIS — Z0000 Encounter for general adult medical examination without abnormal findings: Secondary | ICD-10-CM | POA: Diagnosis not present

## 2020-05-29 DIAGNOSIS — Z79899 Other long term (current) drug therapy: Secondary | ICD-10-CM | POA: Diagnosis not present

## 2020-05-29 DIAGNOSIS — R413 Other amnesia: Secondary | ICD-10-CM | POA: Diagnosis not present

## 2020-05-29 DIAGNOSIS — Z23 Encounter for immunization: Secondary | ICD-10-CM | POA: Diagnosis not present

## 2020-05-29 DIAGNOSIS — Z6824 Body mass index (BMI) 24.0-24.9, adult: Secondary | ICD-10-CM | POA: Diagnosis not present

## 2020-05-29 DIAGNOSIS — E039 Hypothyroidism, unspecified: Secondary | ICD-10-CM | POA: Diagnosis not present

## 2020-05-29 DIAGNOSIS — E559 Vitamin D deficiency, unspecified: Secondary | ICD-10-CM | POA: Diagnosis not present

## 2020-06-30 IMAGING — CT CT HEAD CODE STROKE
4 series · 16 of 47 positions shown, 18 images · non-contrast
Comparison: 09/25/2012

CLINICAL DATA: Code stroke.  Bilateral leg weakness.

EXAM:
CT HEAD WITHOUT CONTRAST
TECHNIQUE: Contiguous axial images were obtained from the base of the skull
through the vertex without intravenous contrast.

[Series 3: head wo · axial · 0.47mm/px · z∈[-144,-24]mm · 7 of 33 slices shown, 9 images]
[im 5/33  brain]
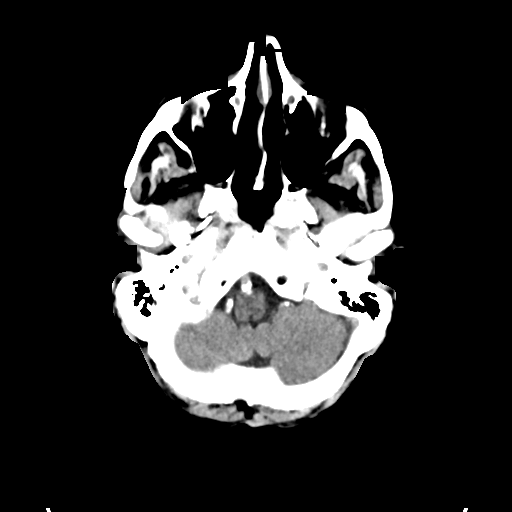
[im 5/33  bone]
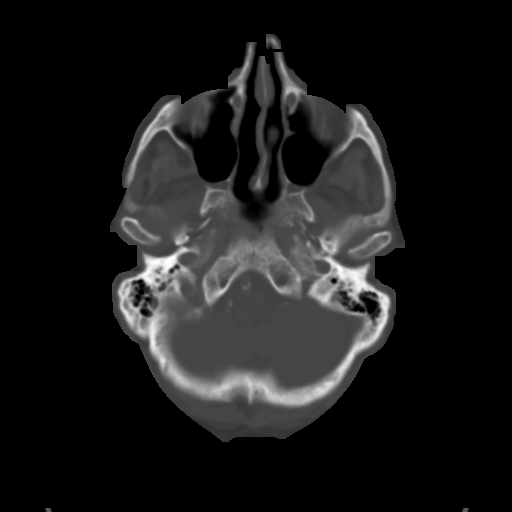
[im 9/33  brain]
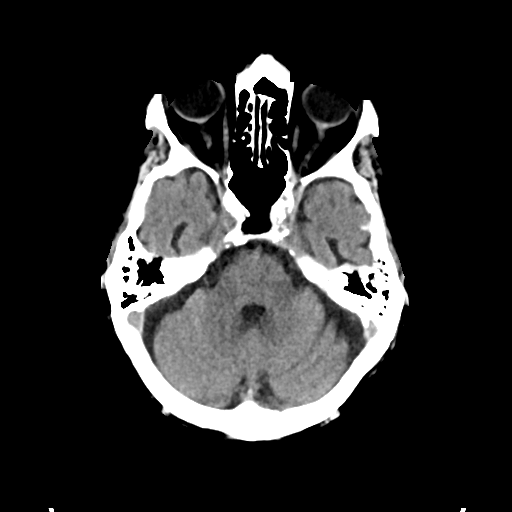
[im 13/33  brain]
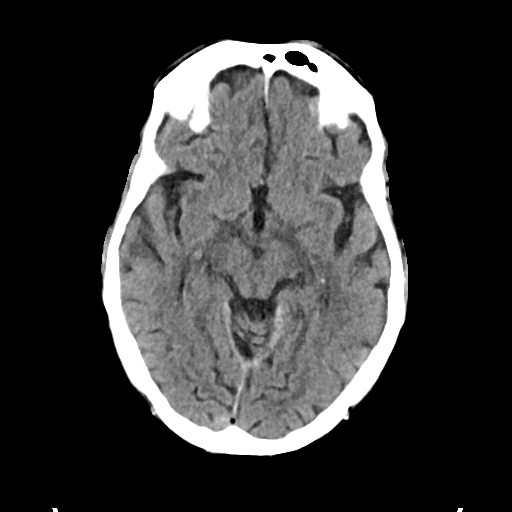
[im 17/33  brain]
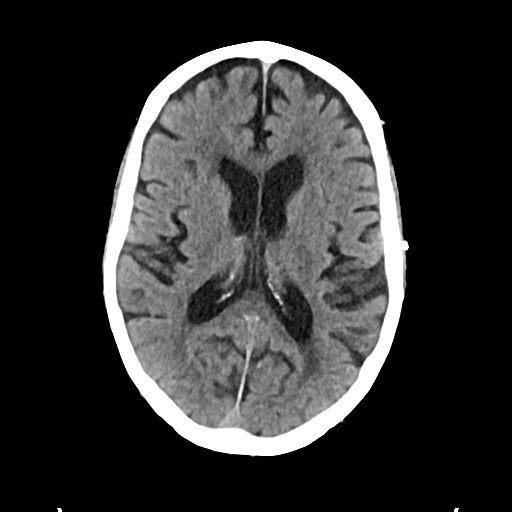
[im 21/33  brain]
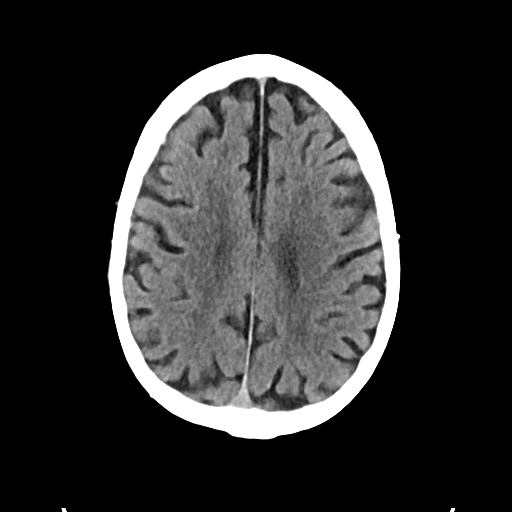
[im 21/33  bone]
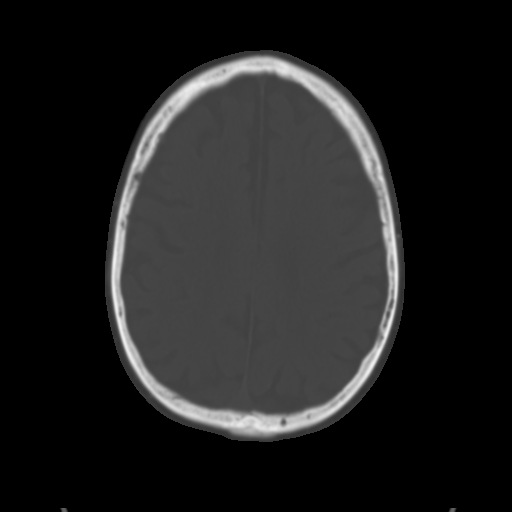
[im 25/33  brain]
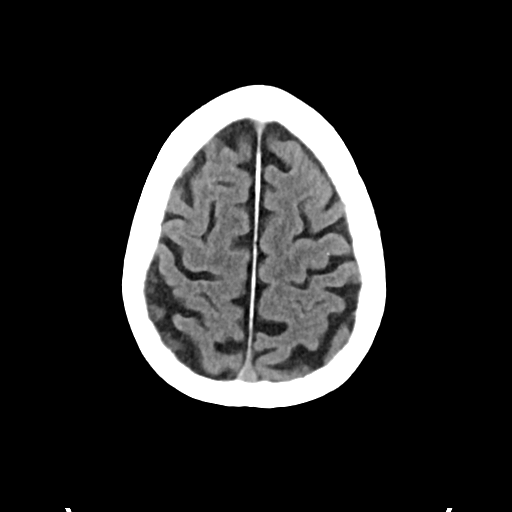
[im 29/33  brain]
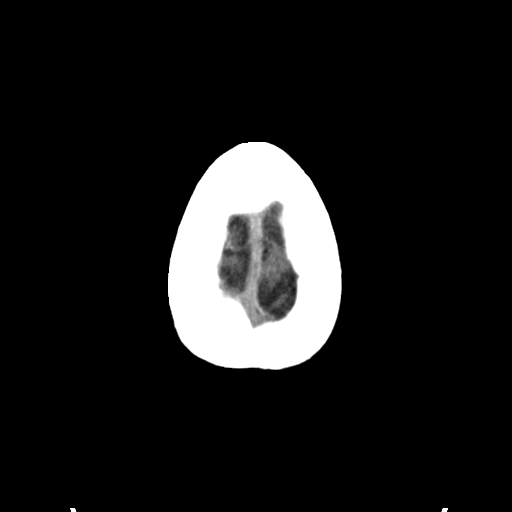

[Series 4: head bone · axial · 0.47mm/px · z∈[-148,-116]mm · 3 of 82 slices shown]
[im 9/82  bone]
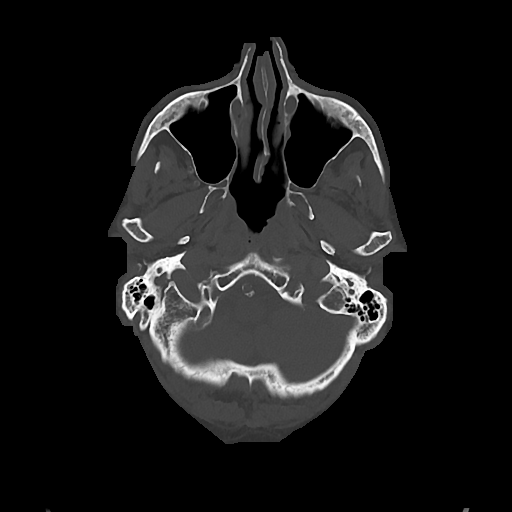
[im 17/82  bone]
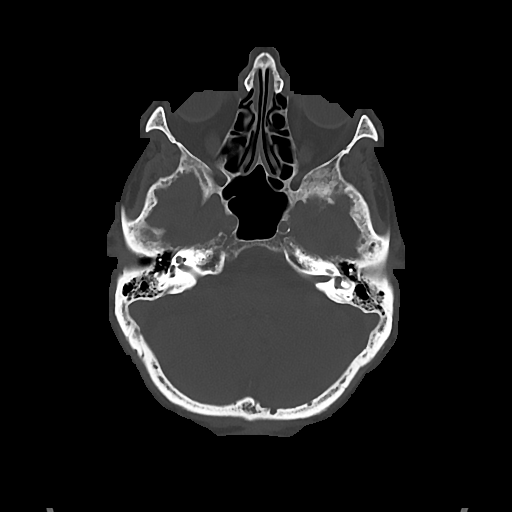
[im 25/82  bone]
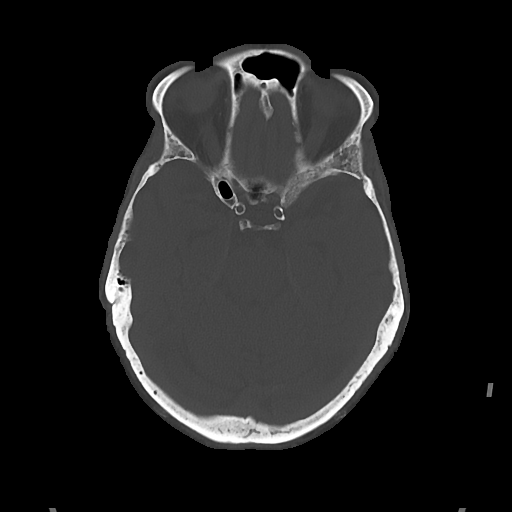

[Series 5: cor soft · coronal · 0.35mm/px · 3 of 66 slices shown]
[im 22/66  brain]
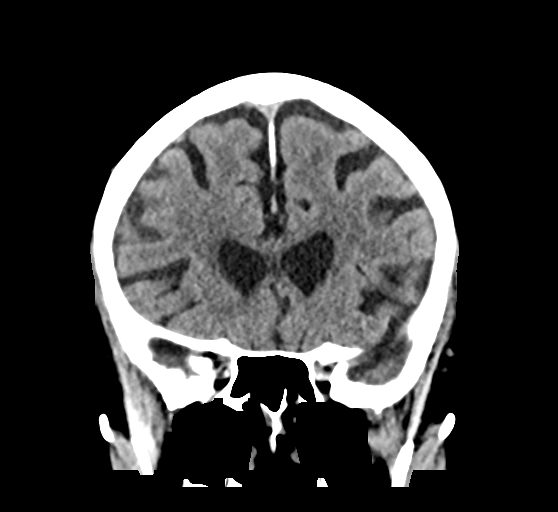
[im 29/66  brain]
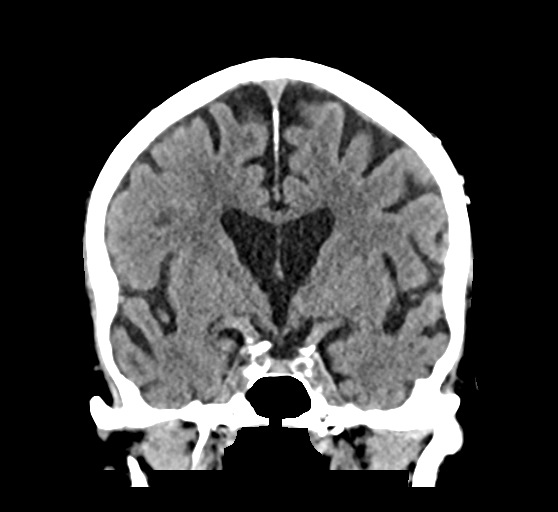
[im 37/66  brain]
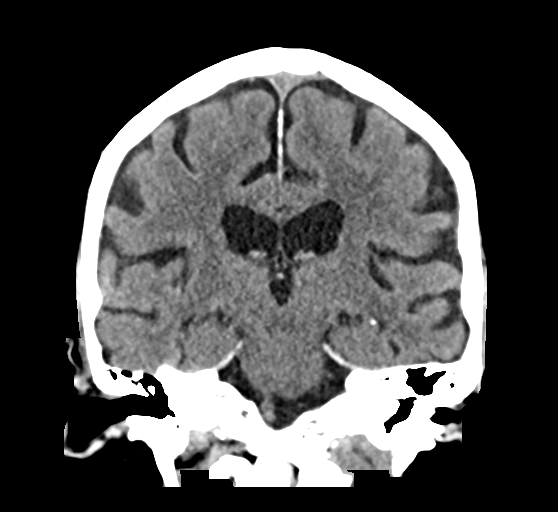

[Series 6: sag soft · sagittal · 0.37mm/px · 3 of 50 slices shown]
[im 17/50  brain]
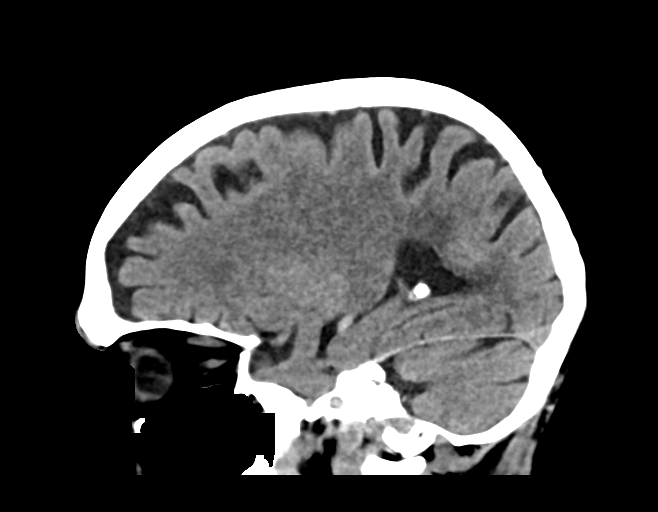
[im 25/50  brain]
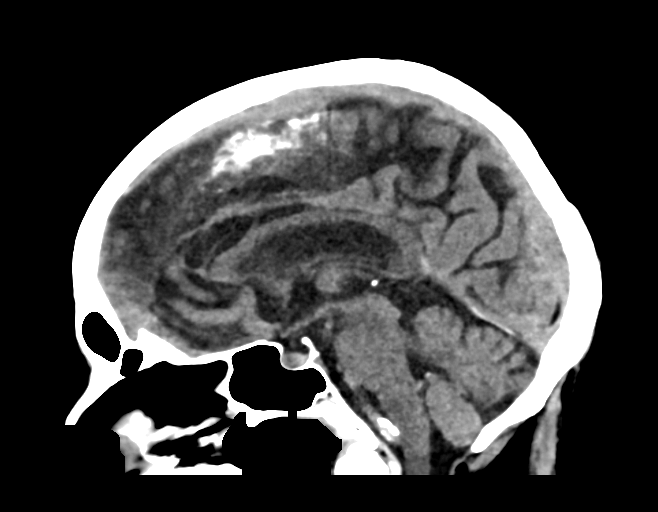
[im 33/50  brain]
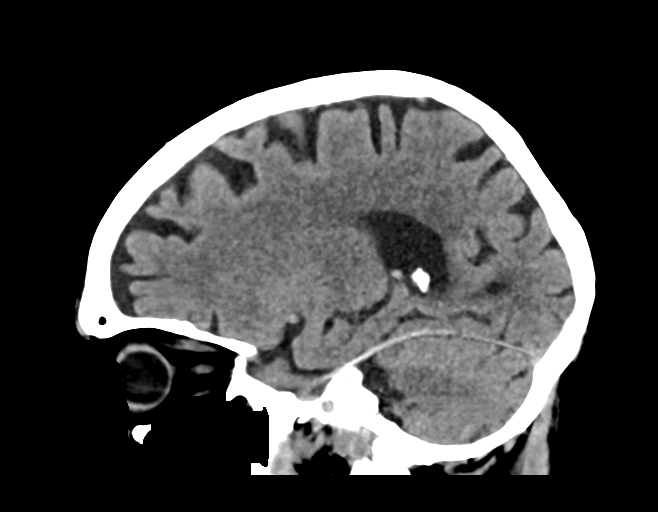

[16 of 47 positions shown; findings below may reference images not displayed]

FINDINGS: Brain: There is no evidence of acute infarct, intracranial
hemorrhage, mass, midline shift, or extra-axial fluid collection.
Generalized cerebral atrophy is not greater than expected for age.
Cerebral white matter hypodensities are similar to the prior study
and nonspecific but compatible with mild chronic small vessel
ischemic disease.

Vascular: Calcified atherosclerosis at the skull base. No hyperdense
vessel.

Skull: No fracture or focal osseous lesion.

Sinuses/Orbits: Visualized paranasal sinuses and mastoid air cells
are clear. Bilateral cataract extraction is noted.

Other: None.

ASPECTS (Alberta Stroke Program Early CT Score)

- Ganglionic level infarction (caudate, lentiform nuclei, internal
capsule, insula, M1-M3 cortex): 7

- Supraganglionic infarction (M4-M6 cortex): 3

Total score (0-10 with 10 being normal): 10
IMPRESSION: 1. No evidence of acute intracranial abnormality.
2. ASPECTS is 10.
3. Mild chronic small vessel ischemic disease.

These results were communicated to Dr. Licea at [DATE] on 08/20/2018
by text page via the [employer] messaging system.

## 2020-08-01 NOTE — Progress Notes (Signed)
Cardiology Office Note:    Date:  08/02/2020   ID:  Anthony Jensen., DOB 1925-09-14, MRN [id number]  PCP:  [name], MD  Cardiologist:  Shirlee More, MD    Referring MD: [name], MD    ASSESSMENT:    1. Coronary artery disease involving native coronary artery of native heart with angina pectoris ([place])   2. Essential hypertension   3. Hypertensive heart disease with heart failure ([place])   4. Mixed dyslipidemia   5. Pacemaker   6. Hypertensive heart and chronic kidney disease with heart failure and stage 1 through stage 4 chronic kidney disease, or chronic kidney disease ([place])    PLAN:    In order of problems listed above:  1. Stable at this point in his life he is quite sedentary but is having no angina continue medical therapy including aspirin resume lipid-lowering therapy pravastatin and his antihypertensive agents. 2. Stable BP at target, I am not can to put him on ACE or ARB or Entresto with CKD 3. Heart failure compensated currently on low-dose loop diuretic continue with same 4. Resume statin with CAD 5. Stable pacemaker function followed in our device clinic 6. Stable CKD   Next appointment: 6 months   Medication Adjustments/Labs and Tests Ordered: Current medicines are reviewed at length with the patient today.  Concerns regarding medicines are outlined above.  Orders Placed This Encounter  Procedures  . EKG 12-Lead   Meds ordered this encounter  Medications  . pravastatin (PRAVACHOL) 20 MG tablet    Sig: Take 1 tablet (20 mg total) by mouth every evening.    Dispense:  90 tablet    Refill:  3    Chief Complaint  Patient presents with  . Follow-up    History of Present Illness:    Anthony Jensen. is a 87 y.o. male with a hx of CAD multiple cardiac interventions CKD hypertension hyperlipidemia heart block dual-chamber Medtronic pacemaker and heart failure.  Last seen 10/07/2019.  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  His caregiver is here participates in the evaluation and decision making.  Most recent labs through his primary care physician 2020-05-29 shows a random glucose of 138 creatinine 1.80 TSH 1.08  An echocardiogram 11/09/2019 showing reduced ejection fraction 30 to 35% with global hypokinesia normal right ventricular size moderate left atrial enlargement and no evidence of aortic stenosis Past Medical History:  Diagnosis Date  . Acquired hypothyroidism 12/20/2016  . ANEMIA 02/21/2010   Qualifier: Diagnosis of  By: Nelson-Smith CMA (AAMA), Dottie    . Benign nodular prostatic hyperplasia 11/21/2014  . Carotid bruit 09/29/2011  . Chronic combined systolic and diastolic heart failure ([place]) 05/21/2015  . Chronic renal insufficiency 09/28/2011  . Coronary artery disease 09/28/2011   Overview:  a.  Exertional chest discomfort/arm pain with dyspnea.    b.  Bare Metal Stent to proximal left circumflex, (09/1998).    c.  Cypher Stent to mid LAD, (06/03/2005).  d.  MRI, (06/10/2010):  demonstrating normal LVEF with evidence of mild--moderate infarct of inferolateral subendocardial region with peri-infarct ischemia in the left circumflex distribution.    e.  Status post cardiac catheterization (06/23/2010) with drug-eluting stent placed at 60-80% lesion in mid-RCA.  f.  Status post cardiac cath (11/14/2010) to evaluate recurrent chest pain/arm pain and external Adenosine stress nuclear positive for septal ischemia.  Catheterization demonstrated patent stents with no change in coronary anatomy from previous study of 06/23/2010.    . Essential hypertension 12/20/2016  . Gout 09/28/2011  . History of alcohol abuse 09/28/2011  . History of nephrolithiasis 09/28/2011  . History of  rheumatic fever 09/28/2011  . History of tobacco abuse 09/28/2011  . Hyperlipidemia 01/14/2017  . Hypertension 09/28/2011  . Mixed dyslipidemia 12/20/2016  . Multinodular thyroid 09/28/2011  . Near syncope 12/20/2016  . Pacemaker reprogramming/check 05/21/2015   Overview:  Dual chamber 12/24/14  . Peptic ulcer disease 09/28/2011  . Presence of stent in coronary artery in patient with coronary artery disease 12/20/2016  . Prostate nodule 11/21/2014  . Sinus node dysfunction ([place]) 12/24/2014    Past Surgical History:  Procedure Laterality Date  . CORONARY ANGIOPLASTY    . PACEMAKER IMPLANT    . PENILE PROSTHESIS IMPLANT    . THYROID SURGERY      Current Medications: Current Meds  Medication Sig  . amLODipine (NORVASC) 5 MG tablet TAKE 1 TABLET EVERY MORNING, TAKE A SECOND TABLET IN THE EVENING ON MONDAY, WEDNESDAY AND FRIDAY (Patient taking differently: Take by mouth daily.)  . Aspirin Buf,CaCarb-MgCarb-MgO, (BUFFERIN LOW DOSE) 81 MG TABS Take 2 tablets by mouth.  . clopidogrel (PLAVIX) 75 MG tablet Take 75 mg by mouth daily.  . furosemide (LASIX) 40 MG tablet Take 1 tablet (40 mg total) by mouth daily.  Marland Kitchen levothyroxine (SYNTHROID, LEVOTHROID) 125 MCG tablet Take 125 mcg by mouth daily.   . nebivolol (BYSTOLIC) 5 MG tablet Take 0.5 tablets (2.5 mg total) by mouth daily.  . potassium chloride SA (KLOR-CON) 20 MEQ tablet Take 0.5 tablets (10 mEq total) by mouth daily.  . pravastatin (PRAVACHOL) 20 MG tablet Take 1 tablet (20 mg total) by mouth every evening.  . tamsulosin (FLOMAX) 0.4 MG CAPS capsule Take 0.4 mg by mouth.  . Vitamin D, Ergocalciferol, (DRISDOL) 1.25 MG (50000 UNIT) CAPS capsule Take 50,000 Units by mouth 2 (two) times a week.     Allergies:   Lorazepam and Crestor [rosuvastatin calcium]   Social History   Socioeconomic History  . Marital status: Widowed    Spouse name: Not on file  . Number of children: Not on file  . Years of education: Not on file  . Highest  education level: Not on file  Occupational History  . Not on file  Tobacco Use  . Smoking status: Former Smoker    Types: Cigarettes  . Smokeless tobacco: Never Used  Vaping Use  . Vaping Use: Never used  Substance and Sexual Activity  . Alcohol use: No  . Drug use: No  . Sexual activity: Not Currently  Other Topics Concern  . Not on file  Social History Narrative  . Not on file   Social Determinants of Health   Financial Resource Strain: Not on file  Food Insecurity: Not on file  Transportation Needs: Not on file  Physical Activity: Not on file  Stress: Not on file  Social Connections: Not on file     Family History: The patient's family history includes CAD in his father. ROS:   Please see the history of present illness.    All other systems reviewed and are negative.  EKGs/Labs/Other Studies Reviewed:    The following studies were reviewed today:  EKG:  EKG ordered today and personally reviewed.  The ekg ordered today demonstrates dual-chamber paced rhythm  Recent Labs: 08/14/2019: ALT 6 11/03/2019: BUN 26; Creatinine, Ser 1.93; Hemoglobin 11.5; Magnesium 2.1; NT-Pro BNP 4,450; Platelets 191; Potassium 4.0; Sodium 136; TSH 57.200  Recent Lipid Panel    Component Value Date/Time   CHOL 191 08/21/2018 0419   CHOL 215 (H) 06/20/2018 1645   TRIG 85 08/21/2018 0419   HDL 58 08/21/2018 0419   HDL 70 06/20/2018 1645   CHOLHDL 3.3 08/21/2018 0419   VLDL 17 08/21/2018 0419   LDLCALC 116 (H) 08/21/2018 0419   LDLCALC 117 (H) 06/20/2018 1645    Physical Exam:    VS:  BP 130/68   Pulse 74   Ht 5\' 6"  (1.676 m)   Wt 147 lb 9.6 oz (67 kg)   SpO2 99%   BMI 23.82 kg/m     Wt Readings from Last 3 Encounters:  08/02/20 147 lb 9.6 oz (67 kg)  11/03/19 150 lb (68 kg)  08/14/19 155 lb (70.3 kg)     GEN: He looks frail well nourished, well developed in no acute distress HEENT: Normal NECK: No JVD; No carotid bruits LYMPHATICS: No lymphadenopathy CARDIAC: RRR, no  murmurs, rubs, gallops RESPIRATORY:  Clear to auscultation without rales, wheezing or rhonchi  ABDOMEN: Soft, non-tender, non-distended MUSCULOSKELETAL:  No edema; No deformity  SKIN: Warm and dry NEUROLOGIC:  Alert and oriented x 3 PSYCHIATRIC:  Normal affect    Signed, Shirlee More, MD  08/02/2020 12:00 PM    [place]

## 2020-08-02 ENCOUNTER — Encounter: Payer: Self-pay | Admitting: Cardiology

## 2020-08-02 ENCOUNTER — Other Ambulatory Visit: Payer: Self-pay

## 2020-08-02 ENCOUNTER — Ambulatory Visit (INDEPENDENT_AMBULATORY_CARE_PROVIDER_SITE_OTHER): Payer: Medicare Other | Admitting: Cardiology

## 2020-08-02 VITALS — BP 130/68 | HR 74 | Ht 66.0 in | Wt 147.6 lb

## 2020-08-02 DIAGNOSIS — I10 Essential (primary) hypertension: Secondary | ICD-10-CM | POA: Diagnosis not present

## 2020-08-02 DIAGNOSIS — Z950 Presence of cardiac pacemaker: Secondary | ICD-10-CM | POA: Diagnosis not present

## 2020-08-02 DIAGNOSIS — R0602 Shortness of breath: Secondary | ICD-10-CM | POA: Diagnosis not present

## 2020-08-02 DIAGNOSIS — I130 Hypertensive heart and chronic kidney disease with heart failure and stage 1 through stage 4 chronic kidney disease, or unspecified chronic kidney disease: Secondary | ICD-10-CM | POA: Diagnosis not present

## 2020-08-02 DIAGNOSIS — E782 Mixed hyperlipidemia: Secondary | ICD-10-CM

## 2020-08-02 DIAGNOSIS — I25119 Atherosclerotic heart disease of native coronary artery with unspecified angina pectoris: Secondary | ICD-10-CM

## 2020-08-02 DIAGNOSIS — I700 Atherosclerosis of aorta: Secondary | ICD-10-CM | POA: Diagnosis not present

## 2020-08-02 DIAGNOSIS — I110 Hypertensive heart disease with heart failure: Secondary | ICD-10-CM | POA: Diagnosis not present

## 2020-08-02 MED ORDER — PRAVASTATIN SODIUM 20 MG PO TABS: 20.0000 mg | ORAL_TABLET | Freq: Every evening | ORAL | 3 refills | Status: DC

## 2020-08-02 NOTE — Patient Instructions (Signed)
Medication Instructions:  Your physician has recommended you make the following change in your medication:  START: Pravastatin 20 mg take one tablet by mouth daily.  *If you need a refill on your cardiac medications before your next appointment, please call your pharmacy*   Lab Work: None If you have labs (blood work) drawn today and your tests are completely normal, you will receive your results only by: Marland Kitchen MyChart Message (if you have MyChart) OR . A paper copy in the mail If you have any lab test that is abnormal or we need to change your treatment, we will call you to review the results.   Testing/Procedures: We have given you the order to have a chest x-ray performed at [facility]. Please get this done at your earliest convenience.    Follow-Up: At [facility] you and your health needs are our priority.  As part of our continuing mission to provide you with exceptional heart care, we have created designated Provider Care Teams.  These Care Teams include your primary Cardiologist (physician) and Advanced Practice Providers (APPs -  Physician Assistants and Nurse Practitioners) who all work together to provide you with the care you need, when you need it.  We recommend signing up for the patient portal called "MyChart".  Sign up information is provided on this After Visit Summary.  MyChart is used to connect with patients for Virtual Visits (Telemedicine).  Patients are able to view lab/test results, encounter notes, upcoming appointments, etc.  Non-urgent messages can be sent to your provider as well.   To learn more about what you can do with MyChart, go to [email or web address].    Your next appointment:   3 month(s)  The format for your next appointment:   In Person  Provider:   Shirlee More, MD   Other Instructions

## 2020-08-05 ENCOUNTER — Ambulatory Visit: Payer: Medicare Other | Admitting: Cardiology

## 2020-09-17 DIAGNOSIS — R1310 Dysphagia, unspecified: Secondary | ICD-10-CM | POA: Diagnosis not present

## 2020-09-20 DIAGNOSIS — K222 Esophageal obstruction: Secondary | ICD-10-CM | POA: Diagnosis not present

## 2020-09-20 DIAGNOSIS — K209 Esophagitis, unspecified without bleeding: Secondary | ICD-10-CM | POA: Diagnosis not present

## 2020-11-19 ENCOUNTER — Telehealth: Payer: Self-pay

## 2020-11-19 NOTE — Telephone Encounter (Signed)
   [facility] Pre-operative Risk Assessment    Patient Name: Anthony Jensen [facility].  DOB: 08-Oct-1925  MRN: [id number]   HEARTCARE STAFF: - Please ensure there is not already an duplicate clearance open for this procedure. - Under Visit Info/Reason for Call, type in Other and utilize the format Clearance MM/DD/YY or Clearance TBD. Do not use dashes or single digits. - If request is for dental extraction, please clarify the # of teeth to be extracted.  Request for surgical clearance:  1. What type of surgery is being performed? Extraction of teeth with bone grafting.    2. When is this surgery scheduled? TBD   3. What type of clearance is required (medical clearance vs. Pharmacy clearance to hold med vs. Both)? Both  4. Are there any medications that need to be held prior to surgery and how long? Plavix. Unspecified length of time.    5. Practice name and name of physician performing surgery? [facility].    6. What is the office phone number? [phone]   7.   What is the office fax number? [phone]  8.   Anesthesia type (None, local, MAC, general) ? Unspecified   Gita Kudo 11/19/2020, 9:04 AM  _________________________________________________________________   (provider comments below)

## 2020-11-19 NOTE — Telephone Encounter (Signed)
    Generally dental procedures do not require specific clearance and anticoag/antiplt meds do not need to be held.  But request is for extraction of teeth with bone grafting.  As procedure is likely more complex than a simple dental extraction, attempt made to contact the pt. There was no answer and no voice mail.  Will ask Dr. Bettina Gavia to weigh in on question of holding Plavix while we are waiting to contact the pt.  Dr. Bettina Gavia - Can you comment on holding Plavix for 5 days for upcoming dental procedure.  Also if, based upon our interview, the patient's functional capacity is < 4 METs, would you prefer a Myoview be obtained prior to providing clearance?  Richardson Dopp, PA-C    11/19/2020 10:18 AM

## 2020-11-19 NOTE — Telephone Encounter (Signed)
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

## 2020-11-21 NOTE — Telephone Encounter (Signed)
   Primary Cardiologist: Shirlee More, MD  Chart reviewed as part of pre-operative protocol coverage. Given past medical history and time since last visit, based on ACC/AHA guidelines, Anthony Jensen. would be at acceptable risk for the planned procedure without further cardiovascular testing.   His Plavix may be held for 5 days prior to the procedure.  Please resume as soon as hemostasis is achieved at the discretion of the surgeon.  I will route this recommendation to the requesting party via Epic fax function and remove from pre-op pool.  Please call with questions.  Jossie Ng. Braelin Costlow NP-C    11/21/2020, 8:20 AM [place] [address] Office [phone] Fax [phone]

## 2020-12-03 NOTE — Telephone Encounter (Signed)
dentist office is asking that the clearance be refax because they haven't received it  yet. Please advise

## 2020-12-03 NOTE — Telephone Encounter (Signed)
S/w dental office today and confirmed they did receive the clearance notes today at 11:40.

## 2020-12-03 NOTE — Telephone Encounter (Signed)
Fax resubmitted. Please confirm it has been received by the requesting office. Thank you!

## 2020-12-13 DIAGNOSIS — H9190 Unspecified hearing loss, unspecified ear: Secondary | ICD-10-CM | POA: Diagnosis not present

## 2020-12-13 DIAGNOSIS — H6123 Impacted cerumen, bilateral: Secondary | ICD-10-CM | POA: Diagnosis not present

## 2021-02-04 DIAGNOSIS — C44300 Unspecified malignant neoplasm of skin of unspecified part of face: Secondary | ICD-10-CM | POA: Diagnosis not present

## 2021-02-04 DIAGNOSIS — D510 Vitamin B12 deficiency anemia due to intrinsic factor deficiency: Secondary | ICD-10-CM | POA: Diagnosis not present

## 2021-02-04 DIAGNOSIS — I10 Essential (primary) hypertension: Secondary | ICD-10-CM | POA: Diagnosis not present

## 2021-02-04 DIAGNOSIS — Z6822 Body mass index (BMI) 22.0-22.9, adult: Secondary | ICD-10-CM | POA: Diagnosis not present

## 2021-02-04 DIAGNOSIS — M1990 Unspecified osteoarthritis, unspecified site: Secondary | ICD-10-CM | POA: Diagnosis not present

## 2021-02-07 ENCOUNTER — Encounter: Payer: Self-pay | Admitting: Cardiology

## 2021-02-07 ENCOUNTER — Telehealth: Payer: Self-pay

## 2021-02-07 ENCOUNTER — Other Ambulatory Visit: Payer: Self-pay

## 2021-02-07 ENCOUNTER — Ambulatory Visit (INDEPENDENT_AMBULATORY_CARE_PROVIDER_SITE_OTHER): Payer: Medicare Other | Admitting: Cardiology

## 2021-02-07 VITALS — BP 100/60 | HR 74 | Ht 66.0 in | Wt 140.0 lb

## 2021-02-07 DIAGNOSIS — I25119 Atherosclerotic heart disease of native coronary artery with unspecified angina pectoris: Secondary | ICD-10-CM | POA: Diagnosis not present

## 2021-02-07 DIAGNOSIS — J309 Allergic rhinitis, unspecified: Secondary | ICD-10-CM | POA: Diagnosis not present

## 2021-02-07 DIAGNOSIS — I110 Hypertensive heart disease with heart failure: Secondary | ICD-10-CM | POA: Diagnosis not present

## 2021-02-07 DIAGNOSIS — Z950 Presence of cardiac pacemaker: Secondary | ICD-10-CM

## 2021-02-07 DIAGNOSIS — E782 Mixed hyperlipidemia: Secondary | ICD-10-CM

## 2021-02-07 NOTE — Telephone Encounter (Signed)
Contacted patient, spoke with caregiver Sherrie (DPR on file).  She confirmed that patient still has monitor and it plugged in.  She will have patient send a manual transmission when they get home.  She will call if there is any issue with sending transmission.

## 2021-02-07 NOTE — Progress Notes (Signed)
Progress Cardiology Office Note:    Date:  02/07/2021   ID:  Anthony Jensen., DOB December 23, 1925, MRN [id number]  PCP:  [name], MD  Cardiologist:  Shirlee More, MD    Referring MD: [name], MD    ASSESSMENT:    1. Coronary artery disease involving native coronary artery of native heart with angina pectoris ([place])   2. Hypertensive heart disease with heart failure ([place])   3. Pacemaker   4. 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  I would not advise an ischemia evaluation BP at target continue current treatment is not a loop diuretic and has no volume overload Overdue we will set him up for renal download Poorly controlled he declines lipid-lowering therapy and I cannot argue with him and his age   Next appointment: 6 months   Medication Adjustments/Labs and Tests Ordered: Current medicines are reviewed at length with the patient today.  Concerns regarding medicines are outlined above.  No orders of the defined types were placed in this encounter.  No orders of the defined types were placed in this encounter.   Chief Complaint  Patient presents with   Follow-up    History of Present Illness:    Anthony Jensen. is a 87 y.o. 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  His most recent EKG 11/09/2019 showed an EF of 30 to 35% with global hypokinesia moderate left atrial enlargement and no finding of aortic stenosis.  Compliance with diet, lifestyle and  medications: Yes  Is good to see Dr. Rhona Raider in the office he continues to do well in life no edema shortness of breath chest pain palpitation or syncope. Is bothered by gout and has an appointment to receive a Kenalog injection Apparently kidney function is worsened when he is going to be seeing CKD a Recent labs available to me 05/29/2020 creatinine 1.80 cholesterol 268 LDL 116 triglycerides 119 HDL 39. Is having labs drawn today after leaving my office.  His last remote pacemaker download was March 2021 projected battery 55 months normal lead and device parameters he was ventricularly paced 75% of the time and atrially paced 100% of the time.   Past Medical History:  Diagnosis Date   Acquired hypothyroidism 12/20/2016   ANEMIA 02/21/2010   Qualifier: Diagnosis of  By: Nelson-Smith CMA (AAMA), Dottie     Benign nodular prostatic hyperplasia 11/21/2014   Carotid bruit 09/29/2011   Chronic combined systolic and diastolic heart failure ([place]) 05/21/2015   Chronic renal insufficiency 09/28/2011   Coronary artery disease 09/28/2011   Overview:  a.  Exertional chest discomfort/arm pain with dyspnea.    b.  Bare Metal Stent to proximal left circumflex, (09/1998).    c.  Cypher Stent to mid LAD, (06/03/2005).  d.  MRI, (06/10/2010):  demonstrating normal LVEF with evidence of mild--moderate infarct of inferolateral subendocardial region with peri-infarct ischemia in the left circumflex distribution.    e.  Status post cardiac catheterization (06/23/2010) with drug-eluting stent placed at 60-80% lesion  in mid-RCA.    f.  Status post cardiac cath (11/14/2010) to evaluate recurrent chest pain/arm pain and external Adenosine stress nuclear positive for septal ischemia.  Catheterization demonstrated patent stents with no change in coronary anatomy from previous study of 06/23/2010.     Essential hypertension 12/20/2016   Gout 09/28/2011   History of alcohol abuse 09/28/2011   History of nephrolithiasis 09/28/2011    History of rheumatic fever 09/28/2011   History of tobacco abuse 09/28/2011   Hyperlipidemia 01/14/2017   Hypertension 09/28/2011   Mixed dyslipidemia 12/20/2016   Multinodular thyroid 09/28/2011   Near syncope 12/20/2016   Pacemaker reprogramming/check 05/21/2015   Overview:  Dual chamber 12/24/14   Peptic ulcer disease 09/28/2011   Presence of stent in coronary artery in patient with coronary artery disease 12/20/2016   Prostate nodule 11/21/2014   Sinus node dysfunction ([place]) 12/24/2014    Past Surgical History:  Procedure Laterality Date   CORONARY ANGIOPLASTY     PACEMAKER IMPLANT     PENILE PROSTHESIS IMPLANT     THYROID SURGERY      Current Medications: Current Meds  Medication Sig   amLODipine (NORVASC) 5 MG tablet Take 5 mg by mouth daily.   Aspirin Buf,CaCarb-MgCarb-MgO, (BUFFERIN LOW DOSE) 81 MG TABS Take 2 tablets by mouth daily.   clopidogrel (PLAVIX) 75 MG tablet Take 75 mg by mouth daily.   furosemide (LASIX) 40 MG tablet Take 1 tablet (40 mg total) by mouth daily.   levothyroxine (SYNTHROID, LEVOTHROID) 125 MCG tablet Take 125 mcg by mouth daily.    nebivolol (BYSTOLIC) 5 MG tablet Take 0.5 tablets (2.5 mg total) by mouth daily.   potassium chloride SA (KLOR-CON) 20 MEQ tablet Take 0.5 tablets (10 mEq total) by mouth daily.   tamsulosin (FLOMAX) 0.4 MG CAPS capsule Take 0.4 mg by mouth at bedtime.   Vitamin D, Ergocalciferol, (DRISDOL) 1.25 MG (50000 UNIT) CAPS capsule Take 50,000 Units by mouth 2 (two) times a week.     Allergies:   Lorazepam and Crestor [rosuvastatin calcium]   Social History   Socioeconomic History   Marital status: Widowed    Spouse name: Not on file   Number of children: Not on file   Years of education: Not on file   Highest education level: Not on file  Occupational History   Not on file  Tobacco Use   Smoking status: Former    Types: Cigarettes   Smokeless tobacco: Never  Vaping Use   Vaping Use: Never used  Substance and Sexual  Activity   Alcohol use: No   Drug use: No   Sexual activity: Not Currently  Other Topics Concern   Not on file  Social History Narrative   Not on file   Social Determinants of Health   Financial Resource Strain: Not on file  Food Insecurity: Not on file  Transportation Needs: Not on file  Physical Activity: Not on file  Stress: Not on file  Social Connections: Not on file     Family History: The patient's family history includes CAD in his father. ROS:   Please see the history of present illness.    All other systems reviewed and are negative.  EKGs/Labs/Other Studies Reviewed:    The following studies were reviewed today:    Recent Labs: No results found for requested labs within last 8760 hours.  Recent Lipid Panel    Component Value Date/Time   CHOL 191 08/21/2018 0419   CHOL 215 (H) 06/20/2018 1645  TRIG 85 08/21/2018 0419   HDL 58 08/21/2018 0419   HDL 70 06/20/2018 1645   CHOLHDL 3.3 08/21/2018 0419   VLDL 17 08/21/2018 0419   LDLCALC 116 (H) 08/21/2018 0419   LDLCALC 117 (H) 06/20/2018 1645    Physical Exam:    VS:  BP 100/60 (BP Location: Left Arm, Patient Position: Sitting, Cuff Size: Normal)   Pulse 74   Ht '5\' 6"'$  (1.676 m)   Wt 140 lb (63.5 kg)   SpO2 95%   BMI 22.60 kg/m     Wt Readings from Last 3 Encounters:  02/07/21 140 lb (63.5 kg)  08/02/20 147 lb 9.6 oz (67 kg)  11/03/19 150 lb (68 kg)     GEN: He looks his age quite alert cognitively intact well nourished, well developed in no acute distress HEENT: Normal NECK: No JVD; No carotid bruits LYMPHATICS: No lymphadenopathy CARDIAC: Pacemaker pocket is healed no effusion or skin redness RRR, no murmurs, rubs, gallops RESPIRATORY:  Clear to auscultation without rales, wheezing or rhonchi  ABDOMEN: Soft, non-tender, non-distended MUSCULOSKELETAL:  No edema; No deformity  SKIN: Warm and dry NEUROLOGIC:  Alert and oriented x 3 PSYCHIATRIC:  Normal affect    Signed, Shirlee More, MD   02/07/2021 3:02 PM    [facility]

## 2021-02-07 NOTE — Patient Instructions (Signed)

## 2021-02-07 NOTE — Telephone Encounter (Signed)
-----   Message from Gita Kudo, RN sent at 02/07/2021  3:00 PM EDT ----- Please schedule patient for a pacemaking download. Dr. Bettina Gavia states it has been over a year since he last had one.   Lilia Pro, RN

## 2021-02-10 NOTE — Telephone Encounter (Signed)
I spoke with the patient and caregiver per patient request. She plug the monitor up and going to try to send the transmission in an hour. I gave her the number to [place] support in case she has issues with the monitor.

## 2021-02-13 NOTE — Telephone Encounter (Signed)
No answer no voice mail  

## 2021-02-17 NOTE — Telephone Encounter (Signed)
No answer/no voicemail. Sending Certified letter [id number]

## 2021-02-19 DIAGNOSIS — L579 Skin changes due to chronic exposure to nonionizing radiation, unspecified: Secondary | ICD-10-CM | POA: Diagnosis not present

## 2021-02-19 DIAGNOSIS — D485 Neoplasm of uncertain behavior of skin: Secondary | ICD-10-CM | POA: Diagnosis not present

## 2021-02-19 DIAGNOSIS — C4401 Basal cell carcinoma of skin of lip: Secondary | ICD-10-CM | POA: Diagnosis not present

## 2021-02-19 DIAGNOSIS — C44311 Basal cell carcinoma of skin of nose: Secondary | ICD-10-CM | POA: Diagnosis not present

## 2021-04-06 IMAGING — DX DG CHEST 1V PORT
1 series · 1 of 1 positions shown · non-contrast
Comparison: 08/21/2018, CT 01/14/2016

CLINICAL DATA: Cough and shortness of breath.  Fever.

EXAM:
PORTABLE CHEST 1 VIEW

[chest ap]
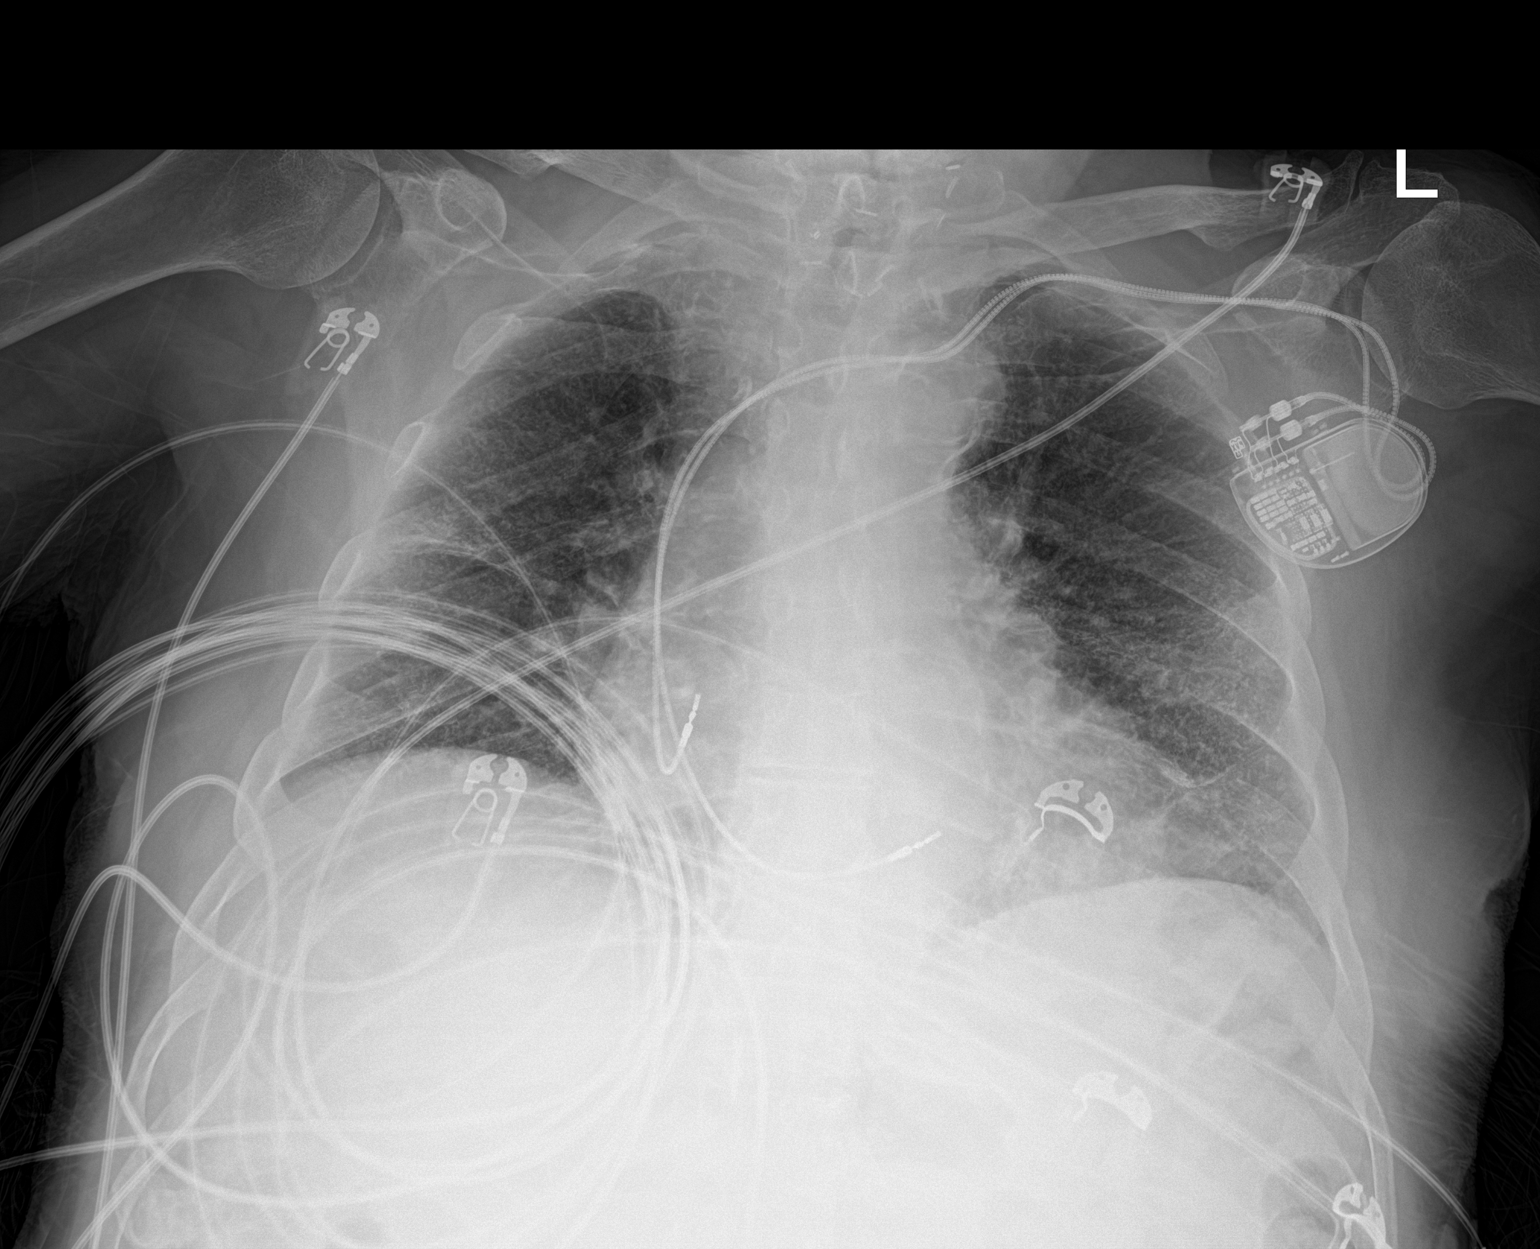

[1 of 1 positions shown; findings below may reference images not displayed]

FINDINGS: Fine reticular changes less prominent in the periphery suggesting
interstitial lung disease. Left-sided pacemaker in place. Upper
normal heart size with unchanged mediastinal contours. Aortic
atherosclerosis. No confluent airspace disease, pleural fluid or
pneumothorax. No evidence of pulmonary edema.
IMPRESSION: 1. No acute findings.  Stable mild cardiomegaly.
2. Peripheral fine reticular opacities in both lungs are chronic,
suggest interstitial lung disease.

## 2021-05-28 DIAGNOSIS — Z85828 Personal history of other malignant neoplasm of skin: Secondary | ICD-10-CM | POA: Diagnosis not present

## 2021-05-28 DIAGNOSIS — L579 Skin changes due to chronic exposure to nonionizing radiation, unspecified: Secondary | ICD-10-CM | POA: Diagnosis not present

## 2021-05-28 DIAGNOSIS — L570 Actinic keratosis: Secondary | ICD-10-CM | POA: Diagnosis not present

## 2021-07-10 ENCOUNTER — Ambulatory Visit: Payer: Medicare Other | Admitting: Cardiology

## 2021-07-13 NOTE — Progress Notes (Deleted)
Cardiology Office Note:    Date:  07/13/2021   ID:  Anthony Pew [name], DOB 1925/08/29, MRN [id number]  PCP:  [name], MD  Cardiologist:  Shirlee More, MD    Referring MD: [name], MD    ASSESSMENT:    No diagnosis found. PLAN:    In order of problems listed above:  ***   Next appointment: ***   Medication Adjustments/Labs and Tests Ordered: Current medicines are reviewed at length with the patient today.  Concerns regarding medicines are outlined above.  No orders of the defined types were placed in this encounter.  No orders of the defined types were placed in this encounter.   No chief complaint on file.   History of Present Illness:    Anthony Limas. is a 88 y.o. male with a hx of CAD multiple cardiac interventions CKD hypertensive heart disease hyperlipidemia heart block dual-chamber Medtronic pacemaker and congestive heart failure last seen 08/02/2020.  His most recent echocardiogram 11/09/2019 showed an EF of 30 to 35% with global hypokinesia moderate left atrial enlargement and no finding of aortic stenosis  last seen 02/07/2021.  Compliance with diet, lifestyle and medications: *** Past Medical History:  Diagnosis Date   Acquired hypothyroidism 12/20/2016   ANEMIA 02/21/2010   Qualifier: Diagnosis of  By: Nelson-Smith CMA (AAMA), Dottie     Benign nodular prostatic hyperplasia 11/21/2014   Carotid bruit 09/29/2011   Chronic combined systolic and diastolic heart failure ([place]) 05/21/2015   Chronic renal insufficiency 09/28/2011   Coronary artery disease 09/28/2011   Overview:  a.  Exertional chest discomfort/arm pain with dyspnea.    b.  Bare Metal Stent to proximal left circumflex, (09/1998).    c.  Cypher Stent to mid LAD, (06/03/2005).  d.  MRI, (06/10/2010):  demonstrating normal LVEF with evidence of mild--moderate infarct of inferolateral subendocardial region with peri-infarct ischemia in the left circumflex  distribution.    e.  Status post cardiac catheterization (06/23/2010) with drug-eluting stent placed at 60-80% lesion in mid-RCA.    f.  Status post cardiac cath (11/14/2010) to evaluate recurrent chest pain/arm pain and external Adenosine stress nuclear positive for septal ischemia.  Catheterization demonstrated patent stents with no change in coronary anatomy from previous study of 06/23/2010.     Essential hypertension 12/20/2016   Gout 09/28/2011   History of alcohol abuse 09/28/2011   History of nephrolithiasis 09/28/2011   History of rheumatic fever 09/28/2011   History of tobacco abuse 09/28/2011   Hyperlipidemia 01/14/2017   Hypertension 09/28/2011   Mixed dyslipidemia 12/20/2016   Multinodular thyroid 09/28/2011   Near syncope 12/20/2016   Pacemaker reprogramming/check 05/21/2015   Overview:  Dual chamber 12/24/14   Peptic ulcer disease 09/28/2011   Presence of stent in coronary artery in patient with coronary artery disease 12/20/2016   Prostate nodule 11/21/2014   Sinus node dysfunction ([place]) 12/24/2014    Past Surgical History:  Procedure Laterality Date   CORONARY ANGIOPLASTY     PACEMAKER IMPLANT     PENILE PROSTHESIS IMPLANT     THYROID SURGERY      Current Medications: No outpatient medications have been marked as taking for the 07/14/21 encounter (Appointment) with Richardo Priest, MD.     Allergies:   Lorazepam and Crestor [rosuvastatin calcium]   Social History   Socioeconomic History   Marital status: Widowed    Spouse name: Not on file   Number of children: Not on file  Years of education: Not on file   Highest education level: Not on file  Occupational History   Not on file  Tobacco Use   Smoking status: Former    Types: Cigarettes   Smokeless tobacco: Never  Vaping Use   Vaping Use: Never used  Substance and Sexual Activity   Alcohol use: No   Drug use: No   Sexual activity: Not Currently  Other Topics Concern   Not on file  Social History Narrative    Not on file   Social Determinants of Health   Financial Resource Strain: Not on file  Food Insecurity: Not on file  Transportation Needs: Not on file  Physical Activity: Not on file  Stress: Not on file  Social Connections: Not on file     Family History: The patient's ***family history includes CAD in his father. ROS:   Please see the history of present illness.    All other systems reviewed and are negative.  EKGs/Labs/Other Studies Reviewed:    The following studies were reviewed today:  EKG:  EKG ordered today and personally reviewed.  The ekg ordered today demonstrates ***  Recent Labs: No results found for requested labs within last 8760 hours.  Recent Lipid Panel    Component Value Date/Time   CHOL 191 08/21/2018 0419   CHOL 215 (H) 06/20/2018 1645   TRIG 85 08/21/2018 0419   HDL 58 08/21/2018 0419   HDL 70 06/20/2018 1645   CHOLHDL 3.3 08/21/2018 0419   VLDL 17 08/21/2018 0419   LDLCALC 116 (H) 08/21/2018 0419   LDLCALC 117 (H) 06/20/2018 1645    Physical Exam:    VS:  There were no vitals taken for this visit.    Wt Readings from Last 3 Encounters:  02/07/21 140 lb (63.5 kg)  08/02/20 147 lb 9.6 oz (67 kg)  11/03/19 150 lb (68 kg)     GEN: *** Well nourished, well developed in no acute distress HEENT: Normal NECK: No JVD; No carotid bruits LYMPHATICS: No lymphadenopathy CARDIAC: ***RRR, no murmurs, rubs, gallops RESPIRATORY:  Clear to auscultation without rales, wheezing or rhonchi  ABDOMEN: Soft, non-tender, non-distended MUSCULOSKELETAL:  No edema; No deformity  SKIN: Warm and dry NEUROLOGIC:  Alert and oriented x 3 PSYCHIATRIC:  Normal affect    Signed, Shirlee More, MD  07/13/2021 3:17 PM    [facility]

## 2021-07-14 ENCOUNTER — Ambulatory Visit: Payer: Medicare Other | Admitting: Cardiology

## 2021-07-20 DIAGNOSIS — Z955 Presence of coronary angioplasty implant and graft: Secondary | ICD-10-CM | POA: Diagnosis not present

## 2021-07-20 DIAGNOSIS — Z7902 Long term (current) use of antithrombotics/antiplatelets: Secondary | ICD-10-CM | POA: Diagnosis not present

## 2021-07-20 DIAGNOSIS — Y929 Unspecified place or not applicable: Secondary | ICD-10-CM | POA: Diagnosis not present

## 2021-07-20 DIAGNOSIS — I2510 Atherosclerotic heart disease of native coronary artery without angina pectoris: Secondary | ICD-10-CM | POA: Diagnosis not present

## 2021-07-20 DIAGNOSIS — Z23 Encounter for immunization: Secondary | ICD-10-CM | POA: Diagnosis not present

## 2021-07-20 DIAGNOSIS — W010XXA Fall on same level from slipping, tripping and stumbling without subsequent striking against object, initial encounter: Secondary | ICD-10-CM | POA: Diagnosis not present

## 2021-07-20 DIAGNOSIS — S0990XA Unspecified injury of head, initial encounter: Secondary | ICD-10-CM | POA: Diagnosis not present

## 2021-07-20 DIAGNOSIS — S0101XA Laceration without foreign body of scalp, initial encounter: Secondary | ICD-10-CM | POA: Diagnosis not present

## 2021-07-25 ENCOUNTER — Ambulatory Visit: Payer: Medicare Other | Admitting: Sports Medicine

## 2021-07-30 DIAGNOSIS — S0100XA Unspecified open wound of scalp, initial encounter: Secondary | ICD-10-CM | POA: Diagnosis not present

## 2021-07-31 DIAGNOSIS — S0101XA Laceration without foreign body of scalp, initial encounter: Secondary | ICD-10-CM | POA: Diagnosis not present

## 2021-08-06 DIAGNOSIS — S0001XD Abrasion of scalp, subsequent encounter: Secondary | ICD-10-CM | POA: Diagnosis not present

## 2021-08-06 DIAGNOSIS — S0100XA Unspecified open wound of scalp, initial encounter: Secondary | ICD-10-CM | POA: Diagnosis not present

## 2021-08-10 NOTE — Progress Notes (Addendum)
Cardiology Office Note:    Date:  08/11/2021   ID:  Anthony Jensen., DOB 1925-09-15, MRN [id number]  PCP:  [name], MD  Cardiologist:  Shirlee More, MD    Referring MD: [name], MD    ASSESSMENT:    1. Coronary artery disease involving native coronary artery of native heart with angina pectoris ([place])   2. Hypertensive heart and chronic kidney disease with heart failure and stage 1 through stage 4 chronic kidney disease, or chronic kidney disease ([place])   3. Pacemaker   4. Mixed dyslipidemia    PLAN:    In order of problems listed above:  Dr. Rhona Raider continues to do well with CAD having no angina after percutaneous intervention and current treatment including low-dose aspirin clopidogrel and with his age and frailty he is not on lipid-lowering therapy. Stable BP at target continues amlodipine, loop diuretic no fluid overload also takes a low-dose beta-blocker We will do a download in office today get him back to device clinic His device download shows a 2-year battery life he has less than 1% atrial fibrillation he has had 3 brief episodes of ventricular tachyarrhythmia is ventricular paced 80% of the time No longer on lipid-lowering therapy at age 68 and I would raise the issue at this time   Next appointment: 6 months   Medication Adjustments/Labs and Tests Ordered: Current medicines are reviewed at length with the patient today.  Concerns regarding medicines are outlined above.  Orders Placed This Encounter  Procedures   CBC   Basic metabolic panel   Pro b natriuretic peptide (BNP)   No orders of the defined types were placed in this encounter.   Chief Complaint  Patient presents with   Follow-up   Coronary Artery Disease   Congestive Heart Failure    History of Present Illness:    Anthony Jensen. is a 88 y.o. male with a hx of CAD multiple cardiac interventions CKD hypertensive heart disease hyperlipidemia heart block  dual-chamber Medtronic pacemaker and congestive heart failure last seen 08/02/2020.  His most recent echocardiogram 11/09/2019 showed an EF of 30 to 35% with global hypokinesia moderate left atrial enlargement and no finding of aortic stenosis  last seen 02/07/2021.  His last pacemaker check 2 years ago 09/15/2019 showed a projected battery of 55 V is ventricular paced 72% of the time atrially paced 100% of the time  Compliance with diet, lifestyle and medications: Yes  He is supervised and his caregivers with him.  He has done well weight is stable blood pressure is controlled no chest pain palpitation or syncope. He is overdue for device check we will do a download before he leaves the office Most recent labs 02/04/2021 show a hemoglobin of 10.4 creatinine 1.9 potassium 3.9 last cholesterol 268 LDL 160 Past Medical History:  Diagnosis Date   Acquired hypothyroidism 12/20/2016   ANEMIA 02/21/2010   Qualifier: Diagnosis of  By: Nelson-Smith CMA (AAMA), Dottie     Benign nodular prostatic hyperplasia 11/21/2014   Carotid bruit 09/29/2011   Chronic combined systolic and diastolic heart failure ([place]) 05/21/2015   Chronic renal insufficiency 09/28/2011   Coronary artery disease 09/28/2011   Overview:  a.  Exertional chest discomfort/arm pain with dyspnea.    b.  Bare Metal Stent to proximal left circumflex, (09/1998).    c.  Cypher Stent to mid LAD, (06/03/2005).  d.  MRI, (06/10/2010):  demonstrating normal LVEF with evidence of mild--moderate infarct of  inferolateral subendocardial region with peri-infarct ischemia in the left circumflex distribution.    e.  Status post cardiac catheterization (06/23/2010) with drug-eluting stent placed at 60-80% lesion in mid-RCA.    f.  Status post cardiac cath (11/14/2010) to evaluate recurrent chest pain/arm pain and external Adenosine stress nuclear positive for septal ischemia.  Catheterization demonstrated patent stents with no change in coronary anatomy from previous  study of 06/23/2010.     Essential hypertension 12/20/2016   Gout 09/28/2011   History of alcohol abuse 09/28/2011   History of nephrolithiasis 09/28/2011   History of rheumatic fever 09/28/2011   History of tobacco abuse 09/28/2011   Hyperlipidemia 01/14/2017   Hypertension 09/28/2011   Mixed dyslipidemia 12/20/2016   Multinodular thyroid 09/28/2011   Near syncope 12/20/2016   Pacemaker reprogramming/check 05/21/2015   Overview:  Dual chamber 12/24/14   Peptic ulcer disease 09/28/2011   Presence of stent in coronary artery in patient with coronary artery disease 12/20/2016   Prostate nodule 11/21/2014   Sinus node dysfunction ([place]) 12/24/2014    Past Surgical History:  Procedure Laterality Date   CORONARY ANGIOPLASTY     PACEMAKER IMPLANT     PENILE PROSTHESIS IMPLANT     THYROID SURGERY      Current Medications: Current Meds  Medication Sig   amLODipine (NORVASC) 5 MG tablet Take 5 mg by mouth daily.   Aspirin Buf,CaCarb-MgCarb-MgO, (BUFFERIN LOW DOSE) 81 MG TABS Take 2 tablets by mouth daily.   clopidogrel (PLAVIX) 75 MG tablet Take 75 mg by mouth daily.   furosemide (LASIX) 40 MG tablet Take 1 tablet (40 mg total) by mouth daily.   levothyroxine (SYNTHROID, LEVOTHROID) 125 MCG tablet Take 125 mcg by mouth daily.    nebivolol (BYSTOLIC) 5 MG tablet Take 0.5 tablets (2.5 mg total) by mouth daily.   potassium chloride SA (KLOR-CON) 20 MEQ tablet Take 0.5 tablets (10 mEq total) by mouth daily.   tamsulosin (FLOMAX) 0.4 MG CAPS capsule Take 0.4 mg by mouth at bedtime.   Vitamin D, Ergocalciferol, (DRISDOL) 1.25 MG (50000 UNIT) CAPS capsule Take 50,000 Units by mouth 2 (two) times a week.     Allergies:   Lorazepam and Crestor [rosuvastatin calcium]   Social History   Socioeconomic History   Marital status: Widowed    Spouse name: Not on file   Number of children: Not on file   Years of education: Not on file   Highest education level: Not on file  Occupational History   Not on  file  Tobacco Use   Smoking status: Former    Types: Cigarettes   Smokeless tobacco: Never  Vaping Use   Vaping Use: Never used  Substance and Sexual Activity   Alcohol use: No   Drug use: No   Sexual activity: Not Currently  Other Topics Concern   Not on file  Social History Narrative   Not on file   Social Determinants of Health   Financial Resource Strain: Not on file  Food Insecurity: Not on file  Transportation Needs: Not on file  Physical Activity: Not on file  Stress: Not on file  Social Connections: Not on file     Family History: The patient's family history includes CAD in his father. ROS:   Please see the history of present illness.    All other systems reviewed and are negative.  EKGs/Labs/Other Studies Reviewed:    The following studies were reviewed today:  EKG:  EKG ordered today and personally reviewed.  The ekg ordered today demonstrates dual-chamber paced 75 bpm    Physical Exam:    VS:  BP 106/66 (BP Location: Right Arm)    Pulse 75    Ht 5\' 6"  (1.676 m)    Wt 145 lb (65.8 kg)    SpO2 99%    BMI 23.40 kg/m     Wt Readings from Last 3 Encounters:  08/11/21 145 lb (65.8 kg)  02/07/21 140 lb (63.5 kg)  08/02/20 147 lb 9.6 oz (67 kg)     GEN: Looks his age well nourished, well developed in no acute distress HEENT: Normal NECK: No JVD; No carotid bruits LYMPHATICS: No lymphadenopathy CARDIAC: RRR, no murmurs, rubs, gallops RESPIRATORY:  Clear to auscultation without rales, wheezing or rhonchi  ABDOMEN: Soft, non-tender, non-distended MUSCULOSKELETAL:  No edema; No deformity  SKIN: Warm and dry NEUROLOGIC:  Alert and oriented x 3 PSYCHIATRIC:  Normal affect    Signed, Shirlee More, MD  08/11/2021 3:27 PM    [facility]

## 2021-08-11 ENCOUNTER — Ambulatory Visit (INDEPENDENT_AMBULATORY_CARE_PROVIDER_SITE_OTHER): Payer: Medicare Other | Admitting: Cardiology

## 2021-08-11 ENCOUNTER — Other Ambulatory Visit: Payer: Self-pay

## 2021-08-11 ENCOUNTER — Encounter: Payer: Self-pay | Admitting: Cardiology

## 2021-08-11 VITALS — BP 106/66 | HR 75 | Ht 66.0 in | Wt 145.0 lb

## 2021-08-11 DIAGNOSIS — Z950 Presence of cardiac pacemaker: Secondary | ICD-10-CM | POA: Diagnosis not present

## 2021-08-11 DIAGNOSIS — I25119 Atherosclerotic heart disease of native coronary artery with unspecified angina pectoris: Secondary | ICD-10-CM

## 2021-08-11 DIAGNOSIS — E782 Mixed hyperlipidemia: Secondary | ICD-10-CM | POA: Diagnosis not present

## 2021-08-11 DIAGNOSIS — I130 Hypertensive heart and chronic kidney disease with heart failure and stage 1 through stage 4 chronic kidney disease, or unspecified chronic kidney disease: Secondary | ICD-10-CM | POA: Diagnosis not present

## 2021-08-11 NOTE — Patient Instructions (Signed)
Medication Instructions:  Your physician recommends that you continue on your current medications as directed. Please refer to the Current Medication list given to you today.  *If you need a refill on your cardiac medications before your next appointment, please call your pharmacy*   Lab Work: Your physician recommends that you return for lab work in: Today for CBC, BMP and Pro BNP  If you have labs (blood work) drawn today and your tests are completely normal, you will receive your results only by: MyChart Message (if you have MyChart) OR A paper copy in the mail If you have any lab test that is abnormal or we need to change your treatment, we will call you to review the results.   Testing/Procedures: NONE   Follow-Up: At [facility], you and your health needs are our priority.  As part of our continuing mission to provide you with exceptional heart care, we have created designated Provider Care Teams.  These Care Teams include your primary Cardiologist (physician) and Advanced Practice Providers (APPs -  Physician Assistants and Nurse Practitioners) who all work together to provide you with the care you need, when you need it.  We recommend signing up for the patient portal called "MyChart".  Sign up information is provided on this After Visit Summary.  MyChart is used to connect with patients for Virtual Visits (Telemedicine).  Patients are able to view lab/test results, encounter notes, upcoming appointments, etc.  Non-urgent messages can be sent to your provider as well.   To learn more about what you can do with MyChart, go to [email or web address].    Your next appointment:   6 month(s)  The format for your next appointment:   In Person  Provider:   Shirlee More, MD    Other Instructions

## 2021-08-12 ENCOUNTER — Telehealth: Payer: Self-pay

## 2021-08-12 LAB — BASIC METABOLIC PANEL
BUN/Creatinine Ratio: 13 (ref 10–24)
BUN: 23 mg/dL (ref 10–36)
CO2: 19 mmol/L — ABNORMAL LOW (ref 20–29)
Calcium: 9.3 mg/dL (ref 8.6–10.2)
Chloride: 102 mmol/L (ref 96–106)
Creatinine, Ser: 1.81 mg/dL — ABNORMAL HIGH (ref 0.76–1.27)
Glucose: 112 mg/dL — ABNORMAL HIGH (ref 70–99)
Potassium: 3.9 mmol/L (ref 3.5–5.2)
Sodium: 142 mmol/L (ref 134–144)
eGFR: 34 mL/min/{1.73_m2} — ABNORMAL LOW (ref >59)

## 2021-08-12 LAB — CBC
Hematocrit: 31.8 % — ABNORMAL LOW (ref 37.5–51.0)
Hemoglobin: 10.5 g/dL — ABNORMAL LOW (ref 13.0–17.7)
MCH: 27.1 pg (ref 26.6–33.0)
MCHC: 33 g/dL (ref 31.5–35.7)
MCV: 82 fL (ref 79–97)
Platelets: 213 10*3/uL (ref 150–450)
RBC: 3.87 x10E6/uL — ABNORMAL LOW (ref 4.14–5.80)
RDW: 14.5 % (ref 11.6–15.4)
WBC: 6.4 10*3/uL (ref 3.4–10.8)

## 2021-08-12 LAB — PRO B NATRIURETIC PEPTIDE: NT-Pro BNP: 1505 pg/mL — ABNORMAL HIGH (ref 0–486)

## 2021-08-12 NOTE — Telephone Encounter (Signed)
-----   Message from Richardo Priest, MD sent at 08/12/2021  2:29 PM EST ----- [Stable result no changes

## 2021-08-12 NOTE — Telephone Encounter (Signed)
Tried calling patient. No answer and no voicemail set up for me to leave a message. 

## 2021-08-13 ENCOUNTER — Telehealth: Payer: Self-pay

## 2021-08-13 NOTE — Telephone Encounter (Signed)
-----   Message from Richardo Priest, MD sent at 08/12/2021  2:29 PM EST ----- [Stable result no changes

## 2021-08-13 NOTE — Telephone Encounter (Signed)
Spoke with patient regarding results and recommendation.  Patient verbalizes understanding and is agreeable to plan of care. Advised patient to call back with any issues or concerns.  

## 2021-08-28 DIAGNOSIS — R55 Syncope and collapse: Secondary | ICD-10-CM | POA: Diagnosis not present

## 2021-08-28 DIAGNOSIS — Z87891 Personal history of nicotine dependence: Secondary | ICD-10-CM | POA: Diagnosis not present

## 2021-08-28 DIAGNOSIS — R531 Weakness: Secondary | ICD-10-CM | POA: Diagnosis not present

## 2021-08-28 DIAGNOSIS — N179 Acute kidney failure, unspecified: Secondary | ICD-10-CM | POA: Diagnosis not present

## 2021-08-28 DIAGNOSIS — I509 Heart failure, unspecified: Secondary | ICD-10-CM | POA: Diagnosis not present

## 2021-08-28 DIAGNOSIS — G459 Transient cerebral ischemic attack, unspecified: Secondary | ICD-10-CM | POA: Diagnosis not present

## 2021-08-28 DIAGNOSIS — N183 Chronic kidney disease, stage 3 unspecified: Secondary | ICD-10-CM | POA: Diagnosis not present

## 2021-08-28 DIAGNOSIS — R410 Disorientation, unspecified: Secondary | ICD-10-CM | POA: Diagnosis not present

## 2021-08-28 DIAGNOSIS — I130 Hypertensive heart and chronic kidney disease with heart failure and stage 1 through stage 4 chronic kidney disease, or unspecified chronic kidney disease: Secondary | ICD-10-CM | POA: Diagnosis not present

## 2021-08-28 DIAGNOSIS — R42 Dizziness and giddiness: Secondary | ICD-10-CM | POA: Diagnosis not present

## 2021-08-28 DIAGNOSIS — Z950 Presence of cardiac pacemaker: Secondary | ICD-10-CM | POA: Diagnosis not present

## 2021-08-28 DIAGNOSIS — Z8673 Personal history of transient ischemic attack (TIA), and cerebral infarction without residual deficits: Secondary | ICD-10-CM | POA: Diagnosis not present

## 2021-08-28 DIAGNOSIS — I252 Old myocardial infarction: Secondary | ICD-10-CM | POA: Diagnosis not present

## 2021-08-28 DIAGNOSIS — I2510 Atherosclerotic heart disease of native coronary artery without angina pectoris: Secondary | ICD-10-CM | POA: Diagnosis not present

## 2021-08-28 DIAGNOSIS — E860 Dehydration: Secondary | ICD-10-CM | POA: Diagnosis not present

## 2021-09-02 DIAGNOSIS — L602 Onychogryphosis: Secondary | ICD-10-CM | POA: Diagnosis not present

## 2021-09-03 DIAGNOSIS — L602 Onychogryphosis: Secondary | ICD-10-CM

## 2021-09-10 DIAGNOSIS — L853 Xerosis cutis: Secondary | ICD-10-CM | POA: Diagnosis not present

## 2021-09-10 DIAGNOSIS — L300 Nummular dermatitis: Secondary | ICD-10-CM | POA: Diagnosis not present

## 2021-09-10 DIAGNOSIS — C44329 Squamous cell carcinoma of skin of other parts of face: Secondary | ICD-10-CM | POA: Diagnosis not present

## 2021-09-10 DIAGNOSIS — D485 Neoplasm of uncertain behavior of skin: Secondary | ICD-10-CM | POA: Diagnosis not present

## 2021-09-10 DIAGNOSIS — L738 Other specified follicular disorders: Secondary | ICD-10-CM | POA: Diagnosis not present

## 2021-09-10 DIAGNOSIS — Z85828 Personal history of other malignant neoplasm of skin: Secondary | ICD-10-CM | POA: Diagnosis not present

## 2021-10-13 ENCOUNTER — Ambulatory Visit (INDEPENDENT_AMBULATORY_CARE_PROVIDER_SITE_OTHER): Payer: Medicare Other | Admitting: Cardiology

## 2021-10-13 ENCOUNTER — Encounter: Payer: Self-pay | Admitting: Cardiology

## 2021-10-13 VITALS — BP 110/66 | HR 74 | Ht 65.0 in | Wt 143.4 lb

## 2021-10-13 DIAGNOSIS — Z950 Presence of cardiac pacemaker: Secondary | ICD-10-CM | POA: Diagnosis not present

## 2021-10-13 DIAGNOSIS — I25119 Atherosclerotic heart disease of native coronary artery with unspecified angina pectoris: Secondary | ICD-10-CM | POA: Diagnosis not present

## 2021-10-13 DIAGNOSIS — I495 Sick sinus syndrome: Secondary | ICD-10-CM

## 2021-10-13 NOTE — Progress Notes (Signed)
? ?Electrophysiology Office Note ? ? ?Date:  10/13/2021  ? ?ID:  Anthony Jensen., DOB [birth year], MRN [id number] ? ?PCP:  [name], MD  ?Cardiologist:  Bettina Gavia ?Primary Electrophysiologist: Anthony Krizek [name], MD   ? ?No chief complaint on file. ? ?  ?History of Present Illness: ?Anthony Jensen. is a 88 y.o. male who is being seen today for the evaluation of CHF at the request of [name], MD. Presenting today for electrophysiology evaluation.   ? ?History significant for hypertension, chronic combined systolic and diastolic heart failure, coronary artery disease with stable angina, and hyperlipidemia.  He is status post Medtronic dual-chamber pacemaker implanted for sick sinus syndrome. ? ?Today, denies symptoms of palpitations, chest pain, shortness of breath, orthopnea, PND, lower extremity edema, claudication, dizziness, presyncope, syncope, bleeding, or neurologic sequela. The patient is tolerating medications without difficulties.  Since last being seen he has done well.  He has had no chest pain or shortness of breath.  Is able to do all of his daily activities.  He is overall happy with how he has been feeling. ? ?Past Medical History:  ?Diagnosis Date  ? Acquired hypothyroidism 12/20/2016  ? ANEMIA 02/21/2010  ? Qualifier: Diagnosis of  By: Nelson-Smith CMA (AAMA), Dottie    ? Benign nodular prostatic hyperplasia 11/21/2014  ? Carotid bruit 09/29/2011  ? Chronic combined systolic and diastolic heart failure ([place]) 05/21/2015  ? Chronic renal insufficiency 09/28/2011  ? Coronary artery disease 09/28/2011  ? Overview:  a.  Exertional chest discomfort/arm pain with dyspnea.    b.  Bare Metal Stent to proximal left circumflex, (09/1998).    c.  Cypher Stent to mid LAD, (06/03/2005).  d.  MRI, (06/10/2010):  demonstrating normal LVEF with evidence of mild--moderate infarct of inferolateral subendocardial region with peri-infarct ischemia in the left circumflex distribution.    e.   Status post cardiac catheterization (06/23/2010) with drug-eluting stent placed at 60-80% lesion in mid-RCA.    f.  Status post cardiac cath (11/14/2010) to evaluate recurrent chest pain/arm pain and external Adenosine stress nuclear positive for septal ischemia.  Catheterization demonstrated patent stents with no change in coronary anatomy from previous study of 06/23/2010.    ? Essential hypertension 12/20/2016  ? Gout 09/28/2011  ? History of alcohol abuse 09/28/2011  ? History of nephrolithiasis 09/28/2011  ? History of rheumatic fever 09/28/2011  ? History of tobacco abuse 09/28/2011  ? Hyperlipidemia 01/14/2017  ? Hypertension 09/28/2011  ? Mixed dyslipidemia 12/20/2016  ? Multinodular thyroid 09/28/2011  ? Near syncope 12/20/2016  ? Pacemaker reprogramming/check 05/21/2015  ? Overview:  Dual chamber 12/24/14  ? Peptic ulcer disease 09/28/2011  ? Presence of stent in coronary artery in patient with coronary artery disease 12/20/2016  ? Prostate nodule 11/21/2014  ? Sinus node dysfunction ([place]) 12/24/2014  ? ?Past Surgical History:  ?Procedure Laterality Date  ? CORONARY ANGIOPLASTY    ? PACEMAKER IMPLANT    ? PENILE PROSTHESIS IMPLANT    ? THYROID SURGERY    ? ? ? ?Current Outpatient Medications  ?Medication Sig Dispense Refill  ? amLODipine (NORVASC) 5 MG tablet Take 5 mg by mouth daily.    ? Aspirin Buf,CaCarb-MgCarb-MgO, (BUFFERIN LOW DOSE) 81 MG TABS Take 2 tablets by mouth daily.    ? clopidogrel (PLAVIX) 75 MG tablet Take 75 mg by mouth daily.    ? furosemide (LASIX) 40 MG tablet Take 1 tablet (40 mg total) by mouth daily. 90 tablet 0  ?  levothyroxine (SYNTHROID, LEVOTHROID) 125 MCG tablet Take 125 mcg by mouth daily.     ? nebivolol (BYSTOLIC) 5 MG tablet Take 0.5 tablets (2.5 mg total) by mouth daily. 45 tablet 0  ? potassium chloride SA (KLOR-CON) 20 MEQ tablet Take 0.5 tablets (10 mEq total) by mouth daily. 45 tablet 0  ? tamsulosin (FLOMAX) 0.4 MG CAPS capsule Take 0.4 mg by mouth at bedtime.    ? Vitamin D,  Ergocalciferol, (DRISDOL) 1.25 MG (50000 UNIT) CAPS capsule Take 50,000 Units by mouth 2 (two) times a week.    ? ?No current facility-administered medications for this visit.  ? ? ?Allergies:   Lorazepam and Crestor [rosuvastatin calcium]  ? ?Social History:  The patient  reports that he has quit smoking. His smoking use included cigarettes. He has never used smokeless tobacco. He reports that he does not drink alcohol and does not use drugs.  ? ?Family History:  The patient's family history includes CAD in his father.  ? ?ROS:  Please see the history of present illness.   Otherwise, review of systems is positive for none.   All other systems are reviewed and negative.  ? ?PHYSICAL EXAM: ?VS:  BP 110/66   Pulse 74   Ht '5\' 5"'$  (1.651 m)   Wt 143 lb 6.4 oz (65 kg)   SpO2 98%   BMI 23.86 kg/m?  , BMI Body mass index is 23.86 kg/m?. ?GEN: Well nourished, well developed, in no acute distress  ?HEENT: normal  ?Neck: no JVD, carotid bruits, or masses ?Cardiac: RRR; no murmurs, rubs, or gallops,no edema  ?Respiratory:  clear to auscultation bilaterally, normal work of breathing ?GI: soft, nontender, nondistended, + BS ?MS: no deformity or atrophy  ?Skin: warm and dry, device site well healed ?Neuro:  Strength and sensation are intact ?Psych: euthymic mood, full affect ? ?EKG:  EKG is ordered today. ?Personal review of the ekg ordered shows AV paced ? ?Personal review of the device interrogation today. Results in [place]  ? ? ?Recent Labs: ?08/11/2021: BUN 23; Creatinine, Ser 1.81; Hemoglobin 10.5; NT-Pro BNP 1,505; Platelets 213; Potassium 3.9; Sodium 142  ? ? ?Lipid Panel  ?   ?Component Value Date/Time  ? CHOL 191 08/21/2018 0419  ? CHOL 215 (H) 06/20/2018 1645  ? TRIG 85 08/21/2018 0419  ? HDL 58 08/21/2018 0419  ? HDL 70 06/20/2018 1645  ? CHOLHDL 3.3 08/21/2018 0419  ? VLDL 17 08/21/2018 0419  ? [place] 116 (H) 08/21/2018 0419  ? [place] 117 (H) 06/20/2018 1645  ? ? ? ?Wt Readings from Last 3 Encounters:  ?10/13/21  143 lb 6.4 oz (65 kg)  ?08/11/21 145 lb (65.8 kg)  ?02/07/21 140 lb (63.5 kg)  ?  ? ? ?Other studies Reviewed: ?Additional studies/ records that were reviewed today include: Cardiac cath 04/2016  ?Review of the above records today demonstrates:  ?LMCA: 0%. ?LAD: ?  Lesion on Dist LAD: 60% stenosis. ?LCx: ?  Lesion on Mid CX: 30% stenosis. ?RCA: ?  Lesion on R PDA: 60% stenosis. ? ? ?ASSESSMENT AND PLAN: ? ?1.  Sick sinus syndrome: Status post Medtronic dual-chamber pacemaker.  Device functioning appropriately.  No changes at this time. ? ?2.  Hypertension: Currently well controlled ? ?3.  Coronary artery disease: Has chronic stable angina.  Continue current management with Bystolic, aspirin, Plavix. ? ? ?Current medicines are reviewed at length with the patient today.   ?The patient does not have concerns regarding his medicines.  The following changes  were made today: None ? ?Labs/ tests ordered today include:  ?No orders of the defined types were placed in this encounter. ? ? ? ?Disposition:   FU with Bekka Qian 1 year ? ?Signed, ?Kaleah Hagemeister [name], MD  ?10/13/2021 4:58 PM    ? ?[facility] ?[address] ?[address] ?[place] [address] ?[phone] (office) ?[phone] (fax)  ?

## 2021-10-13 NOTE — Patient Instructions (Signed)
Medication Instructions:  ?Your physician recommends that you continue on your current medications as directed. Please refer to the Current Medication list given to you today. ? ?*If you need a refill on your cardiac medications before your next appointment, please call your pharmacy* ? ? ?Lab Work: ?None ordered. ? ?If you have labs (blood work) drawn today and your tests are completely normal, you will receive your results only by: ?MyChart Message (if you have MyChart) OR ?A paper copy in the mail ?If you have any lab test that is abnormal or we need to change your treatment, we will call you to review the results. ? ? ?Testing/Procedures: ?None ordered. ? ? ? ?Follow-Up: ?At [facility], you and your health needs are our priority.  As part of our continuing mission to provide you with exceptional heart care, we have created designated Provider Care Teams.  These Care Teams include your primary Cardiologist (physician) and Advanced Practice Providers (APPs -  Physician Assistants and Nurse Practitioners) who all work together to provide you with the care you need, when you need it. ? ?We recommend signing up for the patient portal called "MyChart".  Sign up information is provided on this After Visit Summary.  MyChart is used to connect with patients for Virtual Visits (Telemedicine).  Patients are able to view lab/test results, encounter notes, upcoming appointments, etc.  Non-urgent messages can be sent to your provider as well.   ?To learn more about what you can do with MyChart, go to [email or web address].   ? ?Your next appointment:   ?12 month(s) ? ?The format for your next appointment:   ?In Person ? ?Provider:   ?Allegra Lai, MD{ ? ? ?Important Information About Sugar ? ? ? ? ?  ?Important Information About Sugar ? ? ? ? ?  ?

## 2021-10-14 NOTE — Addendum Note (Signed)
Addended by: Jeremy Johann on: 10/14/2021 11:25 AM ? ? Modules accepted: Orders ? ?

## 2021-10-21 DIAGNOSIS — S51011A Laceration without foreign body of right elbow, initial encounter: Secondary | ICD-10-CM | POA: Diagnosis not present

## 2021-10-21 DIAGNOSIS — S61412A Laceration without foreign body of left hand, initial encounter: Secondary | ICD-10-CM | POA: Diagnosis not present

## 2021-10-22 DIAGNOSIS — S51011A Laceration without foreign body of right elbow, initial encounter: Secondary | ICD-10-CM | POA: Diagnosis not present

## 2021-10-22 DIAGNOSIS — S6991XA Unspecified injury of right wrist, hand and finger(s), initial encounter: Secondary | ICD-10-CM | POA: Diagnosis not present

## 2021-10-22 DIAGNOSIS — M7989 Other specified soft tissue disorders: Secondary | ICD-10-CM | POA: Diagnosis not present

## 2021-10-22 DIAGNOSIS — S62114A Nondisplaced fracture of triquetrum [cuneiform] bone, right wrist, initial encounter for closed fracture: Secondary | ICD-10-CM | POA: Diagnosis not present

## 2021-10-22 DIAGNOSIS — S51811A Laceration without foreign body of right forearm, initial encounter: Secondary | ICD-10-CM | POA: Diagnosis not present

## 2021-10-28 DIAGNOSIS — S62111A Displaced fracture of triquetrum [cuneiform] bone, right wrist, initial encounter for closed fracture: Secondary | ICD-10-CM | POA: Diagnosis not present

## 2021-11-05 ENCOUNTER — Encounter: Payer: Self-pay | Admitting: Allergy and Immunology

## 2021-11-05 ENCOUNTER — Ambulatory Visit (INDEPENDENT_AMBULATORY_CARE_PROVIDER_SITE_OTHER): Payer: Medicare Other | Admitting: Allergy and Immunology

## 2021-11-05 VITALS — BP 112/64 | HR 68 | Resp 18 | Ht 65.0 in | Wt 138.0 lb

## 2021-11-05 DIAGNOSIS — L299 Pruritus, unspecified: Secondary | ICD-10-CM | POA: Diagnosis not present

## 2021-11-05 DIAGNOSIS — R3911 Hesitancy of micturition: Secondary | ICD-10-CM

## 2021-11-05 DIAGNOSIS — R768 Other specified abnormal immunological findings in serum: Secondary | ICD-10-CM

## 2021-11-05 DIAGNOSIS — L989 Disorder of the skin and subcutaneous tissue, unspecified: Secondary | ICD-10-CM | POA: Diagnosis not present

## 2021-11-05 DIAGNOSIS — I25119 Atherosclerotic heart disease of native coronary artery with unspecified angina pectoris: Secondary | ICD-10-CM

## 2021-11-05 DIAGNOSIS — R634 Abnormal weight loss: Secondary | ICD-10-CM | POA: Diagnosis not present

## 2021-11-05 MED ORDER — MOMETASONE FUROATE 0.1 % EX OINT: TOPICAL_OINTMENT | CUTANEOUS | 5 refills | Status: DC

## 2021-11-05 NOTE — Progress Notes (Signed)
?[place] ? ? ?Dear Dr. Lin Landsman, ? ?Thank you for referring [name]. to the [facility] on 11/05/2021.  ? ?Below is a summation of this patient's evaluation and recommendations. ? ?Thank you for your referral. I will keep you informed about this patient's response to treatment.  ? ?If you have any questions please do not hesitate to contact me.  ? ?Sincerely, ? ?Jiles Prows, MD ?Allergy / Immunology ?[facility] ? ? ?______________________________________________________________________ ? ? ? ?NEW PATIENT NOTE ? ?Referring Provider: [name], MD ?Primary Provider: [name], MD ?Date of office visit: 11/05/2021 ?   ?Subjective:  ? ?Chief Complaint:  Anthony Jensen. (DOB: [birth year]) is a 88 y.o. male who presents to the clinic on 11/05/2021 with a chief complaint of Pruritis ?.    ? ?HPI: Anthony Jensen presents to this clinic in evaluation of itching and weight loss. ? ?For the past 6 weeks has had progressive itching involving his anterior chest, upper arms, and upper back.  He is excoriating his skin.  He has no associated systemic or constitutional symptoms.  He does not note an obvious provoking factor giving rise to this issue.  He has not really changed any medications, over-the-counter supplements, environmental exposure inside the house, and he does not have symptoms suggesting an ongoing infectious disease. ? ?He has lost weight over the course of the past few months.  He weighed 147 pounds on 02 August 2020 and 145 pounds on 11 August 2021 and 143 on 13 October 2021 and today he weighs 138. ? ?He has no symptoms revolving around his upper respiratory tract, lower respiratory tract, GI tract, urinary tract.  His last colonoscopy was approximately 12 years ago.  He cannot remember the last time he had a PSA checked. ? ?Past Medical  History:  ?Diagnosis Date  ? Acquired hypothyroidism 12/20/2016  ? ANEMIA 02/21/2010  ? Qualifier: Diagnosis of  By: Nelson-Smith CMA (AAMA), Dottie    ? Benign nodular prostatic hyperplasia 11/21/2014  ? Carotid bruit 09/29/2011  ? Chronic combined systolic and diastolic heart failure ([place]) 05/21/2015  ? Chronic renal insufficiency 09/28/2011  ? Coronary artery disease 09/28/2011  ? Overview:  a.  Exertional chest discomfort/arm pain with dyspnea.    b.  Bare Metal Stent to proximal left circumflex, (09/1998).    c.  Cypher Stent to mid LAD, (06/03/2005).  d.  MRI, (06/10/2010):  demonstrating normal LVEF with evidence of mild--moderate infarct of inferolateral subendocardial region with peri-infarct ischemia in the left circumflex distribution.    e.  Status post cardiac catheterization (06/23/2010) with drug-eluting stent placed at 60-80% lesion in mid-RCA.    f.  Status post cardiac cath (11/14/2010) to evaluate recurrent chest pain/arm pain and external Adenosine stress nuclear positive for septal ischemia.  Catheterization demonstrated patent stents with no change in coronary anatomy from previous study of 06/23/2010.    ? Essential hypertension 12/20/2016  ? Gout 09/28/2011  ? History of alcohol abuse 09/28/2011  ? History of nephrolithiasis 09/28/2011  ? History of rheumatic fever 09/28/2011  ? History of tobacco abuse 09/28/2011  ? Hyperlipidemia 01/14/2017  ? Hypertension 09/28/2011  ? Mixed dyslipidemia 12/20/2016  ? Multinodular thyroid 09/28/2011  ? Near syncope 12/20/2016  ? Pacemaker reprogramming/check 05/21/2015  ? Overview:  Dual chamber 12/24/14  ? Peptic ulcer disease 09/28/2011  ?  Presence of stent in coronary artery in patient with coronary artery disease 12/20/2016  ? Prostate nodule 11/21/2014  ? Sinus node dysfunction ([place]) 12/24/2014  ? ? ?Past Surgical History:  ?Procedure Laterality Date  ? CORONARY ANGIOPLASTY    ? PACEMAKER IMPLANT    ? PENILE PROSTHESIS IMPLANT    ? THYROID SURGERY    ? ? ?Allergies as of  11/05/2021   ? ?   Reactions  ? Lorazepam Other (See Comments)  ? Made him crazy  ? Crestor [rosuvastatin Calcium] Other (See Comments)  ? myalgias  ? ?  ? ?  ?Medication List  ? ? ?amLODipine 5 MG tablet ?Commonly known as: NORVASC ?Take 5 mg by mouth daily. ?  ?Bufferin Low Dose 81 MG Tabs ?Generic drug: Aspirin Buf(CaCarb-MgCarb-MgO) ?Take 2 tablets by mouth daily. ?  ?clopidogrel 75 MG tablet ?Commonly known as: PLAVIX ?Take 75 mg by mouth daily. ?  ?furosemide 40 MG tablet ?Commonly known as: LASIX ?Take 1 tablet (40 mg total) by mouth daily. ?  ?levothyroxine 125 MCG tablet ?Commonly known as: SYNTHROID ?Take 125 mcg by mouth daily. ?  ?nebivolol 5 MG tablet ?Commonly known as: Bystolic ?Take 0.5 tablets (2.5 mg total) by mouth daily. ?  ?potassium chloride SA 20 MEQ tablet ?Commonly known as: KLOR-CON M ?Take 0.5 tablets (10 mEq total) by mouth daily. ?  ?tamsulosin 0.4 MG Caps capsule ?Commonly known as: FLOMAX ?Take 0.4 mg by mouth at bedtime. ?  ?Vitamin D (Ergocalciferol) 1.25 MG (50000 UNIT) Caps capsule ?Commonly known as: DRISDOL ?Take 50,000 Units by mouth 2 (two) times a week. ?  ? ?Review of systems negative except as noted in HPI / PMHx or noted below: ? ?Review of Systems  ?Constitutional: Negative.   ?HENT: Negative.    ?Eyes: Negative.   ?Respiratory: Negative.    ?Cardiovascular: Negative.   ?Gastrointestinal: Negative.   ?Genitourinary: Negative.   ?Musculoskeletal: Negative.   ?Skin: Negative.   ?Neurological: Negative.   ?Endo/Heme/Allergies: Negative.   ?Psychiatric/Behavioral: Negative.    ? ?Family History  ?Problem Relation Age of Onset  ? CAD Father   ? ? ?Social History  ? ?Socioeconomic History  ? Marital status: Widowed  ?  Spouse name: Not on file  ? Number of children: Not on file  ? Years of education: Not on file  ? Highest education level: Not on file  ?Occupational History  ? Not on file  ?Tobacco Use  ? Smoking status: Former  ?  Types: Cigarettes  ? Smokeless tobacco: Never   ?Vaping Use  ? Vaping Use: Never used  ?Substance and Sexual Activity  ? Alcohol use: No  ? Drug use: No  ? Sexual activity: Not Currently  ?Other Topics Concern  ? Not on file  ?Social History Narrative  ? Not on file  ? ?Environmental and Social history ? ?Lives in a house with a dry environment, no animals located inside the household, no carpet in the bedroom, plastic on the bed, plastic on the pillow, no smoking ongoing inside the household. ? ?Objective:  ? ?Vitals:  ? 11/05/21 1353  ?BP: 112/64  ?Pulse: 68  ?Resp: 18  ?SpO2: 98%  ? ?Height: '5\' 5"'$  (165.1 cm) ?Weight: 138 lb (62.6 kg) ? ?Physical Exam ?Constitutional:   ?   Appearance: He is not diaphoretic.  ?HENT:  ?   Head: Normocephalic.  ?   Right Ear: Tympanic membrane, ear canal and external ear normal.  ?   Left Ear: Tympanic  membrane, ear canal and external ear normal.  ?   Nose: Nose normal. No mucosal edema or rhinorrhea.  ?   Mouth/Throat:  ?   Pharynx: Uvula midline. No oropharyngeal exudate.  ?Eyes:  ?   Conjunctiva/sclera: Conjunctivae normal.  ?Neck:  ?   Thyroid: No thyromegaly.  ?   Trachea: Trachea normal. No tracheal tenderness or tracheal deviation.  ?Cardiovascular:  ?   Rate and Rhythm: Normal rate and regular rhythm.  ?   Heart sounds: Normal heart sounds, S1 normal and S2 normal. No murmur heard. ?Pulmonary:  ?   Effort: No respiratory distress.  ?   Breath sounds: Normal breath sounds. No stridor. No wheezing or rales.  ?Lymphadenopathy:  ?   Head:  ?   Right side of head: No tonsillar adenopathy.  ?   Left side of head: No tonsillar adenopathy.  ?   Cervical: No cervical adenopathy.  ?Skin: ?   Findings: No erythema or rash (Multiple excoriated papules involving anterior chest and upper back.).  ?   Nails: There is no clubbing.  ?Neurological:  ?   Mental Status: He is alert.  ? ? ?Diagnostics: Allergy skin tests were not performed.  ? ?Results of blood tests obtained 11 August 2021 identifies WBC 6.4, hemoglobin 10.5, platelet  14.5, creatinine 1.81 mg/DL ? ?Results of blood tests obtained 29 July 2010 identifies IgG 735 mg/DL, IgM 48 mg/DL, IgA 180 mg/DL. ? ?Results of blood tests obtained 03 November 2019 identifies TSH 57.2 ?IU/mL

## 2021-11-05 NOTE — Patient Instructions (Addendum)
?  1.  Blood - CBC w/d, CMP, TSH, FT4, thyroid peroxidase, SED, CRP, PSA, IgA/G/M. Urine-U/A ? ?2. Shower followed by mometasone 0.1% ointment while wet daily ? ?3. Cetirizine 10 mg - 1 tablet 1-2 times per day (MAX = 20 mg/day) ? ?4. Further evaluation??? Yes, if still with itching ? ?

## 2021-11-06 ENCOUNTER — Encounter: Payer: Self-pay | Admitting: Allergy and Immunology

## 2021-11-06 DIAGNOSIS — Z6822 Body mass index (BMI) 22.0-22.9, adult: Secondary | ICD-10-CM | POA: Diagnosis not present

## 2021-11-06 DIAGNOSIS — Z0000 Encounter for general adult medical examination without abnormal findings: Secondary | ICD-10-CM | POA: Diagnosis not present

## 2021-11-06 DIAGNOSIS — E039 Hypothyroidism, unspecified: Secondary | ICD-10-CM | POA: Diagnosis not present

## 2021-11-06 DIAGNOSIS — L299 Pruritus, unspecified: Secondary | ICD-10-CM | POA: Diagnosis not present

## 2021-11-06 DIAGNOSIS — I259 Chronic ischemic heart disease, unspecified: Secondary | ICD-10-CM | POA: Diagnosis not present

## 2021-11-09 LAB — TSH+FREE T4
Free T4: 1.49 ng/dL (ref 0.82–1.77)
TSH: 3.03 u[IU]/mL (ref 0.450–4.500)

## 2021-11-09 LAB — CBC WITH DIFFERENTIAL
Basophils Absolute: 0.1 10*3/uL (ref 0.0–0.2)
Basos: 1 %
EOS (ABSOLUTE): 0.3 10*3/uL (ref 0.0–0.4)
Eos: 4 %
Hematocrit: 37.7 % (ref 37.5–51.0)
Hemoglobin: 12.3 g/dL — ABNORMAL LOW (ref 13.0–17.7)
Immature Grans (Abs): 0 10*3/uL (ref 0.0–0.1)
Immature Granulocytes: 0 %
Lymphocytes Absolute: 1.2 10*3/uL (ref 0.7–3.1)
Lymphs: 15 %
MCH: 26.9 pg (ref 26.6–33.0)
MCHC: 32.6 g/dL (ref 31.5–35.7)
MCV: 82 fL (ref 79–97)
Monocytes Absolute: 0.7 10*3/uL (ref 0.1–0.9)
Monocytes: 9 %
Neutrophils Absolute: 5.7 10*3/uL (ref 1.4–7.0)
Neutrophils: 71 %
RBC: 4.58 x10E6/uL (ref 4.14–5.80)
RDW: 14.3 % (ref 11.6–15.4)
WBC: 8 10*3/uL (ref 3.4–10.8)

## 2021-11-09 LAB — COMPREHENSIVE METABOLIC PANEL
ALT: 8 IU/L (ref 0–44)
AST: 18 IU/L (ref 0–40)
Albumin/Globulin Ratio: 1.6 (ref 1.2–2.2)
Albumin: 4.2 g/dL (ref 3.5–4.6)
Alkaline Phosphatase: 89 IU/L (ref 44–121)
BUN/Creatinine Ratio: 17 (ref 10–24)
BUN: 33 mg/dL (ref 10–36)
Bilirubin Total: 0.4 mg/dL (ref 0.0–1.2)
CO2: 23 mmol/L (ref 20–29)
Calcium: 9.2 mg/dL (ref 8.6–10.2)
Chloride: 98 mmol/L (ref 96–106)
Creatinine, Ser: 1.92 mg/dL — ABNORMAL HIGH (ref 0.76–1.27)
Globulin, Total: 2.7 g/dL (ref 1.5–4.5)
Glucose: 109 mg/dL — ABNORMAL HIGH (ref 70–99)
Potassium: 3.7 mmol/L (ref 3.5–5.2)
Sodium: 140 mmol/L (ref 134–144)
Total Protein: 6.9 g/dL (ref 6.0–8.5)
eGFR: 32 mL/min/{1.73_m2} — ABNORMAL LOW (ref >59)

## 2021-11-09 LAB — URINALYSIS
Bilirubin, UA: NEGATIVE
Glucose, UA: NEGATIVE
Ketones, UA: NEGATIVE
Nitrite, UA: NEGATIVE
RBC, UA: NEGATIVE
Specific Gravity, UA: 1.02 (ref 1.005–1.030)
Urobilinogen, Ur: 0.2 mg/dL (ref 0.2–1.0)
pH, UA: 5.5 (ref 5.0–7.5)

## 2021-11-09 LAB — IGG, IGA, IGM
IgA/Immunoglobulin A, Serum: 332 mg/dL (ref 61–437)
IgG (Immunoglobin G), Serum: 981 mg/dL (ref 603–1613)
IgM (Immunoglobulin M), Srm: 63 mg/dL (ref 15–143)

## 2021-11-09 LAB — PSA: Prostate Specific Ag, Serum: <0.1 ng/mL (ref 0.0–4.0)

## 2021-11-09 LAB — SEDIMENTATION RATE: Sed Rate: 29 mm/hr (ref 0–30)

## 2021-11-09 LAB — C-REACTIVE PROTEIN: CRP: 9 mg/L (ref 0–10)

## 2021-11-09 LAB — THYROID PEROXIDASE ANTIBODY: Thyroperoxidase Ab SerPl-aCnc: <9 IU/mL (ref 0–34)

## 2021-12-10 DIAGNOSIS — L853 Xerosis cutis: Secondary | ICD-10-CM | POA: Diagnosis not present

## 2021-12-10 DIAGNOSIS — L570 Actinic keratosis: Secondary | ICD-10-CM | POA: Diagnosis not present

## 2021-12-10 DIAGNOSIS — C44329 Squamous cell carcinoma of skin of other parts of face: Secondary | ICD-10-CM | POA: Diagnosis not present

## 2021-12-10 DIAGNOSIS — L579 Skin changes due to chronic exposure to nonionizing radiation, unspecified: Secondary | ICD-10-CM | POA: Diagnosis not present

## 2022-02-02 NOTE — Progress Notes (Deleted)
Cardiology Office Note:    Date:  02/02/2022   ID:  Anthony Jensen., DOB 07-24-1925, MRN [id number]  PCP:  [name], MD  Cardiologist:  Shirlee More, MD    Referring MD: [name], MD    ASSESSMENT:    No diagnosis found. PLAN:    In order of problems listed above:  ***   Next appointment: ***   Medication Adjustments/Labs and Tests Ordered: Current medicines are reviewed at length with the patient today.  Concerns regarding medicines are outlined above.  No orders of the defined types were placed in this encounter.  No orders of the defined types were placed in this encounter.   No chief complaint on file.   History of Present Illness:    Anthony Jensen. is a 88 y.o. male with a hx of CAD with multiple previous PCI's hypertensive heart disease with heart failure chronic kidney disease hyperlipidemia heart block with dual-chamber Medtronic pacemaker most recent echocardiogram in 2021 showed EF of 30 to 35% last seen 08/11/2021.  He was seen in follow-up by EP Dr. Curt Bears 10/13/2021 with office pacemaker interrogation with normal device function and stable chronic CAD. Compliance with diet, lifestyle and medications: *** Past Medical History:  Diagnosis Date   Acquired hypothyroidism 12/20/2016   ANEMIA 02/21/2010   Qualifier: Diagnosis of  By: Nelson-Smith CMA (AAMA), Dottie     Benign nodular prostatic hyperplasia 11/21/2014   Carotid bruit 09/29/2011   Chronic combined systolic and diastolic heart failure ([place]) 05/21/2015   Chronic renal insufficiency 09/28/2011   Coronary artery disease 09/28/2011   Overview:  a.  Exertional chest discomfort/arm pain with dyspnea.    b.  Bare Metal Stent to proximal left circumflex, (09/1998).    c.  Cypher Stent to mid LAD, (06/03/2005).  d.  MRI, (06/10/2010):  demonstrating normal LVEF with evidence of mild--moderate infarct of inferolateral subendocardial region with peri-infarct ischemia in  the left circumflex distribution.    e.  Status post cardiac catheterization (06/23/2010) with drug-eluting stent placed at 60-80% lesion in mid-RCA.    f.  Status post cardiac cath (11/14/2010) to evaluate recurrent chest pain/arm pain and external Adenosine stress nuclear positive for septal ischemia.  Catheterization demonstrated patent stents with no change in coronary anatomy from previous study of 06/23/2010.     Coronary artery disease involving native coronary artery of native heart with angina pectoris ([place]) 09/28/2011   Overview:  a.  Exertional chest discomfort/arm pain with dyspnea.    b.  Bare Metal Stent to proximal left circumflex, (09/1998).    c.  Cypher Stent to mid LAD, (06/03/2005).  d.  MRI, (06/10/2010):  demonstrating normal LVEF with evidence of mild--moderate infarct of inferolateral subendocardial region with peri-infarct ischemia in the left circumflex distribution.    e.  Status post cardiac cat   Essential hypertension 12/20/2016   Gout 09/28/2011   History of alcohol abuse 09/28/2011   History of nephrolithiasis 09/28/2011   History of rheumatic fever 09/28/2011   History of tobacco abuse 09/28/2011   Hyperlipidemia 01/14/2017   Hypertension 09/28/2011   Hypertensive heart disease with heart failure ([place]) 12/20/2016   Mixed dyslipidemia 12/20/2016   Multinodular thyroid 09/28/2011   Near syncope 12/20/2016   Nocturia 11/21/2014   Overgrown toenails 09/03/2021   Pacemaker 05/21/2015   Overview:  Dual chamber 12/24/14   Pacemaker reprogramming/check 05/21/2015   Overview:  Dual chamber 12/24/14   Peptic ulcer disease 09/28/2011   Presence  of stent in coronary artery in patient with coronary artery disease 12/20/2016   Prostate nodule 11/21/2014   Sinus node dysfunction ([place]) 12/24/2014   Stroke ([place]) -due to small vessel disease s/p TPA 08/20/2018    Past Surgical History:  Procedure Laterality Date   CORONARY ANGIOPLASTY     PACEMAKER IMPLANT     PENILE PROSTHESIS IMPLANT      THYROID SURGERY      Current Medications: No outpatient medications have been marked as taking for the 02/03/22 encounter (Appointment) with Richardo Priest, MD.     Allergies:   Lorazepam and Crestor [rosuvastatin calcium]   Social History   Socioeconomic History   Marital status: Widowed    Spouse name: Not on file   Number of children: Not on file   Years of education: Not on file   Highest education level: Not on file  Occupational History   Not on file  Tobacco Use   Smoking status: Former    Types: Cigarettes   Smokeless tobacco: Never  Vaping Use   Vaping Use: Never used  Substance and Sexual Activity   Alcohol use: No   Drug use: No   Sexual activity: Not Currently  Other Topics Concern   Not on file  Social History Narrative   Not on file   Social Determinants of Health   Financial Resource Strain: Not on file  Food Insecurity: Not on file  Transportation Needs: Not on file  Physical Activity: Not on file  Stress: Not on file  Social Connections: Not on file     Family History: The patient's ***family history includes CAD in his father. ROS:   Please see the history of present illness.    All other systems reviewed and are negative.  EKGs/Labs/Other Studies Reviewed:    The following studies were reviewed today:  Cardiac cath 04/2016  Review of the above records today demonstrates:  LMCA: 0%. LAD:   Lesion on Dist LAD: 60% stenosis. LCx:   Lesion on Mid CX: 30% stenosis. RCA:   Lesion on R PDA: 60% stenosis. EKG:  EKG ordered today and personally reviewed.  The ekg ordered today demonstrates ***  Recent Labs: 08/11/2021: NT-Pro BNP 1,505; Platelets 213 11/05/2021: ALT 8; BUN 33; Creatinine, Ser 1.92; Hemoglobin 12.3; Potassium 3.7; Sodium 140; TSH 3.030  Recent Lipid Panel    Component Value Date/Time   CHOL 191 08/21/2018 0419   CHOL 215 (H) 06/20/2018 1645   TRIG 85 08/21/2018 0419   HDL 58 08/21/2018 0419   HDL 70 06/20/2018 1645    CHOLHDL 3.3 08/21/2018 0419   VLDL 17 08/21/2018 0419   LDLCALC 116 (H) 08/21/2018 0419   LDLCALC 117 (H) 06/20/2018 1645    Physical Exam:    VS:  There were no vitals taken for this visit.    Wt Readings from Last 3 Encounters:  11/05/21 138 lb (62.6 kg)  10/13/21 143 lb 6.4 oz (65 kg)  08/11/21 145 lb (65.8 kg)     GEN: *** Well nourished, well developed in no acute distress HEENT: Normal NECK: No JVD; No carotid bruits LYMPHATICS: No lymphadenopathy CARDIAC: ***RRR, no murmurs, rubs, gallops RESPIRATORY:  Clear to auscultation without rales, wheezing or rhonchi  ABDOMEN: Soft, non-tender, non-distended MUSCULOSKELETAL:  No edema; No deformity  SKIN: Warm and dry NEUROLOGIC:  Alert and oriented x 3 PSYCHIATRIC:  Normal affect    Signed, Shirlee More, MD  02/02/2022 5:33 PM    [facility]

## 2022-02-03 ENCOUNTER — Other Ambulatory Visit: Payer: Self-pay

## 2022-02-03 ENCOUNTER — Ambulatory Visit: Payer: Medicare Other | Admitting: Cardiology

## 2022-02-03 ENCOUNTER — Telehealth: Payer: Self-pay | Admitting: Cardiology

## 2022-02-03 MED ORDER — NEBIVOLOL HCL 5 MG PO TABS: 2.5000 mg | ORAL_TABLET | Freq: Every day | ORAL | 3 refills | Status: DC

## 2022-02-03 NOTE — Telephone Encounter (Signed)
*  STAT* If patient is at the pharmacy, call can be transferred to refill team.   1. Which medications need to be refilled? (please list name of each medication and dose if known)  nebivolol (BYSTOLIC) 5 MG tablet  2. Which pharmacy/location (including street and city if local pharmacy) is medication to be sent to? [facility] in [place]  3. Do they need a 30 day or 90 day supply? 90 day

## 2022-02-03 NOTE — Telephone Encounter (Signed)
Called patient and informed him that his Bystolic medication was sent to his pharmacy. Patient had no further questions at this time.

## 2022-02-04 ENCOUNTER — Ambulatory Visit (INDEPENDENT_AMBULATORY_CARE_PROVIDER_SITE_OTHER): Payer: Medicare Other | Admitting: Allergy and Immunology

## 2022-02-04 ENCOUNTER — Ambulatory Visit: Payer: Medicare Other | Admitting: Allergy and Immunology

## 2022-02-04 ENCOUNTER — Telehealth: Payer: Self-pay | Admitting: *Deleted

## 2022-02-04 ENCOUNTER — Encounter: Payer: Self-pay | Admitting: Allergy and Immunology

## 2022-02-04 VITALS — BP 178/64 | HR 80 | Temp 98.3°F | Resp 20

## 2022-02-04 DIAGNOSIS — I25119 Atherosclerotic heart disease of native coronary artery with unspecified angina pectoris: Secondary | ICD-10-CM

## 2022-02-04 DIAGNOSIS — L989 Disorder of the skin and subcutaneous tissue, unspecified: Secondary | ICD-10-CM

## 2022-02-04 DIAGNOSIS — L299 Pruritus, unspecified: Secondary | ICD-10-CM | POA: Diagnosis not present

## 2022-02-04 NOTE — Telephone Encounter (Signed)
Received via fax- placed on Dr. Lyndon Code desk.

## 2022-02-04 NOTE — Patient Instructions (Addendum)
  1.  We will obtain radiologist evaluation of chest x-ray from [facility]  2.  We will arrange for a visit with [facility] for scraping / biopsy tomorrow  3.  You will contact Dr. Bettina Gavia and let him know you stopped your Plavix  4.  You will continue on prednisone and doxycycline  5. Shower followed by mometasone 0.1% ointment while wet daily  6. Cetirizine 10 mg - 1 tablet 1-2 times per day (MAX = 20 mg/day)  7. Further evaluation???

## 2022-02-04 NOTE — Progress Notes (Unsigned)
[facility] - [place] [facility] [place]   Follow-up Note  Referring Provider: [name], MD Primary Provider: [name], MD Date of Office Visit: 02/04/2022  Subjective:   Anthony Jensen. (DOB: [birth year]) is a 88 y.o. male who returns to the [facility] on 02/04/2022 in re-evaluation of the following:  HPI: Dr. Rhona Raider returns to this clinic in evaluation of pruritic inflammatory disorder.  I last saw him in this clinic on 05 Nov 2021.  He is continue to have significant pruritus and continues to have a dermatitis and he went to see his primary care doctor 3 days ago who obtained a chest x-ray which apparently identified a "pneumonia" and has started him on prednisone and doxycycline.  In addition, he has discontinued almost all of his medications other than his bladder medication including the use of Plavix.  While eliminating all of his medications and using prednisone and doxycycline over the course of the past 3 days he is much better regarding his itchiness.  Allergies as of 02/04/2022       Reactions   Lorazepam Other (See Comments)   Made him crazy   Crestor [rosuvastatin Calcium] Other (See Comments)   myalgias        Medication List    amLODipine 5 MG tablet Commonly known as: NORVASC Take 5 mg by mouth daily.   Bufferin Low Dose 81 MG Tabs Generic drug: Aspirin Buf(CaCarb-MgCarb-MgO) Take 2 tablets by mouth daily.   clopidogrel 75 MG tablet Commonly known as: PLAVIX Take 75 mg by mouth daily.   furosemide 40 MG tablet Commonly known as: LASIX Take 1 tablet (40 mg total) by mouth daily.   levothyroxine 125 MCG tablet Commonly known as: SYNTHROID Take 125 mcg by mouth daily.   mometasone 0.1 % ointment Commonly known as: ELOCON Shower followed by mometasone 0.1% ointment while wet daily   nebivolol 5 MG tablet Commonly known as: Bystolic Take 0.5 tablets (2.5 mg total) by mouth daily.    Potassium Chloride ER 20 MEQ Tbcr Take 0.5 tablets by mouth daily.   potassium chloride SA 20 MEQ tablet Commonly known as: KLOR-CON M Take 0.5 tablets (10 mEq total) by mouth daily.   tamsulosin 0.4 MG Caps capsule Commonly known as: FLOMAX Take 0.4 mg by mouth at bedtime.   Vitamin D (Ergocalciferol) 1.25 MG (50000 UNIT) Caps capsule Commonly known as: DRISDOL Take 50,000 Units by mouth 2 (two) times a week.        Past Medical History:  Diagnosis Date   Acquired hypothyroidism 12/20/2016   ANEMIA 02/21/2010   Qualifier: Diagnosis of  By: Nelson-Smith CMA (AAMA), Dottie     Benign nodular prostatic hyperplasia 11/21/2014   Carotid bruit 09/29/2011   Chronic combined systolic and diastolic heart failure ([place]) 05/21/2015   Chronic renal insufficiency 09/28/2011   Coronary artery disease 09/28/2011   Overview:  a.  Exertional chest discomfort/arm pain with dyspnea.    b.  Bare Metal Stent to proximal left circumflex, (09/1998).    c.  Cypher Stent to mid LAD, (06/03/2005).  d.  MRI, (06/10/2010):  demonstrating normal LVEF with evidence of mild--moderate infarct of inferolateral subendocardial region with peri-infarct ischemia in the left circumflex distribution.    e.  Status post cardiac catheterization (06/23/2010) with drug-eluting stent placed at 60-80% lesion in mid-RCA.    f.  Status post cardiac cath (11/14/2010) to evaluate recurrent chest pain/arm pain and external Adenosine  stress nuclear positive for septal ischemia.  Catheterization demonstrated patent stents with no change in coronary anatomy from previous study of 06/23/2010.     Coronary artery disease involving native coronary artery of native heart with angina pectoris ([place]) 09/28/2011   Overview:  a.  Exertional chest discomfort/arm pain with dyspnea.    b.  Bare Metal Stent to proximal left circumflex, (09/1998).    c.  Cypher Stent to mid LAD, (06/03/2005).  d.  MRI, (06/10/2010):  demonstrating normal LVEF with evidence  of mild--moderate infarct of inferolateral subendocardial region with peri-infarct ischemia in the left circumflex distribution.    e.  Status post cardiac cat   Essential hypertension 12/20/2016   Gout 09/28/2011   History of alcohol abuse 09/28/2011   History of nephrolithiasis 09/28/2011   History of rheumatic fever 09/28/2011   History of tobacco abuse 09/28/2011   Hyperlipidemia 01/14/2017   Hypertension 09/28/2011   Hypertensive heart disease with heart failure ([place]) 12/20/2016   Mixed dyslipidemia 12/20/2016   Multinodular thyroid 09/28/2011   Near syncope 12/20/2016   Nocturia 11/21/2014   Overgrown toenails 09/03/2021   Pacemaker 05/21/2015   Overview:  Dual chamber 12/24/14   Pacemaker reprogramming/check 05/21/2015   Overview:  Dual chamber 12/24/14   Peptic ulcer disease 09/28/2011   Presence of stent in coronary artery in patient with coronary artery disease 12/20/2016   Prostate nodule 11/21/2014   Sinus node dysfunction ([place]) 12/24/2014   Stroke ([place]) -due to small vessel disease s/p TPA 08/20/2018    Past Surgical History:  Procedure Laterality Date   CORONARY ANGIOPLASTY     PACEMAKER IMPLANT     PENILE PROSTHESIS IMPLANT     THYROID SURGERY      Review of systems negative except as noted in HPI / PMHx or noted below:  Review of Systems  Constitutional: Negative.   HENT: Negative.    Eyes: Negative.   Respiratory: Negative.    Cardiovascular: Negative.   Gastrointestinal: Negative.   Genitourinary: Negative.   Musculoskeletal: Negative.   Skin: Negative.   Neurological: Negative.   Endo/Heme/Allergies: Negative.   Psychiatric/Behavioral: Negative.       Objective:   Vitals:   02/04/22 1528  BP: (!) 178/64  Pulse: 80  Resp: 20  Temp: 98.3 F (36.8 C)  SpO2: 98%          Physical Exam Skin:    Findings: Rash (Diffuse papular dermatitis with large indurated erythematous plaques across upper and lower extremities, anterior chest, upper back, lower back.)  present.     Diagnostics: Results of blood tests obtained 05 Nov 2021 identifies creatinine 1.92 mg/DL, AST 18 U/L, ALT 8U/L, WBC 8.0, absolute eosinophil 300, absolute lymphocyte 1200, hemoglobin 12.3, TSH 3.030 IU/mL, free T41.49 NG/DL, thyroid peroxidase antibody less than 9U/mL, sed rate 29 CRP 9 prostate-specific antigen less than 0.1 NG/mL IgG 981 mg/DL, IgA 332 mg/DL, IgM 63 mg/DL.  Results of urine analysis obtained 05 Nov 2021 identifies trace protein, negative RBCs.     Assessment and Plan:   No diagnosis found.  Patient Instructions   1.  We will obtain radiologist evaluation of chest x-ray from [facility] family physicians  2.  We will arrange for a visit with [facility] for scraping / biopsy tomorrow  3.  You will contact Dr. Bettina Gavia and let him know you stopped your Plavix  4.  You will continue on prednisone and doxycycline  5. Shower followed by mometasone 0.1% ointment while wet daily  6. Cetirizine 10 mg - 1 tablet 1-2 times per day (MAX = 20 mg/day)  7. Further evaluation???     Allena Katz, MD Allergy / Immunology [place]

## 2022-02-04 NOTE — Telephone Encounter (Signed)
Called [facility] Dr. Welford Roche nurse- I left a message for a return call. He is a mutual pt and we need a copy of his chest x-ray, per Dr. Neldon Mc.

## 2022-02-05 ENCOUNTER — Encounter: Payer: Self-pay | Admitting: Allergy and Immunology

## 2022-02-05 DIAGNOSIS — L299 Pruritus, unspecified: Secondary | ICD-10-CM | POA: Diagnosis not present

## 2022-02-05 DIAGNOSIS — L300 Nummular dermatitis: Secondary | ICD-10-CM | POA: Diagnosis not present

## 2022-02-05 DIAGNOSIS — T7840XA Allergy, unspecified, initial encounter: Secondary | ICD-10-CM | POA: Diagnosis not present

## 2022-02-05 DIAGNOSIS — D485 Neoplasm of uncertain behavior of skin: Secondary | ICD-10-CM | POA: Diagnosis not present

## 2022-02-11 ENCOUNTER — Encounter: Payer: Self-pay | Admitting: *Deleted

## 2022-02-23 DIAGNOSIS — N1832 Chronic kidney disease, stage 3b: Secondary | ICD-10-CM | POA: Diagnosis not present

## 2022-02-23 DIAGNOSIS — R82998 Other abnormal findings in urine: Secondary | ICD-10-CM | POA: Diagnosis not present

## 2022-02-23 DIAGNOSIS — Z87442 Personal history of urinary calculi: Secondary | ICD-10-CM | POA: Diagnosis not present

## 2022-02-23 DIAGNOSIS — N390 Urinary tract infection, site not specified: Secondary | ICD-10-CM | POA: Diagnosis not present

## 2022-02-23 DIAGNOSIS — R809 Proteinuria, unspecified: Secondary | ICD-10-CM | POA: Diagnosis not present

## 2022-02-23 DIAGNOSIS — I129 Hypertensive chronic kidney disease with stage 1 through stage 4 chronic kidney disease, or unspecified chronic kidney disease: Secondary | ICD-10-CM | POA: Diagnosis not present

## 2022-02-23 DIAGNOSIS — I255 Ischemic cardiomyopathy: Secondary | ICD-10-CM | POA: Diagnosis not present

## 2022-02-25 DIAGNOSIS — L209 Atopic dermatitis, unspecified: Secondary | ICD-10-CM | POA: Diagnosis not present

## 2022-02-25 DIAGNOSIS — L299 Pruritus, unspecified: Secondary | ICD-10-CM | POA: Diagnosis not present

## 2022-03-25 ENCOUNTER — Telehealth: Payer: Self-pay | Admitting: *Deleted

## 2022-03-25 NOTE — Patient Outreach (Signed)
  Care Coordination   03/25/2022 Name: Anthony Jensen [facility]. MRN: [id number] DOB: 07/23/1925   Care Coordination Outreach Attempts:  An unsuccessful telephone outreach was attempted today to offer the patient information about available care coordination services as a benefit of their health plan.   Follow Up Plan:  Additional outreach attempts will be made to offer the patient care coordination information and services.   Encounter Outcome:  No Answer  Care Coordination Interventions Activated:  No   Care Coordination Interventions:  No, not indicated    Eduard Clos MSW, LCSW Licensed Clinical Social Worker      [phone]

## 2022-05-11 DIAGNOSIS — Z23 Encounter for immunization: Secondary | ICD-10-CM | POA: Diagnosis not present

## 2022-06-18 DIAGNOSIS — Z6823 Body mass index (BMI) 23.0-23.9, adult: Secondary | ICD-10-CM | POA: Diagnosis not present

## 2022-06-18 DIAGNOSIS — E559 Vitamin D deficiency, unspecified: Secondary | ICD-10-CM | POA: Diagnosis not present

## 2022-06-18 DIAGNOSIS — E039 Hypothyroidism, unspecified: Secondary | ICD-10-CM | POA: Diagnosis not present

## 2022-06-18 DIAGNOSIS — N189 Chronic kidney disease, unspecified: Secondary | ICD-10-CM | POA: Diagnosis not present

## 2022-06-22 DIAGNOSIS — K219 Gastro-esophageal reflux disease without esophagitis: Secondary | ICD-10-CM | POA: Diagnosis not present

## 2022-06-22 DIAGNOSIS — J205 Acute bronchitis due to respiratory syncytial virus: Secondary | ICD-10-CM | POA: Diagnosis not present

## 2022-06-22 DIAGNOSIS — Z87891 Personal history of nicotine dependence: Secondary | ICD-10-CM | POA: Diagnosis not present

## 2022-06-22 DIAGNOSIS — R059 Cough, unspecified: Secondary | ICD-10-CM | POA: Diagnosis not present

## 2022-06-22 DIAGNOSIS — I10 Essential (primary) hypertension: Secondary | ICD-10-CM | POA: Diagnosis not present

## 2022-06-27 DIAGNOSIS — E7800 Pure hypercholesterolemia, unspecified: Secondary | ICD-10-CM | POA: Diagnosis not present

## 2022-06-27 DIAGNOSIS — J440 Chronic obstructive pulmonary disease with acute lower respiratory infection: Secondary | ICD-10-CM | POA: Diagnosis present

## 2022-06-27 DIAGNOSIS — I130 Hypertensive heart and chronic kidney disease with heart failure and stage 1 through stage 4 chronic kidney disease, or unspecified chronic kidney disease: Secondary | ICD-10-CM | POA: Diagnosis present

## 2022-06-27 DIAGNOSIS — I447 Left bundle-branch block, unspecified: Secondary | ICD-10-CM | POA: Diagnosis not present

## 2022-06-27 DIAGNOSIS — D631 Anemia in chronic kidney disease: Secondary | ICD-10-CM | POA: Diagnosis present

## 2022-06-27 DIAGNOSIS — R059 Cough, unspecified: Secondary | ICD-10-CM | POA: Diagnosis not present

## 2022-06-27 DIAGNOSIS — R062 Wheezing: Secondary | ICD-10-CM | POA: Diagnosis not present

## 2022-06-27 DIAGNOSIS — Z87442 Personal history of urinary calculi: Secondary | ICD-10-CM | POA: Diagnosis not present

## 2022-06-27 DIAGNOSIS — R9431 Abnormal electrocardiogram [ECG] [EKG]: Secondary | ICD-10-CM | POA: Diagnosis not present

## 2022-06-27 DIAGNOSIS — Z7902 Long term (current) use of antithrombotics/antiplatelets: Secondary | ICD-10-CM | POA: Diagnosis not present

## 2022-06-27 DIAGNOSIS — Z87891 Personal history of nicotine dependence: Secondary | ICD-10-CM | POA: Diagnosis not present

## 2022-06-27 DIAGNOSIS — E1122 Type 2 diabetes mellitus with diabetic chronic kidney disease: Secondary | ICD-10-CM | POA: Diagnosis present

## 2022-06-27 DIAGNOSIS — M1990 Unspecified osteoarthritis, unspecified site: Secondary | ICD-10-CM | POA: Diagnosis not present

## 2022-06-27 DIAGNOSIS — N400 Enlarged prostate without lower urinary tract symptoms: Secondary | ICD-10-CM | POA: Diagnosis not present

## 2022-06-27 DIAGNOSIS — I252 Old myocardial infarction: Secondary | ICD-10-CM | POA: Diagnosis not present

## 2022-06-27 DIAGNOSIS — Z8673 Personal history of transient ischemic attack (TIA), and cerebral infarction without residual deficits: Secondary | ICD-10-CM | POA: Diagnosis not present

## 2022-06-27 DIAGNOSIS — N183 Chronic kidney disease, stage 3 unspecified: Secondary | ICD-10-CM | POA: Diagnosis present

## 2022-06-27 DIAGNOSIS — I5023 Acute on chronic systolic (congestive) heart failure: Secondary | ICD-10-CM | POA: Diagnosis present

## 2022-06-27 DIAGNOSIS — Z888 Allergy status to other drugs, medicaments and biological substances status: Secondary | ICD-10-CM | POA: Diagnosis not present

## 2022-06-27 DIAGNOSIS — Z8719 Personal history of other diseases of the digestive system: Secondary | ICD-10-CM | POA: Diagnosis not present

## 2022-06-27 DIAGNOSIS — K219 Gastro-esophageal reflux disease without esophagitis: Secondary | ICD-10-CM | POA: Diagnosis not present

## 2022-06-27 DIAGNOSIS — I2510 Atherosclerotic heart disease of native coronary artery without angina pectoris: Secondary | ICD-10-CM | POA: Diagnosis present

## 2022-06-27 DIAGNOSIS — R531 Weakness: Secondary | ICD-10-CM | POA: Diagnosis not present

## 2022-06-27 DIAGNOSIS — I10 Essential (primary) hypertension: Secondary | ICD-10-CM | POA: Diagnosis not present

## 2022-06-27 DIAGNOSIS — E039 Hypothyroidism, unspecified: Secondary | ICD-10-CM | POA: Diagnosis not present

## 2022-06-27 DIAGNOSIS — Z79899 Other long term (current) drug therapy: Secondary | ICD-10-CM | POA: Diagnosis not present

## 2022-06-27 DIAGNOSIS — J121 Respiratory syncytial virus pneumonia: Secondary | ICD-10-CM | POA: Diagnosis present

## 2022-06-27 DIAGNOSIS — R0602 Shortness of breath: Secondary | ICD-10-CM | POA: Diagnosis not present

## 2022-06-27 DIAGNOSIS — Z7982 Long term (current) use of aspirin: Secondary | ICD-10-CM | POA: Diagnosis not present

## 2022-06-27 DIAGNOSIS — J159 Unspecified bacterial pneumonia: Secondary | ICD-10-CM | POA: Diagnosis present

## 2022-06-27 DIAGNOSIS — J189 Pneumonia, unspecified organism: Secondary | ICD-10-CM | POA: Diagnosis not present

## 2022-07-08 ENCOUNTER — Ambulatory Visit: Payer: Medicare Other | Admitting: Cardiology

## 2022-07-08 NOTE — Progress Notes (Unsigned)
Cardiology Office Note:    Date:  07/09/2022   ID:  Anthony Pew [name], DOB 03-22-1926, MRN [id number]  PCP:  [name], MD  Cardiologist:  Shirlee More, MD    Referring MD: [name], MD    ASSESSMENT:    1. Coronary artery disease involving native coronary artery of native heart with angina pectoris ([place])   2. Hypertensive heart and chronic kidney disease with heart failure and stage 1 through stage 4 chronic kidney disease, or chronic kidney disease ([place])   3. Sinus node dysfunction (HCC)   4. Pacemaker   5. Mixed dyslipidemia    PLAN:    In order of problems listed above:  Dr. Rhona Raider has done exceptionally well from a cardiology perspective stable CAD having no anginal discomfort heart failure compensated without fluid overload and stable pacemaker sinus node dysfunction although he is also long overdue for pacemaker evaluation and from this point forward I will see him every 6 months more interrogate the device in my office. I will check labs including lipid profile CMP and CBC I be very hesitant in view of his overall frailty to consider any interventional cardiology procedures other than pacemaker generator replacement   Next appointment: 6 months   Medication Adjustments/Labs and Tests Ordered: Current medicines are reviewed at length with the patient today.  Concerns regarding medicines are outlined above.  No orders of the defined types were placed in this encounter.  No orders of the defined types were placed in this encounter.   Chief complaint follow-up CAD heart failure andPlease make   History of Present Illness:    Anthony Erman. is a 89 y.o. male with a hx of CAD with multiple cardiac interventions hypertensive heart disease with heart failure heart block with dual-chamber Medtronic pacemaker hypertension CKD and hyper lipidemia last seen 08/11/2021.  His most recent echocardiogram May 2021 showed EF of 30 to  35%.  Compliance with diet, lifestyle and medications: Yes  Overall is doing well although recently had RSV infection. Some days his blood pressure is low and his caregiver just holds his antihypertensives Not frequent He has not had any episodes of chest pain edema shortness of breath palpitation or syncope He tolerates his statin without muscle pain or weakness Most recent labs 08/11/2021 platelets 213,000 normal ALT AST hemoglobin 12.3 creatinine 1.92 with significant CKD Past Medical History:  Diagnosis Date   Acquired hypothyroidism 12/20/2016   ANEMIA 02/21/2010   Qualifier: Diagnosis of  By: Nelson-Smith CMA (AAMA), Dottie     Benign nodular prostatic hyperplasia 11/21/2014   Carotid bruit 09/29/2011   Chronic combined systolic and diastolic heart failure ([place]) 05/21/2015   Chronic renal insufficiency 09/28/2011   Coronary artery disease 09/28/2011   Overview:  a.  Exertional chest discomfort/arm pain with dyspnea.    b.  Bare Metal Stent to proximal left circumflex, (09/1998).    c.  Cypher Stent to mid LAD, (06/03/2005).  d.  MRI, (06/10/2010):  demonstrating normal LVEF with evidence of mild--moderate infarct of inferolateral subendocardial region with peri-infarct ischemia in the left circumflex distribution.    e.  Status post cardiac catheterization (06/23/2010) with drug-eluting stent placed at 60-80% lesion in mid-RCA.    f.  Status post cardiac cath (11/14/2010) to evaluate recurrent chest pain/arm pain and external Adenosine stress nuclear positive for septal ischemia.  Catheterization demonstrated patent stents with no change in coronary anatomy from previous study of 06/23/2010.     Coronary  artery disease involving native coronary artery of native heart with angina pectoris ([place]) 09/28/2011   Overview:  a.  Exertional chest discomfort/arm pain with dyspnea.    b.  Bare Metal Stent to proximal left circumflex, (09/1998).    c.  Cypher Stent to mid LAD, (06/03/2005).  d.  MRI,  (06/10/2010):  demonstrating normal LVEF with evidence of mild--moderate infarct of inferolateral subendocardial region with peri-infarct ischemia in the left circumflex distribution.    e.  Status post cardiac cat   Essential hypertension 12/20/2016   Gout 09/28/2011   History of alcohol abuse 09/28/2011   History of nephrolithiasis 09/28/2011   History of rheumatic fever 09/28/2011   History of tobacco abuse 09/28/2011   Hyperlipidemia 01/14/2017   Hypertension 09/28/2011   Hypertensive heart disease with heart failure ([place]) 12/20/2016   Mixed dyslipidemia 12/20/2016   Multinodular thyroid 09/28/2011   Near syncope 12/20/2016   Nocturia 11/21/2014   Overgrown toenails 09/03/2021   Pacemaker 05/21/2015   Overview:  Dual chamber 12/24/14   Pacemaker reprogramming/check 05/21/2015   Overview:  Dual chamber 12/24/14   Peptic ulcer disease 09/28/2011   Presence of stent in coronary artery in patient with coronary artery disease 12/20/2016   Prostate nodule 11/21/2014   Sinus node dysfunction ([place]) 12/24/2014   Stroke ([place]) -due to small vessel disease s/p TPA 08/20/2018    Past Surgical History:  Procedure Laterality Date   CORONARY ANGIOPLASTY     PACEMAKER IMPLANT     PENILE PROSTHESIS IMPLANT     THYROID SURGERY      Current Medications: Current Meds  Medication Sig   amLODipine (NORVASC) 5 MG tablet Take 5 mg by mouth daily.   Aspirin Buf,CaCarb-MgCarb-MgO, (BUFFERIN LOW DOSE) 81 MG TABS Take 2 tablets by mouth daily.   clopidogrel (PLAVIX) 75 MG tablet Take 75 mg by mouth daily.   furosemide (LASIX) 40 MG tablet Take 1 tablet (40 mg total) by mouth daily.   levothyroxine (SYNTHROID, LEVOTHROID) 125 MCG tablet Take 125 mcg by mouth daily.    mometasone (ELOCON) 0.1 % ointment Shower followed by mometasone 0.1% ointment while wet daily   nebivolol (BYSTOLIC) 5 MG tablet Take 0.5 tablets (2.5 mg total) by mouth daily.   Potassium Chloride ER 20 MEQ TBCR Take 0.5 tablets by mouth daily.    tamsulosin (FLOMAX) 0.4 MG CAPS capsule Take 0.4 mg by mouth at bedtime.   Vitamin D, Ergocalciferol, (DRISDOL) 1.25 MG (50000 UNIT) CAPS capsule Take 50,000 Units by mouth 2 (two) times a week.     Allergies:   Lorazepam and Crestor [rosuvastatin calcium]   Social History   Socioeconomic History   Marital status: Widowed    Spouse name: Not on file   Number of children: Not on file   Years of education: Not on file   Highest education level: Not on file  Occupational History   Not on file  Tobacco Use   Smoking status: Former    Types: Cigarettes   Smokeless tobacco: Never  Vaping Use   Vaping Use: Never used  Substance and Sexual Activity   Alcohol use: No   Drug use: No   Sexual activity: Not Currently  Other Topics Concern   Not on file  Social History Narrative   Not on file   Social Determinants of Health   Financial Resource Strain: Not on file  Food Insecurity: Not on file  Transportation Needs: Not on file  Physical Activity: Not on file  Stress: Not  on file  Social Connections: Not on file     Family History: The patient's family history includes CAD in his father. ROS:   Please see the history of present illness.    All other systems reviewed and are negative.  EKGs/Labs/Other Studies Reviewed:    The following studies were reviewed today:  EKG:  EKG we interrogated his pacemaker he is pacemaker dependent and has 1-1/2 years of projected battery life  Recent Labs: 08/11/2021: NT-Pro BNP 1,505; Platelets 213 11/05/2021: ALT 8; BUN 33; Creatinine, Ser 1.92; Hemoglobin 12.3; Potassium 3.7; Sodium 140; TSH 3.030  Recent Lipid Panel    Component Value Date/Time   CHOL 191 08/21/2018 0419   CHOL 215 (H) 06/20/2018 1645   TRIG 85 08/21/2018 0419   HDL 58 08/21/2018 0419   HDL 70 06/20/2018 1645   CHOLHDL 3.3 08/21/2018 0419   VLDL 17 08/21/2018 0419   LDLCALC 116 (H) 08/21/2018 0419   LDLCALC 117 (H) 06/20/2018 1645    Physical Exam:    VS:  BP  138/70 (BP Location: Right Arm, Patient Position: Sitting)   Pulse 86   Ht '5\' 7"'$  (1.702 m)   Wt 144 lb (65.3 kg)   SpO2 96%   BMI 22.55 kg/m     Wt Readings from Last 3 Encounters:  07/09/22 144 lb (65.3 kg)  11/05/21 138 lb (62.6 kg)  10/13/21 143 lb 6.4 oz (65 kg)     GEN: He looks his age well nourished, well developed in no acute distress HEENT: Normal NECK: No JVD; No carotid bruits LYMPHATICS: No lymphadenopathy CARDIAC: RRR, no murmurs, rubs, gallops RESPIRATORY:  Clear to auscultation without rales, wheezing or rhonchi  ABDOMEN: Soft, non-tender, non-distended MUSCULOSKELETAL:  No edema; No deformity  SKIN: Warm and dry NEUROLOGIC:  Alert and oriented x 3 PSYCHIATRIC:  Normal affect    Signed, Shirlee More, MD  07/09/2022 1:52 PM    [facility]

## 2022-07-09 ENCOUNTER — Ambulatory Visit: Payer: Medicare Other | Attending: Cardiology | Admitting: Cardiology

## 2022-07-09 ENCOUNTER — Encounter: Payer: Self-pay | Admitting: Cardiology

## 2022-07-09 VITALS — BP 138/70 | HR 86 | Ht 67.0 in | Wt 144.0 lb

## 2022-07-09 DIAGNOSIS — I25119 Atherosclerotic heart disease of native coronary artery with unspecified angina pectoris: Secondary | ICD-10-CM | POA: Diagnosis not present

## 2022-07-09 DIAGNOSIS — I495 Sick sinus syndrome: Secondary | ICD-10-CM | POA: Insufficient documentation

## 2022-07-09 DIAGNOSIS — E782 Mixed hyperlipidemia: Secondary | ICD-10-CM | POA: Insufficient documentation

## 2022-07-09 DIAGNOSIS — Z950 Presence of cardiac pacemaker: Secondary | ICD-10-CM | POA: Diagnosis not present

## 2022-07-09 DIAGNOSIS — I130 Hypertensive heart and chronic kidney disease with heart failure and stage 1 through stage 4 chronic kidney disease, or unspecified chronic kidney disease: Secondary | ICD-10-CM | POA: Insufficient documentation

## 2022-07-09 NOTE — Patient Instructions (Signed)
Medication Instructions:  Your physician recommends that you continue on your current medications as directed. Please refer to the Current Medication list given to you today.  *If you need a refill on your cardiac medications before your next appointment, please call your pharmacy*   Lab Work: Your physician recommends that you return for lab work in:   Labs today: CBC, CMP, Lipids  If you have labs (blood work) drawn today and your tests are completely normal, you will receive your results only by: MyChart Message (if you have [place]) OR A paper copy in the mail If you have any lab test that is abnormal or we need to change your treatment, we will call you to review the results.   Testing/Procedures: None   Follow-Up: At [facility], you and your health needs are our priority.  As part of our continuing mission to provide you with exceptional heart care, we have created designated Provider Care Teams.  These Care Teams include your primary Cardiologist (physician) and Advanced Practice Providers (APPs -  Physician Assistants and Nurse Practitioners) who all work together to provide you with the care you need, when you need it.  We recommend signing up for the patient portal called "MyChart".  Sign up information is provided on this After Visit Summary.  MyChart is used to connect with patients for Virtual Visits (Telemedicine).  Patients are able to view lab/test results, encounter notes, upcoming appointments, etc.  Non-urgent messages can be sent to your provider as well.   To learn more about what you can do with MyChart, go to [email or web address].    Your next appointment:   6 month(s)  The format for your next appointment:   In Person  Provider:   Shirlee More, MD    Other Instructions None  Important Information About [place]

## 2022-07-10 DIAGNOSIS — I130 Hypertensive heart and chronic kidney disease with heart failure and stage 1 through stage 4 chronic kidney disease, or unspecified chronic kidney disease: Secondary | ICD-10-CM | POA: Diagnosis not present

## 2022-07-10 DIAGNOSIS — I495 Sick sinus syndrome: Secondary | ICD-10-CM | POA: Diagnosis not present

## 2022-07-10 DIAGNOSIS — E782 Mixed hyperlipidemia: Secondary | ICD-10-CM | POA: Diagnosis not present

## 2022-07-10 DIAGNOSIS — I25119 Atherosclerotic heart disease of native coronary artery with unspecified angina pectoris: Secondary | ICD-10-CM | POA: Diagnosis not present

## 2022-07-10 DIAGNOSIS — Z950 Presence of cardiac pacemaker: Secondary | ICD-10-CM | POA: Diagnosis not present

## 2022-07-10 LAB — LIPID PANEL
Chol/HDL Ratio: 3 ratio (ref 0.0–5.0)
Cholesterol, Total: 192 mg/dL (ref 100–199)
HDL: 63 mg/dL (ref >39)
LDL Chol Calc (NIH): 112 mg/dL — ABNORMAL HIGH (ref 0–99)
Triglycerides: 92 mg/dL (ref 0–149)
VLDL Cholesterol Cal: 17 mg/dL (ref 5–40)

## 2022-07-10 LAB — COMPREHENSIVE METABOLIC PANEL
ALT: 7 IU/L (ref 0–44)
AST: 16 IU/L (ref 0–40)
Albumin/Globulin Ratio: 1.4 (ref 1.2–2.2)
Albumin: 3.6 g/dL (ref 3.6–4.6)
Alkaline Phosphatase: 93 IU/L (ref 44–121)
BUN/Creatinine Ratio: 13 (ref 10–24)
BUN: 24 mg/dL (ref 10–36)
Bilirubin Total: 0.7 mg/dL (ref 0.0–1.2)
CO2: 23 mmol/L (ref 20–29)
Calcium: 8.8 mg/dL (ref 8.6–10.2)
Chloride: 101 mmol/L (ref 96–106)
Creatinine, Ser: 1.78 mg/dL — ABNORMAL HIGH (ref 0.76–1.27)
Globulin, Total: 2.6 g/dL (ref 1.5–4.5)
Glucose: 96 mg/dL (ref 70–99)
Potassium: 3.8 mmol/L (ref 3.5–5.2)
Sodium: 137 mmol/L (ref 134–144)
Total Protein: 6.2 g/dL (ref 6.0–8.5)
eGFR: 35 mL/min/{1.73_m2} — ABNORMAL LOW (ref >59)

## 2022-07-10 LAB — CBC
Hematocrit: 29.9 % — ABNORMAL LOW (ref 37.5–51.0)
Hemoglobin: 9.8 g/dL — ABNORMAL LOW (ref 13.0–17.7)
MCH: 26.3 pg — ABNORMAL LOW (ref 26.6–33.0)
MCHC: 32.8 g/dL (ref 31.5–35.7)
MCV: 80 fL (ref 79–97)
Platelets: 226 10*3/uL (ref 150–450)
RBC: 3.73 x10E6/uL — ABNORMAL LOW (ref 4.14–5.80)
RDW: 14.1 % (ref 11.6–15.4)
WBC: 6.4 10*3/uL (ref 3.4–10.8)

## 2022-07-10 NOTE — Addendum Note (Signed)
Addended by: Edwyna Shell I on: 07/10/2022 02:50 PM   Modules accepted: Orders

## 2022-07-11 LAB — IRON,TIBC AND FERRITIN PANEL
Ferritin: 253 ng/mL (ref 30–400)
Iron Saturation: 18 % (ref 15–55)
Iron: 42 ug/dL (ref 38–169)
Total Iron Binding Capacity: 229 ug/dL — ABNORMAL LOW (ref 250–450)
UIBC: 187 ug/dL (ref 111–343)

## 2022-07-11 LAB — B12 AND FOLATE PANEL
Folate: 13 ng/mL (ref >3.0)
Vitamin B-12: 968 pg/mL (ref 232–1245)

## 2022-07-13 ENCOUNTER — Telehealth: Payer: Self-pay | Admitting: Cardiology

## 2022-07-13 NOTE — Telephone Encounter (Signed)
Patient's caregiver Venida Jarvis and the patient both informed of results

## 2022-07-13 NOTE — Telephone Encounter (Signed)
Pt caregiver, Sherrie, calling for pt's recent lab work

## 2022-08-05 DIAGNOSIS — L300 Nummular dermatitis: Secondary | ICD-10-CM | POA: Diagnosis not present

## 2022-08-05 DIAGNOSIS — L298 Other pruritus: Secondary | ICD-10-CM | POA: Diagnosis not present

## 2022-09-16 DIAGNOSIS — L209 Atopic dermatitis, unspecified: Secondary | ICD-10-CM | POA: Diagnosis not present

## 2022-10-15 ENCOUNTER — Telehealth: Payer: Self-pay

## 2022-10-15 NOTE — Patient Outreach (Signed)
  Care Coordination   Initial Visit Note   10/15/2022 Name: Anthony Jensen [facility]. MRN: [id number] DOB: 05/08/1926  Anthony Jensen. is a 89 y.o. year old male who sees [place], Valrie Hart, MD for primary care. I spoke with  Anthony Jensen. by phone today. Placed call to patient who has provided me permission to speak with Anthony Jensen  What matters to the patients health and wellness today?  Reviewed [facility] program with Anthony Jensen.  Ms. Anthony Jensen reports that patient is doing well. Denies any current needs at this time.     SDOH assessments and interventions completed:  No     Care Coordination Interventions:  No, not indicated   Follow up plan: No further intervention required.   Encounter Outcome:  Pt. Refused   Rowe Pavy, RN, BSN, CEN [facility] [employer] [facility] [phone]

## 2022-12-05 DIAGNOSIS — R110 Nausea: Secondary | ICD-10-CM | POA: Diagnosis not present

## 2022-12-05 DIAGNOSIS — R531 Weakness: Secondary | ICD-10-CM | POA: Diagnosis not present

## 2022-12-05 DIAGNOSIS — E871 Hypo-osmolality and hyponatremia: Secondary | ICD-10-CM | POA: Diagnosis not present

## 2022-12-05 DIAGNOSIS — R5383 Other fatigue: Secondary | ICD-10-CM | POA: Diagnosis not present

## 2022-12-07 DIAGNOSIS — I130 Hypertensive heart and chronic kidney disease with heart failure and stage 1 through stage 4 chronic kidney disease, or unspecified chronic kidney disease: Secondary | ICD-10-CM | POA: Diagnosis not present

## 2022-12-07 DIAGNOSIS — E871 Hypo-osmolality and hyponatremia: Secondary | ICD-10-CM | POA: Diagnosis not present

## 2022-12-07 DIAGNOSIS — Z87891 Personal history of nicotine dependence: Secondary | ICD-10-CM | POA: Diagnosis not present

## 2022-12-07 DIAGNOSIS — E860 Dehydration: Secondary | ICD-10-CM | POA: Diagnosis not present

## 2022-12-07 DIAGNOSIS — J449 Chronic obstructive pulmonary disease, unspecified: Secondary | ICD-10-CM | POA: Diagnosis not present

## 2022-12-07 DIAGNOSIS — J189 Pneumonia, unspecified organism: Secondary | ICD-10-CM | POA: Diagnosis not present

## 2022-12-07 DIAGNOSIS — I509 Heart failure, unspecified: Secondary | ICD-10-CM | POA: Diagnosis not present

## 2022-12-07 DIAGNOSIS — N200 Calculus of kidney: Secondary | ICD-10-CM | POA: Diagnosis not present

## 2022-12-07 DIAGNOSIS — N183 Chronic kidney disease, stage 3 unspecified: Secondary | ICD-10-CM | POA: Diagnosis not present

## 2022-12-07 DIAGNOSIS — N289 Disorder of kidney and ureter, unspecified: Secondary | ICD-10-CM | POA: Diagnosis not present

## 2022-12-07 DIAGNOSIS — I10 Essential (primary) hypertension: Secondary | ICD-10-CM | POA: Diagnosis not present

## 2022-12-07 DIAGNOSIS — R0602 Shortness of breath: Secondary | ICD-10-CM | POA: Diagnosis not present

## 2022-12-07 DIAGNOSIS — R7401 Elevation of levels of liver transaminase levels: Secondary | ICD-10-CM | POA: Diagnosis not present

## 2022-12-11 ENCOUNTER — Encounter (HOSPITAL_COMMUNITY): Payer: Self-pay

## 2022-12-11 DIAGNOSIS — Z79899 Other long term (current) drug therapy: Secondary | ICD-10-CM | POA: Diagnosis not present

## 2022-12-11 DIAGNOSIS — R531 Weakness: Secondary | ICD-10-CM | POA: Diagnosis not present

## 2022-12-11 DIAGNOSIS — R110 Nausea: Secondary | ICD-10-CM | POA: Diagnosis not present

## 2022-12-11 DIAGNOSIS — E871 Hypo-osmolality and hyponatremia: Secondary | ICD-10-CM | POA: Diagnosis not present

## 2022-12-11 DIAGNOSIS — R627 Adult failure to thrive: Secondary | ICD-10-CM | POA: Diagnosis not present

## 2022-12-11 DIAGNOSIS — R1011 Right upper quadrant pain: Secondary | ICD-10-CM | POA: Diagnosis not present

## 2022-12-11 DIAGNOSIS — I10 Essential (primary) hypertension: Secondary | ICD-10-CM | POA: Diagnosis not present

## 2022-12-11 DIAGNOSIS — B179 Acute viral hepatitis, unspecified: Secondary | ICD-10-CM | POA: Diagnosis not present

## 2022-12-11 DIAGNOSIS — N179 Acute kidney failure, unspecified: Secondary | ICD-10-CM | POA: Diagnosis not present

## 2022-12-11 DIAGNOSIS — Z7902 Long term (current) use of antithrombotics/antiplatelets: Secondary | ICD-10-CM | POA: Diagnosis not present

## 2022-12-11 DIAGNOSIS — R059 Cough, unspecified: Secondary | ICD-10-CM | POA: Diagnosis not present

## 2022-12-11 DIAGNOSIS — R9431 Abnormal electrocardiogram [ECG] [EKG]: Secondary | ICD-10-CM | POA: Diagnosis not present

## 2022-12-11 DIAGNOSIS — E860 Dehydration: Secondary | ICD-10-CM | POA: Diagnosis not present

## 2022-12-11 DIAGNOSIS — I447 Left bundle-branch block, unspecified: Secondary | ICD-10-CM | POA: Diagnosis not present

## 2022-12-11 DIAGNOSIS — I498 Other specified cardiac arrhythmias: Secondary | ICD-10-CM | POA: Diagnosis not present

## 2022-12-12 DIAGNOSIS — E861 Hypovolemia: Secondary | ICD-10-CM | POA: Diagnosis not present

## 2022-12-12 DIAGNOSIS — Z7409 Other reduced mobility: Secondary | ICD-10-CM | POA: Diagnosis not present

## 2022-12-12 DIAGNOSIS — I340 Nonrheumatic mitral (valve) insufficiency: Secondary | ICD-10-CM | POA: Diagnosis not present

## 2022-12-12 DIAGNOSIS — I255 Ischemic cardiomyopathy: Secondary | ICD-10-CM | POA: Diagnosis not present

## 2022-12-12 DIAGNOSIS — Z950 Presence of cardiac pacemaker: Secondary | ICD-10-CM | POA: Diagnosis not present

## 2022-12-12 DIAGNOSIS — Z955 Presence of coronary angioplasty implant and graft: Secondary | ICD-10-CM | POA: Diagnosis not present

## 2022-12-12 DIAGNOSIS — Z66 Do not resuscitate: Secondary | ICD-10-CM | POA: Diagnosis not present

## 2022-12-12 DIAGNOSIS — B179 Acute viral hepatitis, unspecified: Secondary | ICD-10-CM | POA: Diagnosis not present

## 2022-12-12 DIAGNOSIS — Z7901 Long term (current) use of anticoagulants: Secondary | ICD-10-CM | POA: Diagnosis not present

## 2022-12-12 DIAGNOSIS — E039 Hypothyroidism, unspecified: Secondary | ICD-10-CM | POA: Diagnosis not present

## 2022-12-12 DIAGNOSIS — R531 Weakness: Secondary | ICD-10-CM | POA: Diagnosis not present

## 2022-12-12 DIAGNOSIS — R54 Age-related physical debility: Secondary | ICD-10-CM | POA: Diagnosis not present

## 2022-12-12 DIAGNOSIS — Z6821 Body mass index (BMI) 21.0-21.9, adult: Secondary | ICD-10-CM | POA: Diagnosis not present

## 2022-12-12 DIAGNOSIS — K7689 Other specified diseases of liver: Secondary | ICD-10-CM | POA: Diagnosis not present

## 2022-12-12 DIAGNOSIS — R748 Abnormal levels of other serum enzymes: Secondary | ICD-10-CM | POA: Diagnosis not present

## 2022-12-12 DIAGNOSIS — E871 Hypo-osmolality and hyponatremia: Secondary | ICD-10-CM | POA: Diagnosis not present

## 2022-12-12 DIAGNOSIS — Z7401 Bed confinement status: Secondary | ICD-10-CM | POA: Diagnosis not present

## 2022-12-12 DIAGNOSIS — I959 Hypotension, unspecified: Secondary | ICD-10-CM | POA: Diagnosis not present

## 2022-12-12 DIAGNOSIS — Z87891 Personal history of nicotine dependence: Secondary | ICD-10-CM | POA: Diagnosis not present

## 2022-12-12 DIAGNOSIS — I371 Nonrheumatic pulmonary valve insufficiency: Secondary | ICD-10-CM | POA: Diagnosis not present

## 2022-12-12 DIAGNOSIS — I130 Hypertensive heart and chronic kidney disease with heart failure and stage 1 through stage 4 chronic kidney disease, or unspecified chronic kidney disease: Secondary | ICD-10-CM | POA: Diagnosis not present

## 2022-12-12 DIAGNOSIS — I495 Sick sinus syndrome: Secondary | ICD-10-CM | POA: Diagnosis not present

## 2022-12-12 DIAGNOSIS — R627 Adult failure to thrive: Secondary | ICD-10-CM | POA: Diagnosis not present

## 2022-12-12 DIAGNOSIS — K5900 Constipation, unspecified: Secondary | ICD-10-CM | POA: Diagnosis not present

## 2022-12-12 DIAGNOSIS — R7989 Other specified abnormal findings of blood chemistry: Secondary | ICD-10-CM | POA: Diagnosis not present

## 2022-12-12 DIAGNOSIS — N1831 Chronic kidney disease, stage 3a: Secondary | ICD-10-CM | POA: Diagnosis not present

## 2022-12-12 DIAGNOSIS — E782 Mixed hyperlipidemia: Secondary | ICD-10-CM | POA: Diagnosis not present

## 2022-12-12 DIAGNOSIS — R9430 Abnormal result of cardiovascular function study, unspecified: Secondary | ICD-10-CM | POA: Diagnosis not present

## 2022-12-12 DIAGNOSIS — E860 Dehydration: Secondary | ICD-10-CM | POA: Diagnosis not present

## 2022-12-12 DIAGNOSIS — K759 Inflammatory liver disease, unspecified: Secondary | ICD-10-CM | POA: Diagnosis not present

## 2022-12-12 DIAGNOSIS — N179 Acute kidney failure, unspecified: Secondary | ICD-10-CM | POA: Diagnosis not present

## 2022-12-12 DIAGNOSIS — I517 Cardiomegaly: Secondary | ICD-10-CM | POA: Diagnosis not present

## 2022-12-12 DIAGNOSIS — I2510 Atherosclerotic heart disease of native coronary artery without angina pectoris: Secondary | ICD-10-CM | POA: Diagnosis not present

## 2022-12-12 DIAGNOSIS — Z79899 Other long term (current) drug therapy: Secondary | ICD-10-CM | POA: Diagnosis not present

## 2022-12-12 DIAGNOSIS — K5730 Diverticulosis of large intestine without perforation or abscess without bleeding: Secondary | ICD-10-CM | POA: Diagnosis not present

## 2022-12-12 DIAGNOSIS — Z7902 Long term (current) use of antithrombotics/antiplatelets: Secondary | ICD-10-CM | POA: Diagnosis not present

## 2022-12-12 DIAGNOSIS — N200 Calculus of kidney: Secondary | ICD-10-CM | POA: Diagnosis not present

## 2022-12-12 DIAGNOSIS — D631 Anemia in chronic kidney disease: Secondary | ICD-10-CM | POA: Diagnosis not present

## 2022-12-13 DIAGNOSIS — R627 Adult failure to thrive: Secondary | ICD-10-CM | POA: Diagnosis not present

## 2022-12-13 DIAGNOSIS — K759 Inflammatory liver disease, unspecified: Secondary | ICD-10-CM | POA: Diagnosis not present

## 2022-12-13 DIAGNOSIS — R7989 Other specified abnormal findings of blood chemistry: Secondary | ICD-10-CM | POA: Diagnosis not present

## 2022-12-13 DIAGNOSIS — R531 Weakness: Secondary | ICD-10-CM | POA: Diagnosis not present

## 2022-12-14 DIAGNOSIS — R627 Adult failure to thrive: Secondary | ICD-10-CM | POA: Diagnosis not present

## 2022-12-14 DIAGNOSIS — R531 Weakness: Secondary | ICD-10-CM | POA: Diagnosis not present

## 2022-12-14 DIAGNOSIS — R7989 Other specified abnormal findings of blood chemistry: Secondary | ICD-10-CM | POA: Diagnosis not present

## 2022-12-14 DIAGNOSIS — N1831 Chronic kidney disease, stage 3a: Secondary | ICD-10-CM | POA: Diagnosis not present

## 2022-12-15 DIAGNOSIS — Z7409 Other reduced mobility: Secondary | ICD-10-CM | POA: Diagnosis not present

## 2022-12-16 DIAGNOSIS — Z7409 Other reduced mobility: Secondary | ICD-10-CM | POA: Diagnosis not present

## 2022-12-22 DIAGNOSIS — N1831 Chronic kidney disease, stage 3a: Secondary | ICD-10-CM | POA: Diagnosis not present

## 2022-12-22 DIAGNOSIS — Z950 Presence of cardiac pacemaker: Secondary | ICD-10-CM | POA: Diagnosis not present

## 2022-12-22 DIAGNOSIS — I2510 Atherosclerotic heart disease of native coronary artery without angina pectoris: Secondary | ICD-10-CM | POA: Diagnosis not present

## 2022-12-22 DIAGNOSIS — C7951 Secondary malignant neoplasm of bone: Secondary | ICD-10-CM | POA: Diagnosis not present

## 2022-12-22 DIAGNOSIS — C228 Malignant neoplasm of liver, primary, unspecified as to type: Secondary | ICD-10-CM | POA: Diagnosis not present

## 2022-12-22 DIAGNOSIS — I130 Hypertensive heart and chronic kidney disease with heart failure and stage 1 through stage 4 chronic kidney disease, or unspecified chronic kidney disease: Secondary | ICD-10-CM | POA: Diagnosis not present

## 2022-12-22 DIAGNOSIS — I5022 Chronic systolic (congestive) heart failure: Secondary | ICD-10-CM | POA: Diagnosis not present

## 2022-12-22 DIAGNOSIS — J849 Interstitial pulmonary disease, unspecified: Secondary | ICD-10-CM | POA: Diagnosis not present

## 2022-12-23 DIAGNOSIS — C228 Malignant neoplasm of liver, primary, unspecified as to type: Secondary | ICD-10-CM | POA: Diagnosis not present

## 2022-12-23 DIAGNOSIS — N1831 Chronic kidney disease, stage 3a: Secondary | ICD-10-CM | POA: Diagnosis not present

## 2022-12-23 DIAGNOSIS — I5022 Chronic systolic (congestive) heart failure: Secondary | ICD-10-CM | POA: Diagnosis not present

## 2022-12-23 DIAGNOSIS — I130 Hypertensive heart and chronic kidney disease with heart failure and stage 1 through stage 4 chronic kidney disease, or unspecified chronic kidney disease: Secondary | ICD-10-CM | POA: Diagnosis not present

## 2022-12-23 DIAGNOSIS — I2510 Atherosclerotic heart disease of native coronary artery without angina pectoris: Secondary | ICD-10-CM | POA: Diagnosis not present

## 2022-12-23 DIAGNOSIS — C7951 Secondary malignant neoplasm of bone: Secondary | ICD-10-CM | POA: Diagnosis not present

## 2022-12-25 DIAGNOSIS — C228 Malignant neoplasm of liver, primary, unspecified as to type: Secondary | ICD-10-CM | POA: Diagnosis not present

## 2022-12-25 DIAGNOSIS — N1831 Chronic kidney disease, stage 3a: Secondary | ICD-10-CM | POA: Diagnosis not present

## 2022-12-25 DIAGNOSIS — I130 Hypertensive heart and chronic kidney disease with heart failure and stage 1 through stage 4 chronic kidney disease, or unspecified chronic kidney disease: Secondary | ICD-10-CM | POA: Diagnosis not present

## 2022-12-25 DIAGNOSIS — I5022 Chronic systolic (congestive) heart failure: Secondary | ICD-10-CM | POA: Diagnosis not present

## 2022-12-25 DIAGNOSIS — I2510 Atherosclerotic heart disease of native coronary artery without angina pectoris: Secondary | ICD-10-CM | POA: Diagnosis not present

## 2022-12-25 DIAGNOSIS — C7951 Secondary malignant neoplasm of bone: Secondary | ICD-10-CM | POA: Diagnosis not present

## 2023-01-04 DEATH — deceased
